# Patient Record
Sex: Female | Born: 1945 | Race: White | Hispanic: No | Marital: Married | Smoking: Never smoker
Health system: Southern US, Community
[De-identification: ages and names within clinical notes are randomized; demographics above are authoritative.]

## PROBLEM LIST (undated history)

## (undated) DIAGNOSIS — I2510 Atherosclerotic heart disease of native coronary artery without angina pectoris: Secondary | ICD-10-CM

## (undated) DIAGNOSIS — I82409 Acute embolism and thrombosis of unspecified deep veins of unspecified lower extremity: Secondary | ICD-10-CM

## (undated) DIAGNOSIS — E785 Hyperlipidemia, unspecified: Secondary | ICD-10-CM

## (undated) DIAGNOSIS — I10 Essential (primary) hypertension: Secondary | ICD-10-CM

## (undated) HISTORY — PX: CHOLECYSTECTOMY: SHX55

---

## 1994-04-25 HISTORY — PX: BREAST BIOPSY: SHX20

## 2005-01-09 ENCOUNTER — Other Ambulatory Visit: Payer: Self-pay

## 2005-01-09 ENCOUNTER — Inpatient Hospital Stay: Payer: Self-pay | Admitting: Internal Medicine

## 2005-01-10 ENCOUNTER — Other Ambulatory Visit: Payer: Self-pay

## 2005-01-26 ENCOUNTER — Ambulatory Visit: Payer: Self-pay | Admitting: Cardiovascular Disease

## 2005-06-14 ENCOUNTER — Ambulatory Visit: Payer: Self-pay | Admitting: Internal Medicine

## 2006-03-24 ENCOUNTER — Other Ambulatory Visit: Payer: Self-pay

## 2006-03-24 ENCOUNTER — Inpatient Hospital Stay: Payer: Self-pay | Admitting: Surgery

## 2006-06-22 ENCOUNTER — Ambulatory Visit: Payer: Self-pay | Admitting: Internal Medicine

## 2006-12-13 ENCOUNTER — Ambulatory Visit: Payer: Self-pay | Admitting: Gastroenterology

## 2007-06-26 ENCOUNTER — Ambulatory Visit: Payer: Self-pay | Admitting: Internal Medicine

## 2008-06-26 ENCOUNTER — Ambulatory Visit: Payer: Self-pay | Admitting: Internal Medicine

## 2009-08-25 ENCOUNTER — Ambulatory Visit: Payer: Self-pay | Admitting: Internal Medicine

## 2009-10-23 ENCOUNTER — Ambulatory Visit: Payer: Self-pay | Admitting: Internal Medicine

## 2009-10-24 ENCOUNTER — Inpatient Hospital Stay: Payer: Self-pay | Admitting: Internal Medicine

## 2010-04-21 ENCOUNTER — Inpatient Hospital Stay: Payer: Self-pay | Admitting: Internal Medicine

## 2010-04-25 HISTORY — PX: COLON SURGERY: SHX602

## 2010-06-09 ENCOUNTER — Ambulatory Visit: Payer: Self-pay | Admitting: Gastroenterology

## 2010-06-22 ENCOUNTER — Ambulatory Visit: Payer: Self-pay | Admitting: Emergency Medicine

## 2010-06-29 ENCOUNTER — Inpatient Hospital Stay: Payer: Self-pay | Admitting: Emergency Medicine

## 2010-06-30 LAB — PATHOLOGY REPORT

## 2010-09-09 ENCOUNTER — Ambulatory Visit: Payer: Self-pay | Admitting: Internal Medicine

## 2010-09-17 ENCOUNTER — Inpatient Hospital Stay: Payer: Self-pay | Admitting: Internal Medicine

## 2010-10-25 ENCOUNTER — Ambulatory Visit: Payer: Self-pay | Admitting: Internal Medicine

## 2010-10-27 ENCOUNTER — Emergency Department: Payer: Self-pay | Admitting: Emergency Medicine

## 2011-02-17 ENCOUNTER — Ambulatory Visit: Payer: Self-pay | Admitting: Internal Medicine

## 2011-04-18 ENCOUNTER — Ambulatory Visit: Payer: Self-pay | Admitting: Internal Medicine

## 2011-09-16 ENCOUNTER — Ambulatory Visit: Payer: Self-pay | Admitting: Internal Medicine

## 2011-11-16 ENCOUNTER — Ambulatory Visit: Payer: Self-pay | Admitting: Urology

## 2011-11-18 ENCOUNTER — Ambulatory Visit: Payer: Self-pay | Admitting: Internal Medicine

## 2011-11-18 LAB — RETICULOCYTES
Absolute Retic Count: 0.221 10*6/uL — ABNORMAL HIGH (ref 0.023–0.096)
Reticulocyte: 0.5 % (ref 0.5–2.2)

## 2011-11-18 LAB — CBC CANCER CENTER
Comment - H1-Com1: NORMAL
Eosinophil: 2 %
HCT: 38.5 % (ref 35.0–47.0)
Lymphocytes: 19 %
MCH: 29.5 pg (ref 26.0–34.0)
MCV: 84 fL (ref 80–100)
Monocytes: 6 %
Platelet: 144 x10 3/mm — ABNORMAL LOW (ref 150–440)
RBC: 4.56 10*6/uL (ref 3.80–5.20)
RDW: 12.8 % (ref 11.5–14.5)

## 2011-11-18 LAB — IRON AND TIBC
Iron Saturation: 27 %
Iron: 103 ug/dL (ref 50–170)
Unbound Iron-Bind.Cap.: 274 ug/dL

## 2011-11-18 LAB — FIBRINOGEN: Fibrinogen: 338 mg/dL (ref 210–470)

## 2011-11-18 LAB — APTT: Activated PTT: 44.9 secs — ABNORMAL HIGH (ref 23.6–35.9)

## 2011-11-18 LAB — PROTIME-INR: Prothrombin Time: 24.7 secs — ABNORMAL HIGH (ref 11.5–14.7)

## 2011-11-24 ENCOUNTER — Ambulatory Visit: Payer: Self-pay | Admitting: Internal Medicine

## 2011-12-30 ENCOUNTER — Ambulatory Visit: Payer: Self-pay | Admitting: Internal Medicine

## 2011-12-30 LAB — RETICULOCYTES
Absolute Retic Count: 0.038 10*6/uL (ref 0.023–0.096)
Reticulocyte: 0.9 % (ref 0.5–2.2)

## 2011-12-30 LAB — CBC CANCER CENTER
Basophil #: 0.1 x10 3/mm (ref 0.0–0.1)
Basophil %: 2.6 %
Eosinophil #: 0.1 x10 3/mm (ref 0.0–0.7)
HCT: 37.5 % (ref 35.0–47.0)
HGB: 12.3 g/dL (ref 12.0–16.0)
Lymphocyte %: 24.5 %
Monocyte %: 7.9 %
Neutrophil #: 2.9 x10 3/mm (ref 1.4–6.5)
Platelet: 168 x10 3/mm (ref 150–440)
RDW: 13.4 % (ref 11.5–14.5)
WBC: 4.7 x10 3/mm (ref 3.6–11.0)

## 2012-01-24 ENCOUNTER — Ambulatory Visit: Payer: Self-pay | Admitting: Internal Medicine

## 2012-09-18 ENCOUNTER — Ambulatory Visit: Payer: Self-pay | Admitting: Internal Medicine

## 2012-09-19 ENCOUNTER — Ambulatory Visit: Payer: Self-pay | Admitting: Internal Medicine

## 2012-11-23 ENCOUNTER — Emergency Department: Payer: Self-pay | Admitting: Internal Medicine

## 2012-11-23 LAB — URINALYSIS, COMPLETE
Glucose,UR: NEGATIVE mg/dL (ref 0–75)
Ketone: NEGATIVE
Ph: 6 (ref 4.5–8.0)
Protein: 100
Specific Gravity: 1.008 (ref 1.003–1.030)
Squamous Epithelial: 2
WBC UR: 487 /HPF (ref 0–5)

## 2013-03-26 ENCOUNTER — Ambulatory Visit: Payer: Self-pay | Admitting: Internal Medicine

## 2014-03-28 ENCOUNTER — Ambulatory Visit: Payer: Self-pay | Admitting: Internal Medicine

## 2015-03-23 ENCOUNTER — Other Ambulatory Visit: Payer: Self-pay | Admitting: Internal Medicine

## 2015-03-23 DIAGNOSIS — Z1231 Encounter for screening mammogram for malignant neoplasm of breast: Secondary | ICD-10-CM

## 2015-03-31 ENCOUNTER — Ambulatory Visit
Admission: RE | Admit: 2015-03-31 | Discharge: 2015-03-31 | Disposition: A | Discharge status: Home / Self Care | Payer: Medicare Other | Source: Ambulatory Visit | Attending: Internal Medicine | Admitting: Internal Medicine

## 2015-03-31 ENCOUNTER — Other Ambulatory Visit: Payer: Self-pay | Admitting: Internal Medicine

## 2015-03-31 DIAGNOSIS — Z1231 Encounter for screening mammogram for malignant neoplasm of breast: Secondary | ICD-10-CM

## 2016-02-18 ENCOUNTER — Other Ambulatory Visit: Payer: Self-pay | Admitting: Internal Medicine

## 2016-02-18 DIAGNOSIS — Z1231 Encounter for screening mammogram for malignant neoplasm of breast: Secondary | ICD-10-CM

## 2016-04-01 ENCOUNTER — Ambulatory Visit
Admission: RE | Admit: 2016-04-01 | Discharge: 2016-04-01 | Disposition: A | Discharge status: Home / Self Care | Payer: Medicare Other | Source: Ambulatory Visit | Attending: Internal Medicine | Admitting: Internal Medicine

## 2016-04-01 DIAGNOSIS — Z1231 Encounter for screening mammogram for malignant neoplasm of breast: Secondary | ICD-10-CM | POA: Diagnosis present

## 2016-09-30 ENCOUNTER — Emergency Department: Payer: Medicare Other

## 2016-09-30 ENCOUNTER — Encounter: Payer: Self-pay | Admitting: Emergency Medicine

## 2016-09-30 ENCOUNTER — Emergency Department
Admission: EM | Admit: 2016-09-30 | Discharge: 2016-09-30 | Disposition: A | Discharge status: Home / Self Care | Payer: Medicare Other | Attending: Emergency Medicine | Admitting: Emergency Medicine

## 2016-09-30 DIAGNOSIS — S6991XA Unspecified injury of right wrist, hand and finger(s), initial encounter: Secondary | ICD-10-CM | POA: Diagnosis present

## 2016-09-30 DIAGNOSIS — Z23 Encounter for immunization: Secondary | ICD-10-CM | POA: Insufficient documentation

## 2016-09-30 DIAGNOSIS — W19XXXA Unspecified fall, initial encounter: Secondary | ICD-10-CM

## 2016-09-30 DIAGNOSIS — Y929 Unspecified place or not applicable: Secondary | ICD-10-CM | POA: Insufficient documentation

## 2016-09-30 DIAGNOSIS — W010XXA Fall on same level from slipping, tripping and stumbling without subsequent striking against object, initial encounter: Secondary | ICD-10-CM | POA: Insufficient documentation

## 2016-09-30 DIAGNOSIS — Y998 Other external cause status: Secondary | ICD-10-CM | POA: Insufficient documentation

## 2016-09-30 DIAGNOSIS — Y9389 Activity, other specified: Secondary | ICD-10-CM | POA: Diagnosis not present

## 2016-09-30 DIAGNOSIS — S60511A Abrasion of right hand, initial encounter: Secondary | ICD-10-CM | POA: Diagnosis not present

## 2016-09-30 DIAGNOSIS — I10 Essential (primary) hypertension: Secondary | ICD-10-CM | POA: Diagnosis not present

## 2016-09-30 DIAGNOSIS — I2510 Atherosclerotic heart disease of native coronary artery without angina pectoris: Secondary | ICD-10-CM | POA: Insufficient documentation

## 2016-09-30 DIAGNOSIS — M7918 Myalgia, other site: Secondary | ICD-10-CM

## 2016-09-30 HISTORY — DX: Atherosclerotic heart disease of native coronary artery without angina pectoris: I25.10

## 2016-09-30 HISTORY — DX: Essential (primary) hypertension: I10

## 2016-09-30 HISTORY — DX: Acute embolism and thrombosis of unspecified deep veins of unspecified lower extremity: I82.409

## 2016-09-30 MED ORDER — TETANUS-DIPHTH-ACELL PERTUSSIS 5-2.5-18.5 LF-MCG/0.5 IM SUSP
0.5000 mL | Freq: Once | INTRAMUSCULAR | Status: AC
  Administered 2016-09-30: 0.5 mL via INTRAMUSCULAR
  Filled 2016-09-30: qty 0.5

## 2016-09-30 NOTE — ED Triage Notes (Signed)
Fell from outside porch after tripping over the cat.  C/O right leg pain and right hand laceration.  Bleeding controlled.  Patient fell onto concrete patio.  Ambulatory.  Gait steady. Posture upright and relaxed.

## 2016-09-30 NOTE — ED Provider Notes (Signed)
[facility] Emergency Department Provider Note  ____________________________________________  Time seen: Approximately 5:21 PM  I have reviewed the triage vital signs and the nursing notes.   HISTORY  Chief Complaint Fall    HPI Sherry Olson is a 71 y.o. female that presents to the emergency department with right hip pain, right shoulder pain, and right wrist pain after tripping on her cat and falling.She has been walking normally since fall. She has an abrasion to her right hand. She did not hit her head or lose consciousness. She does take a blood thinner for a previous DVT in her left leg. She denies headache, shortness of breath, chest pain, nausea, vomiting, abdominal pain, back pain.   Past Medical History:  Diagnosis Date  . Coronary artery disease   . DVT (deep venous thrombosis) (HCC)   . Hypertension     There are no active problems to display for this patient.   Past Surgical History:  Procedure Laterality Date  . BREAST BIOPSY Right 1996   negative    Prior to Admission medications   Not on File    Allergies Amiodarone  No family history on file.  Social History Social History  Substance Use Topics  . Smoking status: Never Smoker  . Smokeless tobacco: Never Used  . Alcohol use Not on file     Review of Systems  Constitutional: No fever/chills Cardiovascular: No chest pain. Respiratory: No SOB. Gastrointestinal: No abdominal pain.  No nausea, no vomiting.  Skin: Negative for rash, lacerations, ecchymosis. Positive for abrasion. Neurological: Negative for headaches, numbness or tingling   ____________________________________________   PHYSICAL EXAM:  VITAL SIGNS: ED Triage Vitals [09/30/16 1555]  Enc Vitals Group     BP      Pulse      Resp      Temp      Temp src      SpO2      Weight 134 lb (60.8 kg)     Height 5\' 3"  (1.6 m)     Head Circumference      Peak Flow      Pain Score 4     Pain Loc       Pain Edu?      Excl. in GC?      Constitutional: Alert and oriented. Well appearing and in no acute distress. Eyes: Conjunctivae are normal. PERRL. EOMI. Head: Atraumatic. ENT:      Ears:      Nose: No congestion/rhinnorhea.      Mouth/Throat: Mucous membranes are moist.  Neck: No stridor.  Cardiovascular: Normal rate, regular rhythm.  Good peripheral circulation. Respiratory: Normal respiratory effort without tachypnea or retractions. Lungs CTAB. Good air entry to the bases with no decreased or absent breath sounds. Gastrointestinal: Bowel sounds 4 quadrants. Soft and nontender to palpation. No guarding or rigidity. No palpable masses. No distention.  Musculoskeletal: Full range of motion to all extremities. No gross deformities appreciated. Neurologic:  Normal speech and language. No gross focal neurologic deficits are appreciated.  Skin:  Skin is warm, dry. 1/2 and 1/4 cm abrasions to dorsal side of right hand.   ____________________________________________   LABS (all labs ordered are listed, but only abnormal results are displayed)  Labs Reviewed - No data to display ____________________________________________  EKG   ____________________________________________  RADIOLOGY Lexine BatonI, Loralye Loberg, personally viewed and evaluated these images (plain radiographs) as part of my medical decision making, as well as reviewing the written report by the  radiologist.  Dg Shoulder Right  Result Date: 09/30/2016 CLINICAL DATA:  Larey Seat on front porch today onto RIGHT side injuring RIGHT shoulder, RIGHT wrist and RIGHT hip, diffuse pain at each of those sites, initial encounter EXAM: RIGHT SHOULDER - 2+ VIEW COMPARISON:  None FINDINGS: Osseous demineralization. AC joint alignment normal. No acute fracture, dislocation, or bone destruction. Visualized RIGHT ribs intact. IMPRESSION: No acute osseous abnormalities. Electronically Signed   By: Ulyses Southward M.D.   On: 09/30/2016 17:19   Dg Wrist  Complete Right  Result Date: 09/30/2016 CLINICAL DATA:  Larey Seat on front porch today onto RIGHT side injuring RIGHT shoulder, RIGHT wrist and RIGHT hip, diffuse pain at each of those sites, initial encounter EXAM: RIGHT WRIST - COMPLETE 3+ VIEW COMPARISON:  None FINDINGS: Osseous demineralization. Joint space narrowing at STT joint. Minimal degenerative changes first CMC joint as well. No acute fracture, dislocation, or bone destruction. IMPRESSION: Osseous demineralization with mild degenerative changes at the radial border of the carpus. No acute abnormalities. Electronically Signed   By: Ulyses Southward M.D.   On: 09/30/2016 17:19   Dg Hip Unilat W Or Wo Pelvis 2-3 Views Right  Result Date: 09/30/2016 CLINICAL DATA:  Larey Seat on front porch today onto RIGHT side injuring RIGHT shoulder, RIGHT wrist and RIGHT hip, diffuse pain at each of those sites, initial encounter EXAM: DG HIP (WITH OR WITHOUT PELVIS) 2-3V RIGHT COMPARISON:  None FINDINGS: Hip and SI joint spaces symmetric. Bones appear demineralized. No acute fracture, dislocation, or bone destruction. Disc space narrowing L4-L5. Anastomotic staple lines in the pelvis. IMPRESSION: No acute RIGHT hip abnormalities. Electronically Signed   By: Ulyses Southward M.D.   On: 09/30/2016 17:20    ____________________________________________    PROCEDURES  Procedure(s) performed:    Procedures  Abrasions were cleaned with normal saline and iodine. Dermabond was applied. Hand was wrapped.  Medications  Tdap (BOOSTRIX) injection 0.5 mL (0.5 mLs Intramuscular Given 09/30/16 1742)     ____________________________________________   INITIAL IMPRESSION / ASSESSMENT AND PLAN / ED COURSE  Pertinent labs & imaging results that were available during my care of the patient were reviewed by me and considered in my medical decision making (see chart for details).  Review of the [place] CSRS was performed in accordance of the [facility] prior to dispensing any controlled  drugs.   Patient's diagnosis is consistent with musculoskeletal pain and abrasion after fall. Vital signs and exam are reassuring. Right shoulder, right wrist, right hip x-rays negative for acute bony abnormalities. Patient did not hit head or lose consciousness. Abrasion was repaired with dermabond. Tetanus shot was updated. Patient is to follow up with PCP as directed. Patient is given ED precautions to return to the ED for any worsening or new symptoms.     ____________________________________________  FINAL CLINICAL IMPRESSION(S) / ED DIAGNOSES  Final diagnoses:  Fall, initial encounter  Abrasion of right hand, initial encounter  Musculoskeletal pain      NEW MEDICATIONS STARTED DURING THIS VISIT:  There are no discharge medications for this patient.       This chart was dictated using voice recognition software/Dragon. Despite best efforts to proofread, errors can occur which can change the meaning. Any change was purely unintentional.    Enid Derry, PA-C 09/30/16 1841    Jene Every, MD 10/04/16 [phone]

## 2016-09-30 NOTE — ED Notes (Signed)
Pt reports that she fell today when she tripped over the cat and fell on the porch - pt hit cement - she injured right hand/elbow/leg - pt is able to bear weight

## 2017-02-20 ENCOUNTER — Other Ambulatory Visit: Payer: Self-pay | Admitting: Internal Medicine

## 2017-02-20 DIAGNOSIS — Z1231 Encounter for screening mammogram for malignant neoplasm of breast: Secondary | ICD-10-CM

## 2017-04-27 ENCOUNTER — Ambulatory Visit
Admission: RE | Admit: 2017-04-27 | Discharge: 2017-04-27 | Disposition: A | Discharge status: Home / Self Care | Payer: Medicare Other | Source: Ambulatory Visit | Attending: Internal Medicine | Admitting: Internal Medicine

## 2017-04-27 DIAGNOSIS — Z1231 Encounter for screening mammogram for malignant neoplasm of breast: Secondary | ICD-10-CM | POA: Insufficient documentation

## 2017-07-06 ENCOUNTER — Other Ambulatory Visit: Payer: Self-pay | Admitting: Internal Medicine

## 2017-07-06 DIAGNOSIS — O926 Galactorrhea: Secondary | ICD-10-CM

## 2017-07-10 ENCOUNTER — Other Ambulatory Visit: Payer: Medicare Other

## 2017-07-11 ENCOUNTER — Ambulatory Visit
Admission: RE | Admit: 2017-07-11 | Discharge: 2017-07-11 | Disposition: A | Discharge status: Home / Self Care | Payer: Medicare Other | Source: Ambulatory Visit | Attending: Internal Medicine | Admitting: Internal Medicine

## 2017-07-11 DIAGNOSIS — O926 Galactorrhea: Secondary | ICD-10-CM

## 2017-07-11 DIAGNOSIS — N643 Galactorrhea not associated with childbirth: Secondary | ICD-10-CM | POA: Insufficient documentation

## 2017-10-20 ENCOUNTER — Other Ambulatory Visit: Payer: Self-pay | Admitting: Internal Medicine

## 2017-10-20 DIAGNOSIS — I82402 Acute embolism and thrombosis of unspecified deep veins of left lower extremity: Secondary | ICD-10-CM

## 2017-10-25 ENCOUNTER — Ambulatory Visit
Admission: RE | Admit: 2017-10-25 | Discharge: 2017-10-25 | Disposition: A | Discharge status: Home / Self Care | Payer: Medicare Other | Source: Ambulatory Visit | Attending: Internal Medicine | Admitting: Internal Medicine

## 2017-10-25 DIAGNOSIS — I82402 Acute embolism and thrombosis of unspecified deep veins of left lower extremity: Secondary | ICD-10-CM | POA: Insufficient documentation

## 2017-11-10 ENCOUNTER — Ambulatory Visit (INDEPENDENT_AMBULATORY_CARE_PROVIDER_SITE_OTHER): Payer: Medicare Other | Admitting: Vascular Surgery

## 2017-11-10 ENCOUNTER — Encounter (INDEPENDENT_AMBULATORY_CARE_PROVIDER_SITE_OTHER): Payer: Self-pay | Admitting: Vascular Surgery

## 2017-11-10 VITALS — BP 112/64 | HR 63 | Resp 16 | Ht 63.0 in | Wt 132.0 lb

## 2017-11-10 DIAGNOSIS — I82409 Acute embolism and thrombosis of unspecified deep veins of unspecified lower extremity: Secondary | ICD-10-CM | POA: Insufficient documentation

## 2017-11-10 DIAGNOSIS — I82512 Chronic embolism and thrombosis of left femoral vein: Secondary | ICD-10-CM

## 2017-11-10 DIAGNOSIS — I10 Essential (primary) hypertension: Secondary | ICD-10-CM

## 2017-11-10 NOTE — Progress Notes (Signed)
Patient ID: Sherry Olson, female   DOB: 05/31/1945, 72 y.o.   MRN: [id number]  Chief Complaint  Patient presents with  . New Patient (Initial Visit)    history of DVT    HPI Sherry Olson is a 72 y.o. female.  Patient present for evaluation of DVT.  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  She has been on Coumadin since that time.  She also had a provoked DVT about 45 years ago with pregnancy.  She does have a family history of DVT with a father and an aunt who had a DVT many years ago.  She denies any current leg symptoms.  She is on a diuretic but has no leg swelling or pain.  She wears her stockings daily.  No chest pain or shortness of breath.  She has had some bleeding difficulties from her breast and had to have a mammogram to evaluate this earlier this year.  A recent venous duplex revealed no evidence of acute DVT with only chronic scarring in the left common femoral vein as sequela from her previous blood clot.  Current Outpatient Medications  Medication Sig Dispense Refill  . amLODipine (NORVASC) 5 MG tablet Take 5 mg by mouth daily.    Marland Kitchen. atorvastatin (LIPITOR) 10 MG tablet Take 10 mg by mouth daily.    . benazepril (LOTENSIN) 20 MG tablet Take 20 mg by mouth daily.    . Calcium Carbonate (CALCIUM 600 PO) Take by mouth daily.    . cholecalciferol (VITAMIN D) 1000 units tablet Take 1,000 Units by mouth daily.    Marland Kitchen. CITALOPRAM HYDROBROMIDE PO Take by mouth.    . dicyclomine (BENTYL) 10 MG capsule Take 10 mg by mouth 4 (four) times daily -  before meals and at bedtime.    Marland Kitchen. FLUTICASONE PROPIONATE EX Apply topically.    . furosemide (LASIX) 20 MG tablet Take 20 mg by mouth daily.    Marland Kitchen. LORazepam (ATIVAN) 0.5 MG tablet Take 0.5 mg by mouth as needed for anxiety.    . methylPREDNISolone (MEDROL DOSEPAK) 4 MG TBPK tablet Take 4 mg by mouth daily.    Marland Kitchen. METOPROLOL SUCCINATE PO Take by mouth.    Marland Kitchen. omeprazole (PRILOSEC) 40 MG capsule Take 40 mg by mouth  daily.    Marland Kitchen. warfarin (COUMADIN) 1 MG tablet Take 1 mg by mouth daily.    Marland Kitchen. warfarin (COUMADIN) 4 MG tablet Take 4 mg by mouth daily.     No current facility-administered medications for this visit.      Past Medical History:  Diagnosis Date  . Coronary artery disease   . DVT (deep venous thrombosis) (HCC)   . Hypertension     Past Surgical History:  Procedure Laterality Date  . BREAST BIOPSY Right 1996   negative    Family History  Problem Relation Age of Onset  . Hypertension Father   . Deep vein thrombosis Father   . Heart attack Father   no bleeding disorder No aneurysms  Social History Social History   Tobacco Use  . Smoking status: Never Smoker  . Smokeless tobacco: Never Used  Substance Use Topics  . Alcohol use: Never    Frequency: Never  . Drug use: Never     Allergies  Allergen Reactions  . Amiodarone   . Welchol [Colesevelam Hcl] Diarrhea and Nausea And Vomiting        REVIEW OF SYSTEMS (Negative unless checked)  Constitutional: [] Weight loss  [] Fever  [] Chills Cardiac: [] Chest pain   [] Chest pressure   [] Palpitations   [] Shortness of breath when laying flat   [] Shortness of breath at rest   [] Shortness of breath with exertion. Vascular:  [] Pain in legs with walking   [] Pain in legs at rest   [] Pain in legs when laying flat   [] Claudication   [] Pain in feet when walking  [] Pain in feet at rest  [] Pain in feet when laying flat   [x] History of DVT   [] Phlebitis   [x] Swelling in legs   [] Varicose veins   [] Non-healing ulcers Pulmonary:   [] Uses home oxygen   [] Productive cough   [] Hemoptysis   [] Wheeze  [] COPD   [] Asthma Neurologic:  [] Dizziness  [] Blackouts   [] Seizures   [] History of stroke   [] History of TIA  [] Aphasia   [] Temporary blindness   [] Dysphagia   [] Weakness or numbness in arms   [] Weakness or numbness in legs Musculoskeletal:  [] Arthritis   [] Joint swelling   [] Joint pain   [] Low back pain Hematologic:  [] Easy bruising  [] Easy bleeding    [] Hypercoagulable state   [] Anemic  [] Hepatitis  Gastrointestinal:  [] Blood in stool   [] Vomiting blood  [] Gastroesophageal reflux/heartburn   [] Difficulty swallowing   Genitourinary:  [] Chronic kidney disease   [] Difficult urination  [] Frequent urination  [] Burning with urination   [] Blood in urine Skin:  [] Rashes   [] Ulcers   [] Wounds Psychological:  [] History of anxiety   []  History of major depression.  Physical Exam BP 112/64 (BP Location: Right Arm)   Pulse 63   Resp 16   Ht 5\' 3"  (1.6 m)   Wt 132 lb (59.9 kg)   BMI 23.38 kg/m   Gen:  WD/WN, NAD.  Appears younger than stated age Head: [place]/AT, No temporalis wasting.  Ear/Nose/Throat: Hearing grossly intact, nares w/o erythema or drainage, oropharynx w/o Erythema/Exudate Eyes: Sclera non-icteric, conjunctiva clear Neck: Trachea midline.  No JVD.  Pulmonary:  Good air movement, no use of accessory muscles.  Cardiac: RRR, normal S1, S2. Vascular:  Vessel Right Left  Radial Palpable Palpable                          PT Palpable Palpable  DP Palpable Palpable   Gastrointestinal: soft, non-tender/non-distended. No guarding/reflex.  Musculoskeletal: M/S 5/5 throughout.  Extremities without ischemic changes.  No deformity or atrophy.  No edema.  Neurologic:  Sensation grossly intact in extremities.  Symmetrical.  Speech is fluent. Motor exam as listed above. Psychiatric: Judgment intact, Mood & affect appropriate for pt's clinical situation. Dermatologic: No rashes or ulcers noted.  No cellulitis or open wounds.    Radiology US Venous Img Lower Unilateral Left  Result Date: 10/25/2017 CLINICAL DATA:  72 year old female with a history of DVT. EXAM: LEFT LOWER EXTREMITY VENOUS DOPPLER ULTRASOUND TECHNIQUE: Gray-scale sonography with graded compression, as well as color Doppler and duplex ultrasound were performed to evaluate the lower extremity deep venous systems from the level of the common femoral vein and including the  common femoral, femoral, profunda femoral, popliteal and calf veins including the posterior tibial, peroneal and gastrocnemius veins when visible. The superficial great saphenous vein was also interrogated. Spectral Doppler was utilized to evaluate flow at rest and with distal augmentation maneuvers in the common femoral, femoral and popliteal veins. COMPARISON:  09/17/2010 FINDINGS: Contralateral Common Femoral Vein: Respiratory phasicity is normal and symmetric with the symptomatic side. No evidence of  thrombus. Normal compressibility. Common Femoral Vein: No evidence of thrombus. Incomplete compressibility., flow and respiratory phasicity maintained. Wall thickening likely represents chronic thrombus/scar. Saphenofemoral Junction: No thrombus identified. Incomplete compressibility. Flow and respiratory phasicity maintained. Profunda Femoral Vein: No evidence of thrombus. Normal compressibility and flow on color Doppler imaging. Femoral Vein: No evidence of thrombus. Normal compressibility, respiratory phasicity and response to augmentation. Popliteal Vein: No evidence of thrombus. Normal compressibility, respiratory phasicity and response to augmentation. Calf Veins: No evidence of thrombus. Normal compressibility and flow on color Doppler imaging. Superficial Great Saphenous Vein: No evidence of thrombus. Normal compressibility and flow on color Doppler imaging. Other Findings:  None. IMPRESSION: Sonographic survey of the left lower extremity negative for acute DVT. Thickening of the left common femoral vein wall with incomplete compressibility likely reflects chronic changes of prior DVT. Electronically Signed   By: Gilmer Mor D.O.   On: 10/25/2017 15:46    Labs No results found for this or any previous visit (from the past 2160 hour(s)).  Assessment/Plan:  Hypertension blood pressure control important in reducing the progression of atherosclerotic disease. On appropriate oral medications.   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  She is received adequate therapy with full anticoagulation, and at this point it would certainly be reasonable to switch to an aspirin daily.  She should continue wear compression stockings and elevate her legs.  Given her bleeding issues from her breast, I think stopping anticoagulation at this time would be in her best interest.  She has a small but real risk of recurrent DVT and she needs to be cognizant and aware if she has any leg swelling to consider a venous duplex.  I will see her back as needed.     Festus Barren 11/10/2017, 12:19 PM   This note was created with [facility] medical dictation system.  Any errors from dictation are unintentional.

## 2017-11-10 NOTE — Assessment & Plan Note (Signed)
blood pressure control important in reducing the progression of atherosclerotic disease. On appropriate oral medications.  

## 2017-11-10 NOTE — Assessment & Plan Note (Signed)
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  She is received adequate therapy with full anticoagulation, and at this point it would certainly be reasonable to switch to an aspirin daily.  She should continue wear compression stockings and elevate her legs.  Given her bleeding issues from her breast, I think stopping anticoagulation at this time would be in her best interest.  She has a small but real risk of recurrent DVT and she needs to be cognizant and aware if she has any leg swelling to consider a venous duplex.  I will see her back as needed.

## 2018-03-27 ENCOUNTER — Other Ambulatory Visit: Payer: Self-pay | Admitting: Internal Medicine

## 2018-03-27 DIAGNOSIS — Z1231 Encounter for screening mammogram for malignant neoplasm of breast: Secondary | ICD-10-CM

## 2018-04-30 ENCOUNTER — Ambulatory Visit
Admission: RE | Admit: 2018-04-30 | Discharge: 2018-04-30 | Disposition: A | Discharge status: Home / Self Care | Payer: Medicare Other | Source: Ambulatory Visit | Attending: Internal Medicine | Admitting: Internal Medicine

## 2018-04-30 DIAGNOSIS — Z1231 Encounter for screening mammogram for malignant neoplasm of breast: Secondary | ICD-10-CM | POA: Diagnosis present

## 2018-11-23 ENCOUNTER — Other Ambulatory Visit: Payer: Medicare Other | Attending: Internal Medicine

## 2018-12-13 ENCOUNTER — Emergency Department: Payer: Medicare Other

## 2018-12-13 ENCOUNTER — Emergency Department
Admission: EM | Admit: 2018-12-13 | Discharge: 2018-12-13 | Disposition: A | Discharge status: Home / Self Care | Payer: Medicare Other | Attending: Emergency Medicine | Admitting: Emergency Medicine

## 2018-12-13 ENCOUNTER — Other Ambulatory Visit: Payer: Self-pay

## 2018-12-13 DIAGNOSIS — Y92812 Truck as the place of occurrence of the external cause: Secondary | ICD-10-CM | POA: Diagnosis not present

## 2018-12-13 DIAGNOSIS — I259 Chronic ischemic heart disease, unspecified: Secondary | ICD-10-CM | POA: Diagnosis not present

## 2018-12-13 DIAGNOSIS — I10 Essential (primary) hypertension: Secondary | ICD-10-CM | POA: Insufficient documentation

## 2018-12-13 DIAGNOSIS — Z79899 Other long term (current) drug therapy: Secondary | ICD-10-CM | POA: Insufficient documentation

## 2018-12-13 DIAGNOSIS — I82409 Acute embolism and thrombosis of unspecified deep veins of unspecified lower extremity: Secondary | ICD-10-CM | POA: Insufficient documentation

## 2018-12-13 DIAGNOSIS — Y9389 Activity, other specified: Secondary | ICD-10-CM | POA: Diagnosis not present

## 2018-12-13 DIAGNOSIS — Z7901 Long term (current) use of anticoagulants: Secondary | ICD-10-CM | POA: Diagnosis not present

## 2018-12-13 DIAGNOSIS — S50312A Abrasion of left elbow, initial encounter: Secondary | ICD-10-CM | POA: Insufficient documentation

## 2018-12-13 DIAGNOSIS — S0990XA Unspecified injury of head, initial encounter: Secondary | ICD-10-CM | POA: Diagnosis present

## 2018-12-13 DIAGNOSIS — W1789XA Other fall from one level to another, initial encounter: Secondary | ICD-10-CM | POA: Insufficient documentation

## 2018-12-13 DIAGNOSIS — W19XXXA Unspecified fall, initial encounter: Secondary | ICD-10-CM

## 2018-12-13 DIAGNOSIS — Y999 Unspecified external cause status: Secondary | ICD-10-CM | POA: Diagnosis not present

## 2018-12-13 MED ORDER — TRAMADOL HCL 50 MG PO TABS: 50.0000 mg | ORAL_TABLET | Freq: Four times a day (QID) | ORAL | 0 refills | Status: AC | PRN

## 2018-12-13 NOTE — ED Provider Notes (Signed)
[facility] Emergency Department Provider Note  ____________________________________________  Time seen: Approximately 10:02 PM  I have reviewed the triage vital signs and the nursing notes.   HISTORY  Chief Complaint Arm Pain    HPI Sherry Olson is a 73 y.o. female with a history of coronary artery disease and hypertension, presents to the emergency department after a fall.  Patient reports that she fell off the back of a truck from a sitting position.  She fell as she was trying to evade dogs that were running up to her.  She reports left elbow pain and overlying abrasion of left elbow.  She denies hitting her head or neck during fall.  She denies vertigo, nausea or current headache.  Patient does state that she has some left lateral thigh pain but has had no difficulty walking.  No numbness or tingling in the upper or lower extremities or subjective weakness.  Her tetanus status is up-to-date.  No other alleviating measures have been attempted.        Past Medical History:  Diagnosis Date  . Coronary artery disease   . DVT (deep venous thrombosis) ([place])   . Hypertension     Patient Active Problem List   Diagnosis Date Noted  . Hypertension 11/10/2017  . DVT (deep venous thrombosis) ([place]) 11/10/2017    Past Surgical History:  Procedure Laterality Date  . BREAST BIOPSY Right 1996   negative    Prior to Admission medications   Medication Sig Start Date End Date Taking? Authorizing Provider  amLODipine (NORVASC) 5 MG tablet Take 5 mg by mouth daily.    [provider]  atorvastatin (LIPITOR) 10 MG tablet Take 10 mg by mouth daily.    [provider]  benazepril (LOTENSIN) 20 MG tablet Take 20 mg by mouth daily.    [provider]  Calcium Carbonate (CALCIUM 600 PO) Take by mouth daily.    [provider]  cholecalciferol (VITAMIN D) 1000 units tablet Take 1,000 Units by mouth daily.    [provider]   CITALOPRAM HYDROBROMIDE PO Take by mouth.    [provider]  dicyclomine (BENTYL) 10 MG capsule Take 10 mg by mouth 4 (four) times daily -  before meals and at bedtime.    [provider]  FLUTICASONE PROPIONATE EX Apply topically.    [provider]  furosemide (LASIX) 20 MG tablet Take 20 mg by mouth daily.    [provider]  LORazepam (ATIVAN) 0.5 MG tablet Take 0.5 mg by mouth as needed for anxiety.    [provider]  methylPREDNISolone (MEDROL DOSEPAK) 4 MG TBPK tablet Take 4 mg by mouth daily.    [provider]  METOPROLOL SUCCINATE PO Take by mouth.    [provider]  omeprazole (PRILOSEC) 40 MG capsule Take 40 mg by mouth daily.    [provider]  traMADol (ULTRAM) 50 MG tablet Take 1 tablet (50 mg total) by mouth every 6 (six) hours as needed for up to 3 days. 12/13/18 12/16/18  Lannie Fields, PA-C  warfarin (COUMADIN) 1 MG tablet Take 1 mg by mouth daily.    [provider]  warfarin (COUMADIN) 4 MG tablet Take 4 mg by mouth daily.    [provider]    Allergies Amiodarone and Welchol [colesevelam hcl]  Family History  Problem Relation Age of Onset  . Hypertension Father   . Deep vein thrombosis Father   . Heart attack  Father     Social History Social History   Tobacco Use  . Smoking status: Never Smoker  . Smokeless tobacco: Never Used  Substance Use Topics  . Alcohol use: Never    Frequency: Never  . Drug use: Never     Review of Systems  Constitutional: No fever/chills Eyes: No visual changes. No discharge ENT: No upper respiratory complaints. Cardiovascular: no chest pain. Respiratory: no cough. No SOB. Gastrointestinal: No abdominal pain.  No nausea, no vomiting.  No diarrhea.  No constipation. Musculoskeletal: Patient has left elbow pain. Skin: Patient has abrasion of left elbow. Neurological: Negative for headaches, focal weakness or  numbness.  ____________________________________________   PHYSICAL EXAM:  VITAL SIGNS: ED Triage Vitals  Enc Vitals Group     BP 12/13/18 1951 (!) 150/50     Pulse Rate 12/13/18 1951 65     Resp 12/13/18 1951 18     Temp 12/13/18 1951 98.9 F (37.2 C)     Temp Source 12/13/18 1951 Oral     SpO2 12/13/18 1951 96 %     Weight 12/13/18 1956 132 lb (59.9 kg)     Height 12/13/18 1956 5\' 3"  (1.6 m)     Head Circumference --      Peak Flow --      Pain Score 12/13/18 1956 7     Pain Loc --      Pain Edu? --      Excl. in GC? --      Constitutional: Alert and oriented. Well appearing and in no acute distress. Eyes: Conjunctivae are normal. PERRL. EOMI. Head: Atraumatic. ENT:      Nose: No congestion/rhinnorhea.      Mouth/Throat: Mucous membranes are moist.  Neck: No stridor.  No cervical spine tenderness to palpation.  Cardiovascular: Normal rate, regular rhythm. Normal S1 and S2.  Good peripheral circulation. Respiratory: Normal respiratory effort without tachypnea or retractions. Lungs CTAB. Good air entry to the bases with no decreased or absent breath sounds. Gastrointestinal: Bowel sounds 4 quadrants. Soft and nontender to palpation. No guarding or rigidity. No palpable masses. No distention. No CVA tenderness. Musculoskeletal: Full range of motion to all extremities. No gross deformities appreciated. Neurologic:  Normal speech and language. No gross focal neurologic deficits are appreciated.  Skin: Patient has small abrasion overlying left elbow. Psychiatric: Mood and affect are normal. Speech and behavior are normal. Patient exhibits appropriate insight and judgement.   ____________________________________________   LABS (all labs ordered are listed, but only abnormal results are displayed)  Labs Reviewed - No data to display ____________________________________________  EKG   ____________________________________________  RADIOLOGY I personally viewed and  evaluated these images as part of my medical decision making, as well as reviewing the written report by the radiologist.  Dg Elbow Complete Left  Result Date: 12/13/2018 CLINICAL DATA:  Fall with elbow pain EXAM: LEFT ELBOW - COMPLETE 3+ VIEW COMPARISON:  None. FINDINGS: No fracture or malalignment. No significant fat pad distention to suggest elbow effusion. IMPRESSION: No acute osseous abnormality Electronically Signed   By: Jasmine PangKim  Fujinaga M.D.   On: 12/13/2018 20:24   Ct Head Wo Contrast  Result Date: 12/13/2018 CLINICAL DATA:  Head trauma. EXAM: CT HEAD WITHOUT CONTRAST TECHNIQUE: Contiguous axial images were obtained from the base of the skull through the vertex without intravenous contrast. COMPARISON:  None. FINDINGS: Brain: No evidence of acute infarction, hemorrhage, hydrocephalus, extra-axial collection or mass lesion/mass effect. Vascular: No hyperdense vessel or unexpected calcification. Skull: Normal.  Negative for fracture or focal lesion. Sinuses/Orbits: No acute finding. Other: None. IMPRESSION: No acute intracranial abnormality Electronically Signed   By: Katherine Mantlehristopher  Green M.D.   On: 12/13/2018 22:25    ____________________________________________    PROCEDURES  Procedure(s) performed:    Procedures    Medications - No data to display   ____________________________________________   INITIAL IMPRESSION / ASSESSMENT AND PLAN / ED COURSE  Pertinent labs & imaging results that were available during my care of the patient were reviewed by me and considered in my medical decision making (see chart for details).  Review of the [place] CSRS was performed in accordance of the [facility] prior to dispensing any controlled drugs.         Assessment and plan Fall Left elbow pain 73 year old female presents to the emergency department with left elbow pain after a fall.  Patient was hypertensive at triage but vital signs were otherwise reassuring.  Neuro exam was within the  parameters are normal and appropriate for age.  Differential diagnosis includes subdural hematoma, subarachnoid hemorrhage, left elbow fracture and abrasion.  CT head revealed no evidence of subdural hematoma or skull fracture.  X-ray examination of the left elbow revealed no acute bony abnormality.  Basic wound care was provided for abrasion overlying left elbow.  Patient was discharged with a short course of tramadol for pain.  She reports that her tetanus status is up-to-date.  All patient questions were answered.   ____________________________________________  FINAL CLINICAL IMPRESSION(S) / ED DIAGNOSES  Final diagnoses:  Fall, initial encounter      NEW MEDICATIONS STARTED DURING THIS VISIT:  ED Discharge Orders         Ordered    traMADol (ULTRAM) 50 MG tablet  Every 6 hours PRN     12/13/18 2240              This chart was dictated using voice recognition software/Dragon. Despite best efforts to proofread, errors can occur which can change the meaning. Any change was purely unintentional.    Orvil FeilWoods, Kloie Whiting M, PA-C 12/13/18 2246    Concha SeFunke, Mary E, MD 12/14/18 [phone]

## 2018-12-13 NOTE — ED Triage Notes (Signed)
Patient left elbow pain, swelling, and abrasion after falling out of back of non-moving truck.

## 2019-03-25 ENCOUNTER — Other Ambulatory Visit: Payer: Self-pay | Admitting: Internal Medicine

## 2019-03-25 DIAGNOSIS — Z1231 Encounter for screening mammogram for malignant neoplasm of breast: Secondary | ICD-10-CM

## 2019-05-02 ENCOUNTER — Ambulatory Visit
Admission: RE | Admit: 2019-05-02 | Discharge: 2019-05-02 | Disposition: A | Discharge status: Home / Self Care | Payer: Medicare Other | Source: Ambulatory Visit | Attending: Internal Medicine | Admitting: Internal Medicine

## 2019-05-02 DIAGNOSIS — Z1231 Encounter for screening mammogram for malignant neoplasm of breast: Secondary | ICD-10-CM | POA: Insufficient documentation

## 2020-02-21 ENCOUNTER — Other Ambulatory Visit: Payer: Self-pay | Admitting: Internal Medicine

## 2020-02-21 DIAGNOSIS — Z1231 Encounter for screening mammogram for malignant neoplasm of breast: Secondary | ICD-10-CM

## 2020-05-04 ENCOUNTER — Other Ambulatory Visit: Payer: Self-pay

## 2020-05-04 ENCOUNTER — Ambulatory Visit
Admission: RE | Admit: 2020-05-04 | Discharge: 2020-05-04 | Disposition: A | Discharge status: Home / Self Care | Payer: Medicare Other | Source: Ambulatory Visit | Attending: Internal Medicine | Admitting: Internal Medicine

## 2020-05-04 DIAGNOSIS — Z1231 Encounter for screening mammogram for malignant neoplasm of breast: Secondary | ICD-10-CM | POA: Diagnosis present

## 2020-10-20 DIAGNOSIS — E782 Mixed hyperlipidemia: Secondary | ICD-10-CM | POA: Diagnosis not present

## 2020-10-20 DIAGNOSIS — I10 Essential (primary) hypertension: Secondary | ICD-10-CM | POA: Diagnosis not present

## 2020-10-30 DIAGNOSIS — E782 Mixed hyperlipidemia: Secondary | ICD-10-CM | POA: Diagnosis not present

## 2020-10-30 DIAGNOSIS — I10 Essential (primary) hypertension: Secondary | ICD-10-CM | POA: Diagnosis not present

## 2020-10-30 DIAGNOSIS — K219 Gastro-esophageal reflux disease without esophagitis: Secondary | ICD-10-CM | POA: Diagnosis not present

## 2020-11-03 DIAGNOSIS — H35372 Puckering of macula, left eye: Secondary | ICD-10-CM | POA: Diagnosis not present

## 2020-11-03 DIAGNOSIS — H2513 Age-related nuclear cataract, bilateral: Secondary | ICD-10-CM | POA: Diagnosis not present

## 2020-12-10 DIAGNOSIS — K219 Gastro-esophageal reflux disease without esophagitis: Secondary | ICD-10-CM | POA: Diagnosis not present

## 2020-12-10 DIAGNOSIS — E782 Mixed hyperlipidemia: Secondary | ICD-10-CM | POA: Diagnosis not present

## 2020-12-10 DIAGNOSIS — I10 Essential (primary) hypertension: Secondary | ICD-10-CM | POA: Diagnosis not present

## 2021-01-05 DIAGNOSIS — Z1211 Encounter for screening for malignant neoplasm of colon: Secondary | ICD-10-CM | POA: Diagnosis not present

## 2021-01-05 DIAGNOSIS — K219 Gastro-esophageal reflux disease without esophagitis: Secondary | ICD-10-CM | POA: Diagnosis not present

## 2021-02-09 DIAGNOSIS — E782 Mixed hyperlipidemia: Secondary | ICD-10-CM | POA: Diagnosis not present

## 2021-02-09 DIAGNOSIS — Z23 Encounter for immunization: Secondary | ICD-10-CM | POA: Diagnosis not present

## 2021-02-09 DIAGNOSIS — I10 Essential (primary) hypertension: Secondary | ICD-10-CM | POA: Diagnosis not present

## 2021-02-09 DIAGNOSIS — K219 Gastro-esophageal reflux disease without esophagitis: Secondary | ICD-10-CM | POA: Diagnosis not present

## 2021-02-25 ENCOUNTER — Encounter: Payer: Self-pay | Admitting: Gastroenterology

## 2021-02-26 ENCOUNTER — Encounter: Payer: Self-pay | Admitting: Gastroenterology

## 2021-02-26 ENCOUNTER — Ambulatory Visit
Admission: RE | Admit: 2021-02-26 | Discharge: 2021-02-26 | Disposition: A | Discharge status: Home / Self Care | Payer: Medicare Other | Attending: Gastroenterology | Admitting: Gastroenterology

## 2021-02-26 ENCOUNTER — Encounter
Admission: RE | Disposition: A | Discharge status: Home / Self Care | Payer: Self-pay | Source: Home / Self Care | Attending: Gastroenterology

## 2021-02-26 ENCOUNTER — Ambulatory Visit: Payer: Medicare Other | Admitting: Certified Registered Nurse Anesthetist

## 2021-02-26 ENCOUNTER — Other Ambulatory Visit: Payer: Self-pay

## 2021-02-26 DIAGNOSIS — K5730 Diverticulosis of large intestine without perforation or abscess without bleeding: Secondary | ICD-10-CM | POA: Insufficient documentation

## 2021-02-26 DIAGNOSIS — K56699 Other intestinal obstruction unspecified as to partial versus complete obstruction: Secondary | ICD-10-CM | POA: Insufficient documentation

## 2021-02-26 DIAGNOSIS — Z79899 Other long term (current) drug therapy: Secondary | ICD-10-CM | POA: Insufficient documentation

## 2021-02-26 DIAGNOSIS — Z9049 Acquired absence of other specified parts of digestive tract: Secondary | ICD-10-CM | POA: Insufficient documentation

## 2021-02-26 DIAGNOSIS — Z7982 Long term (current) use of aspirin: Secondary | ICD-10-CM | POA: Insufficient documentation

## 2021-02-26 DIAGNOSIS — Z1211 Encounter for screening for malignant neoplasm of colon: Secondary | ICD-10-CM | POA: Diagnosis not present

## 2021-02-26 DIAGNOSIS — K56609 Unspecified intestinal obstruction, unspecified as to partial versus complete obstruction: Secondary | ICD-10-CM | POA: Diagnosis not present

## 2021-02-26 DIAGNOSIS — K621 Rectal polyp: Secondary | ICD-10-CM | POA: Insufficient documentation

## 2021-02-26 DIAGNOSIS — Z86718 Personal history of other venous thrombosis and embolism: Secondary | ICD-10-CM | POA: Diagnosis not present

## 2021-02-26 DIAGNOSIS — K640 First degree hemorrhoids: Secondary | ICD-10-CM | POA: Insufficient documentation

## 2021-02-26 DIAGNOSIS — K635 Polyp of colon: Secondary | ICD-10-CM | POA: Diagnosis not present

## 2021-02-26 DIAGNOSIS — K649 Unspecified hemorrhoids: Secondary | ICD-10-CM | POA: Diagnosis not present

## 2021-02-26 HISTORY — DX: Hyperlipidemia, unspecified: E78.5

## 2021-02-26 HISTORY — PX: COLONOSCOPY: SHX5424

## 2021-02-26 SURGERY — COLONOSCOPY
Anesthesia: General

## 2021-02-26 MED ORDER — LIDOCAINE HCL (CARDIAC) PF 100 MG/5ML IV SOSY
PREFILLED_SYRINGE | INTRAVENOUS | Status: DC | PRN
  Administered 2021-02-26: 50 mg via INTRAVENOUS

## 2021-02-26 MED ORDER — EPHEDRINE SULFATE 50 MG/ML IJ SOLN
INTRAMUSCULAR | Status: DC | PRN
  Administered 2021-02-26: 5 mg via INTRAVENOUS

## 2021-02-26 MED ORDER — PROPOFOL 500 MG/50ML IV EMUL
INTRAVENOUS | Status: DC | PRN
  Administered 2021-02-26: 140 ug/kg/min via INTRAVENOUS

## 2021-02-26 MED ORDER — GLYCOPYRROLATE 0.2 MG/ML IJ SOLN
INTRAMUSCULAR | Status: DC | PRN
  Administered 2021-02-26: .2 mg via INTRAVENOUS

## 2021-02-26 MED ORDER — SODIUM CHLORIDE 0.9 % IV SOLN: INTRAVENOUS | Status: DC

## 2021-02-26 MED ORDER — PROPOFOL 10 MG/ML IV BOLUS
INTRAVENOUS | Status: DC | PRN
  Administered 2021-02-26: 50 mg via INTRAVENOUS

## 2021-02-26 NOTE — Op Note (Signed)
[facility] Gastroenterology Patient Name: Sherry Olson Procedure Date: 02/26/2021 10:15 AM MRN: [id number] Account #: [id number] Date of Birth: [birth year] Admit Type: Outpatient Age: 75 Room: [facility] ENDO ROOM 1 Gender: Female Note Status: Finalized Instrument Name: Colonscope [id number] Procedure:             Colonoscopy Indications:           Screening for colorectal malignant neoplasm Providers:             Annamaria Helling DO, DO Referring MD:          Perrin Maltese, MD (Referring MD) Medicines:             Monitored Anesthesia Care Complications:         No immediate complications. Estimated blood loss:                         Minimal. Procedure:             Pre-Anesthesia Assessment:                        - Prior to the procedure, a History and Physical was                         performed, and patient medications and allergies were                         reviewed. The patient is competent. The risks and                         benefits of the procedure and the sedation options and                         risks were discussed with the patient. All questions                         were answered and informed consent was obtained.                         Patient identification and proposed procedure were                         verified by the physician, the nurse, the anesthetist                         and the technician in the endoscopy suite. Mental                         Status Examination: alert and oriented. Airway                         Examination: normal oropharyngeal airway and neck                         mobility. Respiratory Examination: clear to                         auscultation. CV Examination: RRR, no murmurs, no S3  or S4. Prophylactic Antibiotics: The patient does not                         require prophylactic antibiotics. Prior                         Anticoagulants: The patient has taken no previous                          anticoagulant or antiplatelet agents. ASA Grade                         Assessment: II - A patient with mild systemic disease.                         After reviewing the risks and benefits, the patient                         was deemed in satisfactory condition to undergo the                         procedure. The anesthesia plan was to use monitored                         anesthesia care (MAC). Immediately prior to                         administration of medications, the patient was                         re-assessed for adequacy to receive sedatives. The                         heart rate, respiratory rate, oxygen saturations,                         blood pressure, adequacy of pulmonary ventilation, and                         response to care were monitored throughout the                         procedure. The physical status of the patient was                         re-assessed after the procedure.                        After obtaining informed consent, the colonoscope was                         passed under direct vision. A benign stricture was                         appreciated at 15cm. The adult colonoscope scope could                         not traverse so this was exchanged for a pediatric  colonsocope. Throughout the procedure, the patient's                         blood pressure, pulse, and oxygen saturations were                         monitored continuously. The Colonoscope was introduced                         through the anus and advanced to the the cecum,                         identified by appendiceal orifice and ileocecal valve.                         The colonoscopy was performed without difficulty. The                         patient tolerated the procedure well. The quality of                         the bowel preparation was evaluated using the BBPS                         [facility] Bowel Preparation Scale) with  scores of: Right                         Colon = 3, Transverse Colon = 3 and Left Colon = 3                         (entire mucosa seen well with no residual staining,                         small fragments of stool or opaque liquid). The total                         BBPS score equals 9. The ileocecal valve, appendiceal                         orifice, and rectum were photographed. Findings:      The perianal and digital rectal examinations were normal. Pertinent       negatives include normal sphincter tone.      Non-bleeding internal hemorrhoids were found during retroflexion. The       hemorrhoids were Grade I (internal hemorrhoids that do not prolapse).      Multiple small-mouthed diverticula were found in the left colon.       Estimated blood loss: none.      Two sessile polyps were found in the rectum. The polyps were 1 to 2 mm       in size. These polyps were removed with a cold biopsy forceps. Resection       and retrieval were complete. Estimated blood loss was minimal.      A benign-appearing, intrinsic mild stenosis measuring less than one cm       (in length) x 1 cm (inner diameter) was found in the recto-sigmoid colon       (15 cm from the anus) and the  area was traversed after exchange from       adult to pediatric colonoscope with ease. Estimated blood loss: none. Impression:            - Non-bleeding internal hemorrhoids.                        - Diverticulosis in the left colon.                        - Two 1 to 2 mm polyps in the rectum, removed with a                         cold biopsy forceps. Resected and retrieved.                        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 Recommendation:        - Discharge patient to home.                        - Resume previous diet.                        - Continue present medications.                        - Await pathology results.                        - Repeat colonoscopy for surveillance based on                          pathology results.                        - Return to referring physician as previously                         scheduled. Procedure Code(s):     --- Professional ---                        [phone], Colonoscopy, flexible; with biopsy, single or                         multiple Diagnosis Code(s):     --- Professional ---                        Z12.11, Encounter for screening for malignant neoplasm                         of colon                        K64.0, First degree hemorrhoids                        K62.1, Rectal polyp                        K56.699, Other intestinal obstruction unspecified as  to partial versus complete obstruction                        K57.30, Diverticulosis of large intestine without                         perforation or abscess without bleeding CPT copyright 2019 American Medical Association. All rights reserved. The codes documented in this report are preliminary and upon coder review may  be revised to meet current compliance requirements. Attending Participation:      I personally performed the entire procedure. Volney American, DO Annamaria Helling DO, DO 02/26/2021 11:02:54 AM This report has been signed electronically. Number of Addenda: 0 Note Initiated On: 02/26/2021 10:15 AM Scope Withdrawal Time: 0 hours 9 minutes 20 seconds  Total Procedure Duration: 0 hours 19 minutes 8 seconds  Estimated Blood Loss:  Estimated blood loss was minimal.      [facility]

## 2021-02-26 NOTE — H&P (Signed)
[facility] Gastroenterology Pre-Procedure H&P   Patient ID: Sherry Olson is a 75 y.o. female.  Gastroenterology Provider: [name], DO  Referring Provider: Tawni Pummel, PA PCP: Margaretann Loveless, MD  Date: 02/26/2021  HPI [name] is a 75 y.o. female who presents today for Colonoscopy for initial screening colonoscopy.  2012 colonoscopy normal  H/o colon resection in 2012 for diverticulitis  H/o alternating diarrhea, constipation- trialing fiber. Not using bentyl.  S/p cholecystectomy.  No other acute gi complaints.   Past Medical History:  Diagnosis Date   Coronary artery disease    DVT (deep venous thrombosis) ([facility])    HLD (hyperlipidemia)    Hypertension     Past Surgical History:  Procedure Laterality Date   BREAST BIOPSY Right 1996   negative   CHOLECYSTECTOMY     COLON SURGERY  2012   colon resection    Family History No h/o GI disease or malignancy  Review of Systems  Constitutional:  Negative for activity change, appetite change, fatigue, fever and unexpected weight change.  HENT:  Negative for trouble swallowing and voice change.   Respiratory:  Negative for shortness of breath and wheezing.   Cardiovascular:  Negative for chest pain and palpitations.  Gastrointestinal:  Positive for constipation and diarrhea. Negative for abdominal distention, abdominal pain, anal bleeding, blood in stool, nausea, rectal pain and vomiting.  Musculoskeletal:  Negative for arthralgias and myalgias.  Skin:  Negative for color change and pallor.  Neurological:  Negative for dizziness, syncope and weakness.  Psychiatric/Behavioral:  Negative for confusion.   All other systems reviewed and are negative.   Medications No current facility-administered medications on file prior to encounter.   Current Outpatient Medications on File Prior to Encounter  Medication Sig Dispense Refill   aspirin 81 MG chewable tablet Chew by mouth daily.      benazepril (LOTENSIN) 20 MG tablet Take 20 mg by mouth daily.     CITALOPRAM HYDROBROMIDE PO Take by mouth.     LORazepam (ATIVAN) 0.5 MG tablet Take 0.5 mg by mouth as needed for anxiety.     METOPROLOL SUCCINATE PO Take by mouth.     amLODipine (NORVASC) 5 MG tablet Take 5 mg by mouth daily. (Patient not taking: No sig reported)     atorvastatin (LIPITOR) 10 MG tablet Take 10 mg by mouth daily.     Calcium Carbonate (CALCIUM 600 PO) Take by mouth daily.     cholecalciferol (VITAMIN D) 1000 units tablet Take 1,000 Units by mouth daily.     dicyclomine (BENTYL) 10 MG capsule Take 10 mg by mouth 4 (four) times daily -  before meals and at bedtime. (Patient not taking: Reported on 02/26/2021)     FLUTICASONE PROPIONATE EX Apply topically. (Patient not taking: Reported on 02/26/2021)     furosemide (LASIX) 20 MG tablet Take 20 mg by mouth daily. (Patient not taking: Reported on 02/26/2021)     methylPREDNISolone (MEDROL DOSEPAK) 4 MG TBPK tablet Take 4 mg by mouth daily. (Patient not taking: Reported on 02/26/2021)     omeprazole (PRILOSEC) 40 MG capsule Take 40 mg by mouth daily.     warfarin (COUMADIN) 1 MG tablet Take 1 mg by mouth daily. (Patient not taking: Reported on 02/26/2021)     warfarin (COUMADIN) 4 MG tablet Take 4 mg by mouth daily. (Patient not taking: Reported on 02/26/2021)      Pertinent medications related to GI and procedure were reviewed by me with  the patient prior to the procedure   Current Facility-Administered Medications:    0.9 %  sodium chloride infusion, , Intravenous, Continuous, [name], DO, Last Rate: 20 mL/hr at 02/26/21 1025, Continued from Pre-op at 02/26/21 8527  sodium chloride 20 mL/hr at 02/26/21 7824       Allergies  Allergen Reactions   Amiodarone    Welchol [Colesevelam Hcl] Diarrhea and Nausea And Vomiting   Allergies were reviewed by me prior to the procedure  Objective    Vitals:   02/26/21 0909  BP: (!) 124/54  Pulse: 69   Resp: 14  Temp: (!) 97.3 F (36.3 C)  TempSrc: Temporal  SpO2: 100%  Weight: 55.8 kg  Height: 5\' 3"  (1.6 m)     Physical Exam Vitals and nursing note reviewed.  Constitutional:      General: She is not in acute distress.    Appearance: Normal appearance. She is normal weight. She is not ill-appearing, toxic-appearing or diaphoretic.  HENT:     Head: Normocephalic and atraumatic.     Nose: Nose normal.     Mouth/Throat:     Mouth: Mucous membranes are moist.     Pharynx: Oropharynx is clear.  Eyes:     General: No scleral icterus.    Extraocular Movements: Extraocular movements intact.  Cardiovascular:     Rate and Rhythm: Normal rate and regular rhythm.     Heart sounds: Normal heart sounds. No murmur heard.   No friction rub. No gallop.  Pulmonary:     Effort: Pulmonary effort is normal. No respiratory distress.     Breath sounds: Normal breath sounds. No wheezing, rhonchi or rales.  Abdominal:     General: Abdomen is flat. Bowel sounds are normal. There is no distension.     Palpations: Abdomen is soft.     Tenderness: There is no abdominal tenderness. There is no guarding or rebound.  Musculoskeletal:     Cervical back: Neck supple.     Right lower leg: No edema.     Left lower leg: No edema.  Skin:    General: Skin is warm and dry.     Coloration: Skin is not jaundiced or pale.  Neurological:     General: No focal deficit present.     Mental Status: She is alert and oriented to person, place, and time. Mental status is at baseline.  Psychiatric:        Mood and Affect: Mood normal.        Behavior: Behavior normal.        Thought Content: Thought content normal.        Judgment: Judgment normal.     Assessment:  [name] is a 75 y.o. female  who presents today for Colonoscopy for 10 year screening.  Plan:  Colonoscopy with possible intervention today  Colonoscopy with possible biopsy, control of bleeding, polypectomy, and interventions as  necessary has been discussed with the patient/patient representative. Informed consent was obtained from the patient/patient representative after explaining the indication, nature, and risks of the procedure including but not limited to death, bleeding, perforation, missed neoplasm/lesions, cardiorespiratory compromise, and reaction to medications. Opportunity for questions was given and appropriate answers were provided. Patient/patient representative has verbalized understanding is amenable to undergoing the procedure.   72, DO  [facility] Gastroenterology  Portions of the record may have been created with voice recognition software. Occasional wrong-word or 'sound-a-like' substitutions may have occurred due to the inherent  limitations of voice recognition software.  Read the chart carefully and recognize, using context, where substitutions may have occurred.

## 2021-02-26 NOTE — Anesthesia Preprocedure Evaluation (Signed)
Anesthesia Evaluation  Patient identified by MRN, date of birth, ID band Patient awake    Reviewed: Allergy & Precautions, H&P , NPO status , Patient's Chart, lab work & pertinent test results  History of Anesthesia Complications Negative for: history of anesthetic complications  Airway Mallampati: II  TM Distance: >3 FB Neck ROM: Full    Dental  (+)    Pulmonary neg pulmonary ROS, neg sleep apnea, neg COPD,    Pulmonary exam normal        Cardiovascular hypertension, (-) angina+ CAD  (-) Past MI and (-) Cardiac Stents Normal cardiovascular exam(-) dysrhythmias      Neuro/Psych negative neurological ROS  negative psych ROS   GI/Hepatic negative GI ROS, Neg liver ROS,   Endo/Other  negative endocrine ROS  Renal/GU negative Renal ROS  negative genitourinary   Musculoskeletal   Abdominal   Peds  Hematology negative hematology ROS (+)   Anesthesia Other Findings Past Medical History: No date: Coronary artery disease No date: DVT (deep venous thrombosis) (HCC) No date: HLD (hyperlipidemia) No date: Hypertension  Past Surgical History: 1996: BREAST BIOPSY; Right     Comment:  negative No date: CHOLECYSTECTOMY 2012: COLON SURGERY     Comment:  colon resection  BMI    Body Mass Index: 21.79 kg/m      Reproductive/Obstetrics negative OB ROS                             Anesthesia Physical Anesthesia Plan  ASA: 2  Anesthesia Plan: General   Post-op Pain Management:    Induction:   PONV Risk Score and Plan: Propofol infusion and TIVA  Airway Management Planned: Nasal Cannula  Additional Equipment:   Intra-op Plan:   Post-operative Plan:   Informed Consent: I have reviewed the patients History and Physical, chart, labs and discussed the procedure including the risks, benefits and alternatives for the proposed anesthesia with the patient or authorized representative who has  indicated his/her understanding and acceptance.     Dental Advisory Given  Plan Discussed with: Anesthesiologist, CRNA and Surgeon  Anesthesia Plan Comments:         Anesthesia Quick Evaluation

## 2021-02-26 NOTE — Interval H&P Note (Signed)
History and Physical Interval Note: Preprocedure H&P from 02/26/21  was reviewed and there was no interval change after seeing and examining the patient.  Written consent was obtained from the patient after discussion of risks, benefits, and alternatives. Patient has consented to proceed with Colonoscopy with possible intervention   02/26/2021 10:28 AM  Sherry Olson  has presented today for surgery, with the diagnosis of (Z12.11) COLON CANCER SCREENING.  The various methods of treatment have been discussed with the patient and family. After consideration of risks, benefits and other options for treatment, the patient has consented to  Procedure(s): COLONOSCOPY (N/A) as a surgical intervention.  The patient's history has been reviewed, patient examined, no change in status, stable for surgery.  I have reviewed the patient's chart and labs.  Questions were answered to the patient's satisfaction.     [name]

## 2021-02-26 NOTE — Anesthesia Postprocedure Evaluation (Signed)
Anesthesia Post Note  Patient: Sherry Olson  Procedure(s) Performed: COLONOSCOPY  Patient location during evaluation: PACU Anesthesia Type: General Level of consciousness: awake and alert Pain management: pain level controlled Vital Signs Assessment: post-procedure vital signs reviewed and stable Respiratory status: spontaneous breathing, nonlabored ventilation, respiratory function stable and patient connected to nasal cannula oxygen Cardiovascular status: blood pressure returned to baseline and stable Postop Assessment: no apparent nausea or vomiting Anesthetic complications: no   No notable events documented.   Last Vitals:  Vitals:   02/26/21 1107 02/26/21 1117  BP: 113/63 (!) 114/56  Pulse: 77 73  Resp:    Temp:    SpO2: 100% 100%    Last Pain:  Vitals:   02/26/21 1117  TempSrc:   PainSc: 0-No pain                 Aurelio Brash Mckynzi Cammon

## 2021-02-26 NOTE — Transfer of Care (Signed)
Immediate Anesthesia Transfer of Care Note  Patient: Sherry Olson  Procedure(s) Performed: COLONOSCOPY  Patient Location: Endoscopy Unit  Anesthesia Type:General  Level of Consciousness: awake, alert  and oriented  Airway & Oxygen Therapy: Patient Spontanous Breathing  Post-op Assessment: Report given to RN and Post -op Vital signs reviewed and stable  Post vital signs: Reviewed and stable  Last Vitals:  Vitals Value Taken Time  BP 98   Temp    Pulse 76   Resp 15   SpO2 100     Last Pain:  Vitals:   02/26/21 0909  TempSrc: Temporal  PainSc: 0-No pain         Complications: No notable events documented.

## 2021-02-27 NOTE — Progress Notes (Signed)
Non-identifying Voicemail.  No Message Left. 

## 2021-03-01 ENCOUNTER — Encounter: Payer: Self-pay | Admitting: Gastroenterology

## 2021-03-01 LAB — SURGICAL PATHOLOGY

## 2021-03-26 ENCOUNTER — Other Ambulatory Visit: Payer: Self-pay | Admitting: Internal Medicine

## 2021-03-26 DIAGNOSIS — Z1231 Encounter for screening mammogram for malignant neoplasm of breast: Secondary | ICD-10-CM

## 2021-04-27 DIAGNOSIS — R197 Diarrhea, unspecified: Secondary | ICD-10-CM | POA: Diagnosis not present

## 2021-05-05 ENCOUNTER — Ambulatory Visit
Admission: RE | Admit: 2021-05-05 | Discharge: 2021-05-05 | Disposition: A | Discharge status: Home / Self Care | Payer: Medicare Other | Source: Ambulatory Visit | Attending: Internal Medicine | Admitting: Internal Medicine

## 2021-05-05 ENCOUNTER — Other Ambulatory Visit: Payer: Self-pay

## 2021-05-05 DIAGNOSIS — Z1231 Encounter for screening mammogram for malignant neoplasm of breast: Secondary | ICD-10-CM | POA: Insufficient documentation

## 2021-05-13 DIAGNOSIS — I10 Essential (primary) hypertension: Secondary | ICD-10-CM | POA: Diagnosis not present

## 2021-05-13 DIAGNOSIS — E782 Mixed hyperlipidemia: Secondary | ICD-10-CM | POA: Diagnosis not present

## 2021-05-13 DIAGNOSIS — K219 Gastro-esophageal reflux disease without esophagitis: Secondary | ICD-10-CM | POA: Diagnosis not present

## 2021-05-24 DIAGNOSIS — H40013 Open angle with borderline findings, low risk, bilateral: Secondary | ICD-10-CM | POA: Diagnosis not present

## 2021-05-24 DIAGNOSIS — H35372 Puckering of macula, left eye: Secondary | ICD-10-CM | POA: Diagnosis not present

## 2021-05-24 DIAGNOSIS — H2513 Age-related nuclear cataract, bilateral: Secondary | ICD-10-CM | POA: Diagnosis not present

## 2021-07-22 DIAGNOSIS — E782 Mixed hyperlipidemia: Secondary | ICD-10-CM | POA: Diagnosis not present

## 2021-07-22 DIAGNOSIS — M50321 Other cervical disc degeneration at C4-C5 level: Secondary | ICD-10-CM | POA: Diagnosis not present

## 2021-07-22 DIAGNOSIS — I10 Essential (primary) hypertension: Secondary | ICD-10-CM | POA: Diagnosis not present

## 2021-07-22 DIAGNOSIS — J3089 Other allergic rhinitis: Secondary | ICD-10-CM | POA: Diagnosis not present

## 2021-07-22 DIAGNOSIS — M542 Cervicalgia: Secondary | ICD-10-CM | POA: Diagnosis not present

## 2021-07-22 DIAGNOSIS — M50323 Other cervical disc degeneration at C6-C7 level: Secondary | ICD-10-CM | POA: Diagnosis not present

## 2021-07-29 DIAGNOSIS — M542 Cervicalgia: Secondary | ICD-10-CM | POA: Diagnosis not present

## 2021-07-29 DIAGNOSIS — E782 Mixed hyperlipidemia: Secondary | ICD-10-CM | POA: Diagnosis not present

## 2021-07-29 DIAGNOSIS — M50321 Other cervical disc degeneration at C4-C5 level: Secondary | ICD-10-CM | POA: Diagnosis not present

## 2021-07-29 DIAGNOSIS — I10 Essential (primary) hypertension: Secondary | ICD-10-CM | POA: Diagnosis not present

## 2021-08-12 DIAGNOSIS — K219 Gastro-esophageal reflux disease without esophagitis: Secondary | ICD-10-CM | POA: Diagnosis not present

## 2021-08-12 DIAGNOSIS — E782 Mixed hyperlipidemia: Secondary | ICD-10-CM | POA: Diagnosis not present

## 2021-08-12 DIAGNOSIS — I10 Essential (primary) hypertension: Secondary | ICD-10-CM | POA: Diagnosis not present

## 2021-09-02 ENCOUNTER — Other Ambulatory Visit: Payer: Self-pay | Admitting: Internal Medicine

## 2021-09-02 DIAGNOSIS — M542 Cervicalgia: Secondary | ICD-10-CM

## 2021-09-09 DIAGNOSIS — M542 Cervicalgia: Secondary | ICD-10-CM | POA: Diagnosis not present

## 2021-09-09 DIAGNOSIS — I10 Essential (primary) hypertension: Secondary | ICD-10-CM | POA: Diagnosis not present

## 2021-09-09 DIAGNOSIS — E782 Mixed hyperlipidemia: Secondary | ICD-10-CM | POA: Diagnosis not present

## 2021-09-09 DIAGNOSIS — S20461A Insect bite (nonvenomous) of right back wall of thorax, initial encounter: Secondary | ICD-10-CM | POA: Diagnosis not present

## 2021-09-15 ENCOUNTER — Ambulatory Visit: Payer: Medicare Other

## 2021-09-27 DIAGNOSIS — I340 Nonrheumatic mitral (valve) insufficiency: Secondary | ICD-10-CM | POA: Diagnosis not present

## 2021-09-27 DIAGNOSIS — R5383 Other fatigue: Secondary | ICD-10-CM | POA: Diagnosis not present

## 2021-09-27 DIAGNOSIS — E782 Mixed hyperlipidemia: Secondary | ICD-10-CM | POA: Diagnosis not present

## 2021-09-27 DIAGNOSIS — I10 Essential (primary) hypertension: Secondary | ICD-10-CM | POA: Diagnosis not present

## 2021-09-27 DIAGNOSIS — Z112 Encounter for screening for other bacterial diseases: Secondary | ICD-10-CM | POA: Diagnosis not present

## 2021-10-12 DIAGNOSIS — E782 Mixed hyperlipidemia: Secondary | ICD-10-CM | POA: Diagnosis not present

## 2021-10-14 DIAGNOSIS — I340 Nonrheumatic mitral (valve) insufficiency: Secondary | ICD-10-CM | POA: Diagnosis not present

## 2021-10-14 DIAGNOSIS — I5030 Unspecified diastolic (congestive) heart failure: Secondary | ICD-10-CM | POA: Diagnosis not present

## 2021-10-14 DIAGNOSIS — I351 Nonrheumatic aortic (valve) insufficiency: Secondary | ICD-10-CM | POA: Diagnosis not present

## 2021-10-14 DIAGNOSIS — E782 Mixed hyperlipidemia: Secondary | ICD-10-CM | POA: Diagnosis not present

## 2021-10-14 DIAGNOSIS — I10 Essential (primary) hypertension: Secondary | ICD-10-CM | POA: Diagnosis not present

## 2021-10-15 DIAGNOSIS — I351 Nonrheumatic aortic (valve) insufficiency: Secondary | ICD-10-CM | POA: Diagnosis not present

## 2021-10-15 DIAGNOSIS — I5030 Unspecified diastolic (congestive) heart failure: Secondary | ICD-10-CM | POA: Diagnosis not present

## 2021-10-15 DIAGNOSIS — I340 Nonrheumatic mitral (valve) insufficiency: Secondary | ICD-10-CM | POA: Diagnosis not present

## 2021-10-15 DIAGNOSIS — E782 Mixed hyperlipidemia: Secondary | ICD-10-CM | POA: Diagnosis not present

## 2021-10-15 DIAGNOSIS — I10 Essential (primary) hypertension: Secondary | ICD-10-CM | POA: Diagnosis not present

## 2021-11-09 DIAGNOSIS — R29890 Loss of height: Secondary | ICD-10-CM | POA: Diagnosis not present

## 2021-11-09 DIAGNOSIS — J3089 Other allergic rhinitis: Secondary | ICD-10-CM | POA: Diagnosis not present

## 2021-11-09 DIAGNOSIS — E782 Mixed hyperlipidemia: Secondary | ICD-10-CM | POA: Diagnosis not present

## 2021-11-09 DIAGNOSIS — M8589 Other specified disorders of bone density and structure, multiple sites: Secondary | ICD-10-CM | POA: Diagnosis not present

## 2021-11-09 DIAGNOSIS — K219 Gastro-esophageal reflux disease without esophagitis: Secondary | ICD-10-CM | POA: Diagnosis not present

## 2021-11-09 DIAGNOSIS — I10 Essential (primary) hypertension: Secondary | ICD-10-CM | POA: Diagnosis not present

## 2021-11-10 DIAGNOSIS — H2513 Age-related nuclear cataract, bilateral: Secondary | ICD-10-CM | POA: Diagnosis not present

## 2021-11-10 DIAGNOSIS — H35372 Puckering of macula, left eye: Secondary | ICD-10-CM | POA: Diagnosis not present

## 2021-11-10 DIAGNOSIS — H40013 Open angle with borderline findings, low risk, bilateral: Secondary | ICD-10-CM | POA: Diagnosis not present

## 2021-12-01 DIAGNOSIS — R9389 Abnormal findings on diagnostic imaging of other specified body structures: Secondary | ICD-10-CM | POA: Diagnosis not present

## 2021-12-20 ENCOUNTER — Encounter: Payer: Self-pay | Admitting: Obstetrics & Gynecology

## 2021-12-20 ENCOUNTER — Ambulatory Visit: Payer: Medicare Other | Admitting: Obstetrics & Gynecology

## 2021-12-20 ENCOUNTER — Other Ambulatory Visit (HOSPITAL_COMMUNITY)
Admission: RE | Admit: 2021-12-20 | Discharge: 2021-12-20 | Disposition: A | Discharge status: Home / Self Care | Payer: Medicare Other | Source: Ambulatory Visit | Attending: Obstetrics & Gynecology | Admitting: Obstetrics & Gynecology

## 2021-12-20 VITALS — BP 140/80 | Ht 63.0 in | Wt 121.8 lb

## 2021-12-20 DIAGNOSIS — N950 Postmenopausal bleeding: Secondary | ICD-10-CM | POA: Insufficient documentation

## 2021-12-20 DIAGNOSIS — R9389 Abnormal findings on diagnostic imaging of other specified body structures: Secondary | ICD-10-CM

## 2021-12-20 NOTE — Progress Notes (Signed)
   Subjective:    Patient ID: Sherry Olson, female    DOB: [birth year], 76 y.o.   MRN: [id number]  HPI  76 yo married P1  here with 2 occasions of PMB in August. She had a [facility] ultrasound that showed the following: IMPRESSION:   Heterogeneous cystic and mildly thickened endometrium indeterminate but may be seen the setting of endometrial malignancy. Given history of postmenopausal bleeding, recommend tissue sampling. Exam End: 12/01/21 13:24     Review of Systems She has been married for 54 years. She is abstinent.     Objective:   Physical Exam Well nourished, well hydrated White female, no apparent distress She is ambulating and conversing normally.   UPT negative, consent signed, time out done Cervix prepped with betadine and sprayed with Hurricaine spray. I then grasped with a single tooth tenaculum. Uterus sounded to 8 cm Pipelle used for 2 passes with a small amount of tissue obtained. She tolerated the procedure well.      Assessment & Plan:   PMB with 5.1 mm endometrium- await pathology report. She told me that I can leave a message on her answering machine if she doesn't answer.

## 2021-12-22 LAB — SURGICAL PATHOLOGY

## 2022-01-07 DIAGNOSIS — I10 Essential (primary) hypertension: Secondary | ICD-10-CM | POA: Diagnosis not present

## 2022-01-07 DIAGNOSIS — M50321 Other cervical disc degeneration at C4-C5 level: Secondary | ICD-10-CM | POA: Diagnosis not present

## 2022-01-07 DIAGNOSIS — M542 Cervicalgia: Secondary | ICD-10-CM | POA: Diagnosis not present

## 2022-01-07 DIAGNOSIS — E782 Mixed hyperlipidemia: Secondary | ICD-10-CM | POA: Diagnosis not present

## 2022-01-19 ENCOUNTER — Ambulatory Visit
Admission: RE | Admit: 2022-01-19 | Discharge: 2022-01-19 | Disposition: A | Discharge status: Home / Self Care | Payer: Medicare Other | Source: Ambulatory Visit | Attending: Internal Medicine | Admitting: Internal Medicine

## 2022-01-19 DIAGNOSIS — M542 Cervicalgia: Secondary | ICD-10-CM | POA: Insufficient documentation

## 2022-01-21 DIAGNOSIS — M4800 Spinal stenosis, site unspecified: Secondary | ICD-10-CM | POA: Diagnosis not present

## 2022-01-21 DIAGNOSIS — R052 Subacute cough: Secondary | ICD-10-CM | POA: Diagnosis not present

## 2022-01-21 DIAGNOSIS — I10 Essential (primary) hypertension: Secondary | ICD-10-CM | POA: Diagnosis not present

## 2022-01-21 DIAGNOSIS — M5021 Other cervical disc displacement,  high cervical region: Secondary | ICD-10-CM | POA: Diagnosis not present

## 2022-01-21 DIAGNOSIS — E782 Mixed hyperlipidemia: Secondary | ICD-10-CM | POA: Diagnosis not present

## 2022-01-21 DIAGNOSIS — J3089 Other allergic rhinitis: Secondary | ICD-10-CM | POA: Diagnosis not present

## 2022-01-24 DIAGNOSIS — R052 Subacute cough: Secondary | ICD-10-CM | POA: Diagnosis not present

## 2022-01-24 DIAGNOSIS — M5021 Other cervical disc displacement,  high cervical region: Secondary | ICD-10-CM | POA: Diagnosis not present

## 2022-01-28 DIAGNOSIS — M5412 Radiculopathy, cervical region: Secondary | ICD-10-CM | POA: Diagnosis not present

## 2022-02-03 ENCOUNTER — Other Ambulatory Visit: Payer: Medicare Other

## 2022-02-04 DIAGNOSIS — E782 Mixed hyperlipidemia: Secondary | ICD-10-CM | POA: Diagnosis not present

## 2022-02-04 DIAGNOSIS — I351 Nonrheumatic aortic (valve) insufficiency: Secondary | ICD-10-CM | POA: Diagnosis not present

## 2022-02-04 DIAGNOSIS — I340 Nonrheumatic mitral (valve) insufficiency: Secondary | ICD-10-CM | POA: Diagnosis not present

## 2022-02-04 DIAGNOSIS — I10 Essential (primary) hypertension: Secondary | ICD-10-CM | POA: Diagnosis not present

## 2022-02-04 DIAGNOSIS — I4891 Unspecified atrial fibrillation: Secondary | ICD-10-CM | POA: Diagnosis not present

## 2022-02-06 ENCOUNTER — Other Ambulatory Visit: Payer: Self-pay

## 2022-02-10 DIAGNOSIS — E782 Mixed hyperlipidemia: Secondary | ICD-10-CM | POA: Diagnosis not present

## 2022-02-10 DIAGNOSIS — I340 Nonrheumatic mitral (valve) insufficiency: Secondary | ICD-10-CM | POA: Diagnosis not present

## 2022-02-10 DIAGNOSIS — I351 Nonrheumatic aortic (valve) insufficiency: Secondary | ICD-10-CM | POA: Diagnosis not present

## 2022-02-10 DIAGNOSIS — I10 Essential (primary) hypertension: Secondary | ICD-10-CM | POA: Diagnosis not present

## 2022-02-10 DIAGNOSIS — I4891 Unspecified atrial fibrillation: Secondary | ICD-10-CM | POA: Diagnosis not present

## 2022-02-14 DIAGNOSIS — I4891 Unspecified atrial fibrillation: Secondary | ICD-10-CM | POA: Diagnosis not present

## 2022-02-17 DIAGNOSIS — I10 Essential (primary) hypertension: Secondary | ICD-10-CM | POA: Diagnosis not present

## 2022-02-17 DIAGNOSIS — E782 Mixed hyperlipidemia: Secondary | ICD-10-CM | POA: Diagnosis not present

## 2022-02-17 DIAGNOSIS — I351 Nonrheumatic aortic (valve) insufficiency: Secondary | ICD-10-CM | POA: Diagnosis not present

## 2022-02-17 DIAGNOSIS — I4891 Unspecified atrial fibrillation: Secondary | ICD-10-CM | POA: Diagnosis not present

## 2022-02-17 DIAGNOSIS — I340 Nonrheumatic mitral (valve) insufficiency: Secondary | ICD-10-CM | POA: Diagnosis not present

## 2022-02-21 DIAGNOSIS — I10 Essential (primary) hypertension: Secondary | ICD-10-CM | POA: Diagnosis not present

## 2022-02-21 DIAGNOSIS — E782 Mixed hyperlipidemia: Secondary | ICD-10-CM | POA: Diagnosis not present

## 2022-02-21 DIAGNOSIS — I351 Nonrheumatic aortic (valve) insufficiency: Secondary | ICD-10-CM | POA: Diagnosis not present

## 2022-02-21 DIAGNOSIS — I340 Nonrheumatic mitral (valve) insufficiency: Secondary | ICD-10-CM | POA: Diagnosis not present

## 2022-02-21 DIAGNOSIS — I4891 Unspecified atrial fibrillation: Secondary | ICD-10-CM | POA: Diagnosis not present

## 2022-02-22 ENCOUNTER — Other Ambulatory Visit: Payer: Medicare Other

## 2022-02-28 ENCOUNTER — Other Ambulatory Visit: Payer: Self-pay

## 2022-02-28 ENCOUNTER — Emergency Department
Admission: EM | Admit: 2022-02-28 | Discharge: 2022-02-28 | Disposition: A | Discharge status: Home / Self Care | Payer: Medicare Other | Attending: Emergency Medicine | Admitting: Emergency Medicine

## 2022-02-28 ENCOUNTER — Emergency Department: Payer: Medicare Other

## 2022-02-28 DIAGNOSIS — J0100 Acute maxillary sinusitis, unspecified: Secondary | ICD-10-CM | POA: Diagnosis not present

## 2022-02-28 DIAGNOSIS — J189 Pneumonia, unspecified organism: Secondary | ICD-10-CM | POA: Diagnosis not present

## 2022-02-28 DIAGNOSIS — Z20822 Contact with and (suspected) exposure to covid-19: Secondary | ICD-10-CM | POA: Insufficient documentation

## 2022-02-28 DIAGNOSIS — J069 Acute upper respiratory infection, unspecified: Secondary | ICD-10-CM | POA: Insufficient documentation

## 2022-02-28 DIAGNOSIS — R911 Solitary pulmonary nodule: Secondary | ICD-10-CM | POA: Diagnosis not present

## 2022-02-28 DIAGNOSIS — R059 Cough, unspecified: Secondary | ICD-10-CM | POA: Diagnosis not present

## 2022-02-28 DIAGNOSIS — R051 Acute cough: Secondary | ICD-10-CM

## 2022-02-28 DIAGNOSIS — R519 Headache, unspecified: Secondary | ICD-10-CM | POA: Diagnosis not present

## 2022-02-28 DIAGNOSIS — R1111 Vomiting without nausea: Secondary | ICD-10-CM

## 2022-02-28 DIAGNOSIS — R112 Nausea with vomiting, unspecified: Secondary | ICD-10-CM | POA: Diagnosis not present

## 2022-02-28 LAB — URINALYSIS, ROUTINE W REFLEX MICROSCOPIC
Bilirubin Urine: NEGATIVE
Glucose, UA: NEGATIVE mg/dL
Hgb urine dipstick: NEGATIVE
Ketones, ur: NEGATIVE mg/dL
Leukocytes,Ua: NEGATIVE
Nitrite: NEGATIVE
Protein, ur: NEGATIVE mg/dL
Specific Gravity, Urine: 1.01 (ref 1.005–1.030)
pH: 7 (ref 5.0–8.0)

## 2022-02-28 LAB — CBC
HCT: 40.9 % (ref 36.0–46.0)
Hemoglobin: 13.8 g/dL (ref 12.0–15.0)
MCH: 28.3 pg (ref 26.0–34.0)
MCHC: 33.7 g/dL (ref 30.0–36.0)
MCV: 83.8 fL (ref 80.0–100.0)
Platelets: 194 10*3/uL (ref 150–400)
RBC: 4.88 MIL/uL (ref 3.87–5.11)
RDW: 12.8 % (ref 11.5–15.5)
WBC: 5.7 10*3/uL (ref 4.0–10.5)
nRBC: 0 % (ref 0.0–0.2)

## 2022-02-28 LAB — COMPREHENSIVE METABOLIC PANEL
ALT: 17 U/L (ref 0–44)
AST: 27 U/L (ref 15–41)
Albumin: 4.1 g/dL (ref 3.5–5.0)
Alkaline Phosphatase: 68 U/L (ref 38–126)
Anion gap: 8 (ref 5–15)
BUN: 11 mg/dL (ref 8–23)
CO2: 30 mmol/L (ref 22–32)
Calcium: 9.8 mg/dL (ref 8.9–10.3)
Chloride: 102 mmol/L (ref 98–111)
Creatinine, Ser: 0.75 mg/dL (ref 0.44–1.00)
GFR, Estimated: >60 mL/min (ref >60)
Glucose, Bld: 101 mg/dL — ABNORMAL HIGH (ref 70–99)
Potassium: 4.2 mmol/L (ref 3.5–5.1)
Sodium: 140 mmol/L (ref 135–145)
Total Bilirubin: 1 mg/dL (ref 0.3–1.2)
Total Protein: 7.1 g/dL (ref 6.5–8.1)

## 2022-02-28 LAB — RESP PANEL BY RT-PCR (FLU A&B, COVID) ARPGX2
Influenza A by PCR: NEGATIVE
Influenza B by PCR: NEGATIVE
SARS Coronavirus 2 by RT PCR: NEGATIVE

## 2022-02-28 LAB — LIPASE, BLOOD: Lipase: 37 U/L (ref 11–51)

## 2022-02-28 MED ORDER — AMOXICILLIN-POT CLAVULANATE 875-125 MG PO TABS: 1.0000 | ORAL_TABLET | Freq: Two times a day (BID) | ORAL | 0 refills | Status: AC

## 2022-02-28 MED ORDER — ONDANSETRON 4 MG PO TBDP: 4.0000 mg | ORAL_TABLET | Freq: Three times a day (TID) | ORAL | 0 refills | Status: AC | PRN

## 2022-02-28 MED ORDER — ONDANSETRON 4 MG PO TBDP
4.0000 mg | ORAL_TABLET | Freq: Once | ORAL | Status: AC
  Administered 2022-02-28: 4 mg via ORAL
  Filled 2022-02-28: qty 1

## 2022-02-28 MED ORDER — ACETAMINOPHEN 500 MG PO TABS: 1000.0000 mg | ORAL_TABLET | Freq: Once | ORAL | Status: DC

## 2022-02-28 MED ORDER — DOXYCYCLINE MONOHYDRATE 100 MG PO TABS: 100.0000 mg | ORAL_TABLET | Freq: Two times a day (BID) | ORAL | 0 refills | Status: AC

## 2022-02-28 NOTE — ED Provider Notes (Signed)
[facility] Provider Note    Event Date/Time   First MD Initiated Contact with Patient 02/28/22 1121     (approximate)   History   Nausea, Cough, and Blurred Vision   HPI  Sherry Olson is a 76 y.o. female presents to the emergency department for not feeling well.  Patient states that she has been having a cough and congestion and has been coughing constantly.  Complaining of headache and sinus pressure.  Also complaining minimal systemic and that she has been vomiting with eating.  Was recently started on multiple NSAIDs.  Also states that she has been on antibiotic recently for her cough.  Denies any chest pain or fever.  Denies any shortness of breath at this time.  No falls or trauma.  Denies any change in vision, felt like her eyes were blurry from being watery.  Denies any extremity numbness or weakness.  No dizziness.     Physical Exam   Triage Vital Signs: ED Triage Vitals  Enc Vitals Group     BP 02/28/22 0959 (!) 136/90     Pulse Rate 02/28/22 0959 85     Resp 02/28/22 0959 18     Temp 02/28/22 0959 98 F (36.7 C)     Temp Source 02/28/22 0959 Oral     SpO2 02/28/22 0959 95 %     Weight --      Height --      Head Circumference --      Peak Flow --      Pain Score 02/28/22 1001 1     Pain Loc --      Pain Edu? --      Excl. in [place]? --     Most recent vital signs: Vitals:   02/28/22 1430 02/28/22 1527  BP: 129/74   Pulse:    Resp:    Temp:  98.4 F (36.9 C)  SpO2:      Physical Exam Constitutional:      Appearance: She is well-developed.  HENT:     Head: Atraumatic.  Eyes:     Conjunctiva/sclera: Conjunctivae normal.  Cardiovascular:     Rate and Rhythm: Regular rhythm.  Pulmonary:     Effort: No respiratory distress.  Abdominal:     General: There is no distension.     Tenderness: There is no abdominal tenderness.     Comments: Negative Murphy sign.  No abdominal tenderness to palpation   Musculoskeletal:         General: Normal range of motion.     Cervical back: Normal range of motion.  Skin:    General: Skin is warm.  Neurological:     Mental Status: She is alert. Mental status is at baseline.     Comments: Cranial nerves intact.  5/5 strength to bilateral upper and lower extremities.  Normal finger-to-nose.  Psychiatric:        Mood and Affect: Mood normal.          IMPRESSION / MDM / [place] / ED COURSE  I reviewed the triage vital signs and the nursing notes.  Differential diagnosis including sinusitis, CVA, viral illness including COVID/influenza, gastritis/PUD, atypical ACS, malignancy     RADIOLOGY I independently reviewed imaging, my interpretation of imaging: CT scan showed no signs of intracranial infarction.  Chest x-ray with left-sided concern for nodule.  Appears atypical for a left-sided pneumonia.  CT was read as acute maxillary sinusitis.  Chest x-ray was  read as acute pneumonia and recommended outpatient follow-up in the pulmonary nodule.      ED Results / Procedures / Treatments   Labs (all labs ordered are listed, but only abnormal results are displayed) Labs interpreted as -   COVID and influenza testing are negative.  UA without signs of urinary tract infection.  No significant electrolyte abnormalities.  Labs Reviewed  COMPREHENSIVE METABOLIC PANEL - Abnormal; Notable for the following components:      Result Value   Glucose, Bld 101 (*)    All other components within normal limits  URINALYSIS, ROUTINE W REFLEX MICROSCOPIC - Abnormal; Notable for the following components:   Color, Urine YELLOW (*)    APPearance CLEAR (*)    All other components within normal limits  RESP PANEL BY RT-PCR (FLU A&B, COVID) ARPGX2  LIPASE, BLOOD  CBC   Reevaluation patient continues to have a benign abdominal exam.  Patient is started on colchicine and indomethacin on a daily basis and this is likely Dr. Welton Flakes the episode.  Patient tolerating p.o.  History  the patient antibiotics to cover for acute sinusitis and community-acquired pneumonia.  The findings and the need for outpatient primary care physician for CT imaging and further work-up for possible underlying malignancy.    PROCEDURES:  Critical Care performed: No  Procedures  Patient's presentation is most consistent with acute presentation with potential threat to life or bodily function.   MEDICATIONS ORDERED IN ED: Medications  acetaminophen (TYLENOL) tablet 1,000 mg (0 mg Oral Hold 02/28/22 1433)  ondansetron (ZOFRAN-ODT) disintegrating tablet 4 mg (4 mg Oral Given 02/28/22 1245)    FINAL CLINICAL IMPRESSION(S) / ED DIAGNOSES   Final diagnoses:  Acute cough  Upper respiratory tract infection, unspecified type  Vomiting without nausea, unspecified vomiting type  Acute non-recurrent maxillary sinusitis  Pulmonary nodule  Community acquired pneumonia of left lung, unspecified part of lung     Rx / DC Orders   ED Discharge Orders          Ordered    amoxicillin-clavulanate (AUGMENTIN) 875-125 MG tablet  2 times daily        02/28/22 1423    ondansetron (ZOFRAN-ODT) 4 MG disintegrating tablet  Every 8 hours PRN        02/28/22 1423    doxycycline (ADOXA) 100 MG tablet  2 times daily        02/28/22 1510             Note:  This document was prepared using Dragon voice recognition software and may include unintentional dictation errors.   Corena Herter, MD 02/28/22 [phone]

## 2022-02-28 NOTE — Discharge Instructions (Addendum)
You were seen in the emergency department for cough, headache and not feeling well with an upset stomach.  You had a chest x-ray done like you had a pneumonia on the left side of your lungs.  You were started on 2 different antibiotics Augmentin, doxycycline.  Is importantly call primary care physician so you can have a follow-up visit next week.  You will need a repeat chest x-ray imaging after he completed your antibiotics to see that your pneumonia has improved and that there is no underlying cancer.  There was a pulmonary nodule noted on your chest x-ray needs to be CT scans by your primary care provider.  You had a CT scan of your head that showed sinus of a sinus infection.  You were started on antibiotic, take as prescribed.  For your upset stomach concerned that this is from all of the NSAIDs that you are taking at home with colchicine and indomethacin. Continue taking metoprolol as prescribed Stop taking colchicine -call your primary care physician and let them know that you believe this medicine was causing you to have an upset stomach Indomethacin -take once in the morning with breakfast.  Let your primary care physician know that we have changed the dose of this medication.  Zofran -you can 1 tablet every 8 hours as needed for nausea and vomiting.  Doxycycline - This medication can cause acid reflux.  It is important that you take it with food and drink plenty of water.  Do not lie down for 1 hour after taking this medication.  It also causes sun sensitivity so stay out of the sun or wear SPF while on this medication.  Return to the emergency department if you have any worsening symptoms.  Stay hydrated and drink plenty of fluids.

## 2022-02-28 NOTE — ED Triage Notes (Signed)
Pt to ED via POV from home. Pt reports HA, blurry vision, N/D, and dry cough x3 days. Pt reports seen PCP and prescribed medicine but doesn't seem to be working.

## 2022-03-06 ENCOUNTER — Emergency Department (HOSPITAL_COMMUNITY): Payer: Medicare Other | Admitting: Registered Nurse

## 2022-03-06 ENCOUNTER — Encounter (HOSPITAL_COMMUNITY): Admission: EM | Disposition: E | Payer: Self-pay | Source: Home / Self Care | Attending: Neurology

## 2022-03-06 ENCOUNTER — Emergency Department (HOSPITAL_COMMUNITY): Payer: Medicare Other

## 2022-03-06 ENCOUNTER — Inpatient Hospital Stay (HOSPITAL_COMMUNITY)
Admission: EM | Admit: 2022-03-06 | Discharge: 2022-03-25 | DRG: 003 | Disposition: E | Payer: Medicare Other | Attending: Internal Medicine | Admitting: Internal Medicine

## 2022-03-06 ENCOUNTER — Inpatient Hospital Stay (HOSPITAL_COMMUNITY): Payer: Medicare Other

## 2022-03-06 ENCOUNTER — Encounter (HOSPITAL_COMMUNITY): Payer: Self-pay

## 2022-03-06 DIAGNOSIS — R1310 Dysphagia, unspecified: Secondary | ICD-10-CM | POA: Diagnosis not present

## 2022-03-06 DIAGNOSIS — I080 Rheumatic disorders of both mitral and aortic valves: Secondary | ICD-10-CM | POA: Diagnosis present

## 2022-03-06 DIAGNOSIS — R29818 Other symptoms and signs involving the nervous system: Secondary | ICD-10-CM | POA: Diagnosis not present

## 2022-03-06 DIAGNOSIS — I63311 Cerebral infarction due to thrombosis of right middle cerebral artery: Secondary | ICD-10-CM

## 2022-03-06 DIAGNOSIS — I6601 Occlusion and stenosis of right middle cerebral artery: Secondary | ICD-10-CM | POA: Diagnosis not present

## 2022-03-06 DIAGNOSIS — I351 Nonrheumatic aortic (valve) insufficiency: Secondary | ICD-10-CM | POA: Diagnosis not present

## 2022-03-06 DIAGNOSIS — R918 Other nonspecific abnormal finding of lung field: Secondary | ICD-10-CM | POA: Diagnosis not present

## 2022-03-06 DIAGNOSIS — R34 Anuria and oliguria: Secondary | ICD-10-CM | POA: Diagnosis not present

## 2022-03-06 DIAGNOSIS — D696 Thrombocytopenia, unspecified: Secondary | ICD-10-CM | POA: Diagnosis not present

## 2022-03-06 DIAGNOSIS — I3139 Other pericardial effusion (noninflammatory): Secondary | ICD-10-CM | POA: Diagnosis present

## 2022-03-06 DIAGNOSIS — I700 Atherosclerosis of aorta: Secondary | ICD-10-CM | POA: Diagnosis not present

## 2022-03-06 DIAGNOSIS — J9621 Acute and chronic respiratory failure with hypoxia: Secondary | ICD-10-CM | POA: Diagnosis present

## 2022-03-06 DIAGNOSIS — Z9889 Other specified postprocedural states: Secondary | ICD-10-CM | POA: Diagnosis not present

## 2022-03-06 DIAGNOSIS — D638 Anemia in other chronic diseases classified elsewhere: Secondary | ICD-10-CM | POA: Diagnosis present

## 2022-03-06 DIAGNOSIS — E8770 Fluid overload, unspecified: Secondary | ICD-10-CM | POA: Diagnosis not present

## 2022-03-06 DIAGNOSIS — I63411 Cerebral infarction due to embolism of right middle cerebral artery: Principal | ICD-10-CM | POA: Diagnosis present

## 2022-03-06 DIAGNOSIS — I6389 Other cerebral infarction: Secondary | ICD-10-CM | POA: Diagnosis not present

## 2022-03-06 DIAGNOSIS — Z4659 Encounter for fitting and adjustment of other gastrointestinal appliance and device: Secondary | ICD-10-CM | POA: Diagnosis not present

## 2022-03-06 DIAGNOSIS — J690 Pneumonitis due to inhalation of food and vomit: Secondary | ICD-10-CM | POA: Diagnosis not present

## 2022-03-06 DIAGNOSIS — R29810 Facial weakness: Secondary | ICD-10-CM | POA: Diagnosis not present

## 2022-03-06 DIAGNOSIS — I10 Essential (primary) hypertension: Secondary | ICD-10-CM

## 2022-03-06 DIAGNOSIS — J9602 Acute respiratory failure with hypercapnia: Secondary | ICD-10-CM | POA: Diagnosis not present

## 2022-03-06 DIAGNOSIS — R6889 Other general symptoms and signs: Secondary | ICD-10-CM | POA: Diagnosis not present

## 2022-03-06 DIAGNOSIS — Z9049 Acquired absence of other specified parts of digestive tract: Secondary | ICD-10-CM

## 2022-03-06 DIAGNOSIS — I2510 Atherosclerotic heart disease of native coronary artery without angina pectoris: Secondary | ICD-10-CM | POA: Diagnosis not present

## 2022-03-06 DIAGNOSIS — R54 Age-related physical debility: Secondary | ICD-10-CM | POA: Diagnosis not present

## 2022-03-06 DIAGNOSIS — D72829 Elevated white blood cell count, unspecified: Secondary | ICD-10-CM | POA: Diagnosis not present

## 2022-03-06 DIAGNOSIS — R339 Retention of urine, unspecified: Secondary | ICD-10-CM | POA: Diagnosis not present

## 2022-03-06 DIAGNOSIS — I959 Hypotension, unspecified: Secondary | ICD-10-CM | POA: Diagnosis present

## 2022-03-06 DIAGNOSIS — Z515 Encounter for palliative care: Secondary | ICD-10-CM | POA: Diagnosis not present

## 2022-03-06 DIAGNOSIS — I4510 Unspecified right bundle-branch block: Secondary | ICD-10-CM | POA: Diagnosis present

## 2022-03-06 DIAGNOSIS — J189 Pneumonia, unspecified organism: Secondary | ICD-10-CM

## 2022-03-06 DIAGNOSIS — Z7901 Long term (current) use of anticoagulants: Secondary | ICD-10-CM

## 2022-03-06 DIAGNOSIS — I480 Paroxysmal atrial fibrillation: Secondary | ICD-10-CM | POA: Diagnosis not present

## 2022-03-06 DIAGNOSIS — J9601 Acute respiratory failure with hypoxia: Secondary | ICD-10-CM | POA: Diagnosis present

## 2022-03-06 DIAGNOSIS — Z743 Need for continuous supervision: Secondary | ICD-10-CM | POA: Diagnosis not present

## 2022-03-06 DIAGNOSIS — I4819 Other persistent atrial fibrillation: Secondary | ICD-10-CM | POA: Diagnosis not present

## 2022-03-06 DIAGNOSIS — Z7189 Other specified counseling: Secondary | ICD-10-CM | POA: Diagnosis not present

## 2022-03-06 DIAGNOSIS — Z66 Do not resuscitate: Secondary | ICD-10-CM | POA: Diagnosis not present

## 2022-03-06 DIAGNOSIS — J90 Pleural effusion, not elsewhere classified: Secondary | ICD-10-CM | POA: Diagnosis not present

## 2022-03-06 DIAGNOSIS — R414 Neurologic neglect syndrome: Secondary | ICD-10-CM | POA: Diagnosis not present

## 2022-03-06 DIAGNOSIS — R443 Hallucinations, unspecified: Secondary | ICD-10-CM | POA: Diagnosis present

## 2022-03-06 DIAGNOSIS — E440 Moderate protein-calorie malnutrition: Secondary | ICD-10-CM | POA: Diagnosis not present

## 2022-03-06 DIAGNOSIS — G9389 Other specified disorders of brain: Secondary | ICD-10-CM | POA: Diagnosis not present

## 2022-03-06 DIAGNOSIS — K219 Gastro-esophageal reflux disease without esophagitis: Secondary | ICD-10-CM | POA: Diagnosis present

## 2022-03-06 DIAGNOSIS — I517 Cardiomegaly: Secondary | ICD-10-CM | POA: Diagnosis not present

## 2022-03-06 DIAGNOSIS — J810 Acute pulmonary edema: Secondary | ICD-10-CM | POA: Diagnosis not present

## 2022-03-06 DIAGNOSIS — Z4682 Encounter for fitting and adjustment of non-vascular catheter: Secondary | ICD-10-CM | POA: Diagnosis not present

## 2022-03-06 DIAGNOSIS — Z888 Allergy status to other drugs, medicaments and biological substances status: Secondary | ICD-10-CM

## 2022-03-06 DIAGNOSIS — Z79891 Long term (current) use of opiate analgesic: Secondary | ICD-10-CM

## 2022-03-06 DIAGNOSIS — I609 Nontraumatic subarachnoid hemorrhage, unspecified: Secondary | ICD-10-CM | POA: Diagnosis present

## 2022-03-06 DIAGNOSIS — I63511 Cerebral infarction due to unspecified occlusion or stenosis of right middle cerebral artery: Secondary | ICD-10-CM | POA: Diagnosis not present

## 2022-03-06 DIAGNOSIS — G8194 Hemiplegia, unspecified affecting left nondominant side: Secondary | ICD-10-CM | POA: Diagnosis present

## 2022-03-06 DIAGNOSIS — I499 Cardiac arrhythmia, unspecified: Secondary | ICD-10-CM | POA: Diagnosis not present

## 2022-03-06 DIAGNOSIS — Z86718 Personal history of other venous thrombosis and embolism: Secondary | ICD-10-CM

## 2022-03-06 DIAGNOSIS — I471 Supraventricular tachycardia, unspecified: Secondary | ICD-10-CM | POA: Diagnosis not present

## 2022-03-06 DIAGNOSIS — I4891 Unspecified atrial fibrillation: Secondary | ICD-10-CM | POA: Diagnosis not present

## 2022-03-06 DIAGNOSIS — R29715 NIHSS score 15: Secondary | ICD-10-CM | POA: Diagnosis present

## 2022-03-06 DIAGNOSIS — I639 Cerebral infarction, unspecified: Secondary | ICD-10-CM | POA: Diagnosis not present

## 2022-03-06 DIAGNOSIS — E785 Hyperlipidemia, unspecified: Secondary | ICD-10-CM | POA: Diagnosis present

## 2022-03-06 DIAGNOSIS — J9622 Acute and chronic respiratory failure with hypercapnia: Secondary | ICD-10-CM | POA: Diagnosis not present

## 2022-03-06 DIAGNOSIS — Z6820 Body mass index (BMI) 20.0-20.9, adult: Secondary | ICD-10-CM

## 2022-03-06 DIAGNOSIS — R601 Generalized edema: Secondary | ICD-10-CM | POA: Diagnosis not present

## 2022-03-06 DIAGNOSIS — E876 Hypokalemia: Secondary | ICD-10-CM | POA: Diagnosis present

## 2022-03-06 DIAGNOSIS — Z79899 Other long term (current) drug therapy: Secondary | ICD-10-CM

## 2022-03-06 DIAGNOSIS — Z8249 Family history of ischemic heart disease and other diseases of the circulatory system: Secondary | ICD-10-CM

## 2022-03-06 DIAGNOSIS — R6339 Other feeding difficulties: Secondary | ICD-10-CM | POA: Diagnosis not present

## 2022-03-06 DIAGNOSIS — I340 Nonrheumatic mitral (valve) insufficiency: Secondary | ICD-10-CM | POA: Diagnosis not present

## 2022-03-06 DIAGNOSIS — Z91148 Patient's other noncompliance with medication regimen for other reason: Secondary | ICD-10-CM

## 2022-03-06 DIAGNOSIS — Z7982 Long term (current) use of aspirin: Secondary | ICD-10-CM

## 2022-03-06 HISTORY — PX: IR PERCUTANEOUS ART THROMBECTOMY/INFUSION INTRACRANIAL INC DIAG ANGIO: IMG6087

## 2022-03-06 HISTORY — PX: IR CT HEAD LTD: IMG2386

## 2022-03-06 HISTORY — PX: RADIOLOGY WITH ANESTHESIA: SHX6223

## 2022-03-06 HISTORY — PX: IR ANGIO VERTEBRAL SEL VERTEBRAL UNI R MOD SED: IMG5368

## 2022-03-06 LAB — CBG MONITORING, ED: Glucose-Capillary: 105 mg/dL — ABNORMAL HIGH (ref 70–99)

## 2022-03-06 LAB — DIFFERENTIAL
Abs Immature Granulocytes: 0.04 10*3/uL (ref 0.00–0.07)
Basophils Absolute: 0.1 10*3/uL (ref 0.0–0.1)
Basophils Relative: 1 %
Eosinophils Absolute: 0.1 10*3/uL (ref 0.0–0.5)
Eosinophils Relative: 1 %
Immature Granulocytes: 0 %
Lymphocytes Relative: 16 %
Lymphs Abs: 1.7 10*3/uL (ref 0.7–4.0)
Monocytes Absolute: 1 10*3/uL (ref 0.1–1.0)
Monocytes Relative: 9 %
Neutro Abs: 8.1 10*3/uL — ABNORMAL HIGH (ref 1.7–7.7)
Neutrophils Relative %: 73 %

## 2022-03-06 LAB — COMPREHENSIVE METABOLIC PANEL
ALT: 23 U/L (ref 0–44)
AST: 60 U/L — ABNORMAL HIGH (ref 15–41)
Albumin: 3.5 g/dL (ref 3.5–5.0)
Alkaline Phosphatase: 61 U/L (ref 38–126)
Anion gap: 14 (ref 5–15)
BUN: 18 mg/dL (ref 8–23)
CO2: 25 mmol/L (ref 22–32)
Calcium: 9.4 mg/dL (ref 8.9–10.3)
Chloride: 101 mmol/L (ref 98–111)
Creatinine, Ser: 0.87 mg/dL (ref 0.44–1.00)
GFR, Estimated: >60 mL/min (ref >60)
Glucose, Bld: 108 mg/dL — ABNORMAL HIGH (ref 70–99)
Potassium: 4 mmol/L (ref 3.5–5.1)
Sodium: 140 mmol/L (ref 135–145)
Total Bilirubin: 1.1 mg/dL (ref 0.3–1.2)
Total Protein: 6.4 g/dL — ABNORMAL LOW (ref 6.5–8.1)

## 2022-03-06 LAB — POCT I-STAT 7, (LYTES, BLD GAS, ICA,H+H)
Acid-base deficit: 2 mmol/L (ref 0.0–2.0)
Bicarbonate: 22 mmol/L (ref 20.0–28.0)
Calcium, Ion: 1.17 mmol/L (ref 1.15–1.40)
HCT: 33 % — ABNORMAL LOW (ref 36.0–46.0)
Hemoglobin: 11.2 g/dL — ABNORMAL LOW (ref 12.0–15.0)
O2 Saturation: 100 %
Patient temperature: 98.4
Potassium: 3.4 mmol/L — ABNORMAL LOW (ref 3.5–5.1)
Sodium: 137 mmol/L (ref 135–145)
TCO2: 23 mmol/L (ref 22–32)
pCO2 arterial: 33.9 mmHg (ref 32–48)
pH, Arterial: 7.419 (ref 7.35–7.45)
pO2, Arterial: 273 mmHg — ABNORMAL HIGH (ref 83–108)

## 2022-03-06 LAB — CBC
HCT: 42.5 % (ref 36.0–46.0)
Hemoglobin: 14.2 g/dL (ref 12.0–15.0)
MCH: 28.5 pg (ref 26.0–34.0)
MCHC: 33.4 g/dL (ref 30.0–36.0)
MCV: 85.3 fL (ref 80.0–100.0)
Platelets: 104 10*3/uL — ABNORMAL LOW (ref 150–400)
RBC: 4.98 MIL/uL (ref 3.87–5.11)
RDW: 13.3 % (ref 11.5–15.5)
WBC: 11.1 10*3/uL — ABNORMAL HIGH (ref 4.0–10.5)
nRBC: 0 % (ref 0.0–0.2)

## 2022-03-06 LAB — I-STAT CHEM 8, ED
BUN: 19 mg/dL (ref 8–23)
Calcium, Ion: 1.06 mmol/L — ABNORMAL LOW (ref 1.15–1.40)
Chloride: 102 mmol/L (ref 98–111)
Creatinine, Ser: 0.8 mg/dL (ref 0.44–1.00)
Glucose, Bld: 104 mg/dL — ABNORMAL HIGH (ref 70–99)
HCT: 43 % (ref 36.0–46.0)
Hemoglobin: 14.6 g/dL (ref 12.0–15.0)
Potassium: 4 mmol/L (ref 3.5–5.1)
Sodium: 140 mmol/L (ref 135–145)
TCO2: 24 mmol/L (ref 22–32)

## 2022-03-06 LAB — MRSA NEXT GEN BY PCR, NASAL: MRSA by PCR Next Gen: NOT DETECTED

## 2022-03-06 LAB — PROTIME-INR
INR: 1.8 — ABNORMAL HIGH (ref 0.8–1.2)
Prothrombin Time: 20.6 seconds — ABNORMAL HIGH (ref 11.4–15.2)

## 2022-03-06 LAB — ETHANOL: Alcohol, Ethyl (B): <10 mg/dL (ref <10)

## 2022-03-06 LAB — APTT: aPTT: 29 seconds (ref 24–36)

## 2022-03-06 SURGERY — IR WITH ANESTHESIA
Anesthesia: General

## 2022-03-06 MED ORDER — FENTANYL BOLUS VIA INFUSION
25.0000 ug | INTRAVENOUS | Status: DC | PRN
  Administered 2022-03-07: 25 ug via INTRAVENOUS

## 2022-03-06 MED ORDER — CLEVIDIPINE BUTYRATE 0.5 MG/ML IV EMUL
INTRAVENOUS | Status: AC
  Filled 2022-03-06: qty 50

## 2022-03-06 MED ORDER — PHENYLEPHRINE 80 MCG/ML (10ML) SYRINGE FOR IV PUSH (FOR BLOOD PRESSURE SUPPORT)
PREFILLED_SYRINGE | INTRAVENOUS | Status: DC | PRN
  Administered 2022-03-06 (×4): 80 ug via INTRAVENOUS
  Administered 2022-03-06: 160 ug via INTRAVENOUS
  Administered 2022-03-06: 80 ug via INTRAVENOUS

## 2022-03-06 MED ORDER — POLYETHYLENE GLYCOL 3350 17 G PO PACK
17.0000 g | PACK | Freq: Every day | ORAL | Status: DC
  Administered 2022-03-07 – 2022-03-09 (×3): 17 g
  Filled 2022-03-06 (×3): qty 1

## 2022-03-06 MED ORDER — SODIUM CHLORIDE 0.9 % IV SOLN: INTRAVENOUS | Status: DC | PRN

## 2022-03-06 MED ORDER — DILTIAZEM LOAD VIA INFUSION: 10.0000 mg | Freq: Once | INTRAVENOUS | Status: DC

## 2022-03-06 MED ORDER — PROPOFOL 10 MG/ML IV BOLUS
INTRAVENOUS | Status: DC | PRN
  Administered 2022-03-06: 70 mg via INTRAVENOUS
  Administered 2022-03-06: 30 mg via INTRAVENOUS

## 2022-03-06 MED ORDER — SODIUM CHLORIDE 0.9 % IV SOLN
250.0000 mL | INTRAVENOUS | Status: DC
  Administered 2022-03-08: 250 mL via INTRAVENOUS
  Administered 2022-03-10: 500 mL via INTRAVENOUS
  Administered 2022-03-11: 250 mL via INTRAVENOUS

## 2022-03-06 MED ORDER — CLEVIDIPINE BUTYRATE 0.5 MG/ML IV EMUL
0.0000 mg/h | INTRAVENOUS | Status: DC
  Administered 2022-03-06: 2 mg/h via INTRAVENOUS

## 2022-03-06 MED ORDER — SUCCINYLCHOLINE CHLORIDE 200 MG/10ML IV SOSY
PREFILLED_SYRINGE | INTRAVENOUS | Status: DC | PRN
  Administered 2022-03-06: 100 mg via INTRAVENOUS

## 2022-03-06 MED ORDER — IOHEXOL 350 MG/ML SOLN
75.0000 mL | Freq: Once | INTRAVENOUS | Status: AC | PRN
  Administered 2022-03-06: 75 mL via INTRAVENOUS

## 2022-03-06 MED ORDER — LACTATED RINGERS IV SOLN: INTRAVENOUS | Status: DC | PRN

## 2022-03-06 MED ORDER — METOPROLOL TARTRATE 5 MG/5ML IV SOLN
INTRAVENOUS | Status: AC
  Administered 2022-03-06: 5 mg
  Filled 2022-03-06: qty 5

## 2022-03-06 MED ORDER — PANTOPRAZOLE SODIUM 40 MG IV SOLR
40.0000 mg | Freq: Every day | INTRAVENOUS | Status: DC
  Administered 2022-03-06 – 2022-03-14 (×9): 40 mg via INTRAVENOUS
  Filled 2022-03-06 (×9): qty 10

## 2022-03-06 MED ORDER — PHENYLEPHRINE HCL-NACL 20-0.9 MG/250ML-% IV SOLN
25.0000 ug/min | INTRAVENOUS | Status: DC
  Administered 2022-03-06: 25 ug/min via INTRAVENOUS
  Administered 2022-03-07: 160 ug/min via INTRAVENOUS
  Administered 2022-03-07: 140 ug/min via INTRAVENOUS
  Administered 2022-03-07: 160 ug/min via INTRAVENOUS
  Administered 2022-03-07: 130 ug/min via INTRAVENOUS
  Administered 2022-03-07: 100 ug/min via INTRAVENOUS
  Administered 2022-03-07: 150 ug/min via INTRAVENOUS
  Administered 2022-03-07: 120 ug/min via INTRAVENOUS
  Administered 2022-03-07: 140 ug/min via INTRAVENOUS
  Administered 2022-03-08 (×2): 150 ug/min via INTRAVENOUS
  Administered 2022-03-08: 25 ug/min via INTRAVENOUS
  Administered 2022-03-08: 150 ug/min via INTRAVENOUS
  Administered 2022-03-08: 20 ug/min via INTRAVENOUS
  Administered 2022-03-08: 150 ug/min via INTRAVENOUS
  Administered 2022-03-09: 25 ug/min via INTRAVENOUS
  Administered 2022-03-10: 30 ug/min via INTRAVENOUS
  Administered 2022-03-11: 10 ug/min via INTRAVENOUS
  Administered 2022-03-12: 30 ug/min via INTRAVENOUS
  Administered 2022-03-12: 20 ug/min via INTRAVENOUS
  Administered 2022-03-13: 24 ug/min via INTRAVENOUS
  Administered 2022-03-14: 20 ug/min via INTRAVENOUS
  Administered 2022-03-15: 14 ug/min via INTRAVENOUS
  Filled 2022-03-06 (×12): qty 250
  Filled 2022-03-06: qty 500
  Filled 2022-03-06 (×2): qty 250
  Filled 2022-03-06 (×2): qty 500
  Filled 2022-03-06 (×3): qty 250

## 2022-03-06 MED ORDER — SODIUM CHLORIDE 0.9% FLUSH: 3.0000 mL | Freq: Once | INTRAVENOUS | Status: DC

## 2022-03-06 MED ORDER — FENTANYL CITRATE PF 50 MCG/ML IJ SOSY
25.0000 ug | PREFILLED_SYRINGE | Freq: Once | INTRAMUSCULAR | Status: AC
  Administered 2022-03-06: 25 ug via INTRAVENOUS
  Filled 2022-03-06: qty 1

## 2022-03-06 MED ORDER — ESMOLOL HCL 100 MG/10ML IV SOLN
INTRAVENOUS | Status: DC | PRN
  Administered 2022-03-06 (×2): 30 mg via INTRAVENOUS
  Administered 2022-03-06: 10 mg via INTRAVENOUS
  Administered 2022-03-06: 30 mg via INTRAVENOUS

## 2022-03-06 MED ORDER — FENTANYL CITRATE PF 50 MCG/ML IJ SOSY: 25.0000 ug | PREFILLED_SYRINGE | INTRAMUSCULAR | Status: DC | PRN

## 2022-03-06 MED ORDER — DOCUSATE SODIUM 50 MG/5ML PO LIQD
100.0000 mg | Freq: Two times a day (BID) | ORAL | Status: DC
  Administered 2022-03-06: 100 mg
  Filled 2022-03-06: qty 10

## 2022-03-06 MED ORDER — ORAL CARE MOUTH RINSE: 15.0000 mL | OROMUCOSAL | Status: DC | PRN

## 2022-03-06 MED ORDER — IOHEXOL 300 MG/ML  SOLN
150.0000 mL | Freq: Once | INTRAMUSCULAR | Status: AC | PRN
  Administered 2022-03-06: 100 mL via INTRA_ARTERIAL

## 2022-03-06 MED ORDER — CEFAZOLIN SODIUM-DEXTROSE 2-3 GM-%(50ML) IV SOLR
INTRAVENOUS | Status: DC | PRN
  Administered 2022-03-06: 2 g via INTRAVENOUS

## 2022-03-06 MED ORDER — ACETAMINOPHEN 160 MG/5ML PO SOLN: 650.0000 mg | ORAL | Status: DC | PRN

## 2022-03-06 MED ORDER — SODIUM CHLORIDE 0.9 % IV SOLN: INTRAVENOUS | Status: DC

## 2022-03-06 MED ORDER — LIDOCAINE 2% (20 MG/ML) 5 ML SYRINGE
INTRAMUSCULAR | Status: DC | PRN
  Administered 2022-03-06: 100 mg via INTRAVENOUS

## 2022-03-06 MED ORDER — FENTANYL 2500MCG IN NS 250ML (10MCG/ML) PREMIX INFUSION
25.0000 ug/h | INTRAVENOUS | Status: DC
  Administered 2022-03-06: 25 ug/h via INTRAVENOUS
  Filled 2022-03-06: qty 250

## 2022-03-06 MED ORDER — METOPROLOL TARTRATE 5 MG/5ML IV SOLN
INTRAVENOUS | Status: AC
  Filled 2022-03-06: qty 5

## 2022-03-06 MED ORDER — ORAL CARE MOUTH RINSE
15.0000 mL | OROMUCOSAL | Status: DC
  Administered 2022-03-06 – 2022-03-16 (×109): 15 mL via OROMUCOSAL

## 2022-03-06 MED ORDER — LACTATED RINGERS IV BOLUS
500.0000 mL | Freq: Once | INTRAVENOUS | Status: AC
  Administered 2022-03-06: 500 mL via INTRAVENOUS

## 2022-03-06 MED ORDER — ATORVASTATIN CALCIUM 10 MG PO TABS
10.0000 mg | ORAL_TABLET | Freq: Every day | ORAL | Status: DC
  Administered 2022-03-07 – 2022-03-15 (×9): 10 mg
  Filled 2022-03-06 (×9): qty 1

## 2022-03-06 MED ORDER — FENTANYL CITRATE PF 50 MCG/ML IJ SOSY
25.0000 ug | PREFILLED_SYRINGE | INTRAMUSCULAR | Status: DC | PRN
  Administered 2022-03-06: 25 ug via INTRAVENOUS
  Filled 2022-03-06 (×2): qty 1

## 2022-03-06 MED ORDER — DOCUSATE SODIUM 50 MG/5ML PO LIQD
100.0000 mg | Freq: Two times a day (BID) | ORAL | Status: DC
  Administered 2022-03-07 – 2022-03-09 (×6): 100 mg
  Filled 2022-03-06 (×6): qty 10

## 2022-03-06 MED ORDER — PHENYLEPHRINE HCL-NACL 20-0.9 MG/250ML-% IV SOLN
INTRAVENOUS | Status: AC
  Filled 2022-03-06: qty 250

## 2022-03-06 MED ORDER — PROPOFOL 1000 MG/100ML IV EMUL
0.0000 ug/kg/min | INTRAVENOUS | Status: DC
  Administered 2022-03-06: 40 ug/kg/min via INTRAVENOUS
  Administered 2022-03-07: 30 ug/kg/min via INTRAVENOUS
  Administered 2022-03-08: 10 ug/kg/min via INTRAVENOUS
  Filled 2022-03-06 (×4): qty 100

## 2022-03-06 MED ORDER — DILTIAZEM HCL-DEXTROSE 125-5 MG/125ML-% IV SOLN (PREMIX)
5.0000 mg/h | INTRAVENOUS | Status: DC
  Filled 2022-03-06: qty 125

## 2022-03-06 MED ORDER — ACETAMINOPHEN 650 MG RE SUPP: 650.0000 mg | RECTAL | Status: DC | PRN

## 2022-03-06 MED ORDER — STROKE: EARLY STAGES OF RECOVERY BOOK
Freq: Once | Status: AC
  Filled 2022-03-06: qty 1

## 2022-03-06 MED ORDER — DILTIAZEM HCL-DEXTROSE 125-5 MG/125ML-% IV SOLN (PREMIX)
5.0000 mg/h | INTRAVENOUS | Status: DC
  Administered 2022-03-06: 5 mg/h via INTRAVENOUS
  Filled 2022-03-06 (×2): qty 125

## 2022-03-06 MED ORDER — ACETAMINOPHEN 325 MG PO TABS: 650.0000 mg | ORAL_TABLET | ORAL | Status: DC | PRN

## 2022-03-06 MED ORDER — SENNOSIDES-DOCUSATE SODIUM 8.6-50 MG PO TABS
1.0000 | ORAL_TABLET | Freq: Every evening | ORAL | Status: DC | PRN
  Filled 2022-03-06: qty 1

## 2022-03-06 MED ORDER — NITROGLYCERIN 1 MG/10 ML FOR IR/CATH LAB
INTRA_ARTERIAL | Status: AC
  Administered 2022-03-06 (×4): 25 ug via INTRA_ARTERIAL
  Filled 2022-03-06: qty 10

## 2022-03-06 MED ORDER — AMIODARONE HCL IN DEXTROSE 360-4.14 MG/200ML-% IV SOLN
INTRAVENOUS | Status: AC
  Filled 2022-03-06: qty 200

## 2022-03-06 MED ORDER — CEFAZOLIN SODIUM-DEXTROSE 2-4 GM/100ML-% IV SOLN
INTRAVENOUS | Status: AC
  Filled 2022-03-06: qty 100

## 2022-03-06 MED ORDER — CHLORHEXIDINE GLUCONATE CLOTH 2 % EX PADS
6.0000 | MEDICATED_PAD | Freq: Every day | CUTANEOUS | Status: DC
  Administered 2022-03-06 – 2022-03-15 (×10): 6 via TOPICAL

## 2022-03-06 MED ORDER — POLYETHYLENE GLYCOL 3350 17 G PO PACK
17.0000 g | PACK | Freq: Every day | ORAL | Status: DC
  Administered 2022-03-06: 17 g
  Filled 2022-03-06: qty 1

## 2022-03-06 MED ORDER — PROPOFOL 500 MG/50ML IV EMUL
INTRAVENOUS | Status: DC | PRN
  Administered 2022-03-06: 40 ug/kg/min via INTRAVENOUS
  Administered 2022-03-06: 50 ug/kg/min via INTRAVENOUS

## 2022-03-06 MED ORDER — METOPROLOL TARTRATE 5 MG/5ML IV SOLN
INTRAVENOUS | Status: DC | PRN
  Administered 2022-03-06 (×2): 2 mg via INTRAVENOUS
  Administered 2022-03-06: 1 mg via INTRAVENOUS

## 2022-03-06 MED ORDER — ENOXAPARIN SODIUM 40 MG/0.4ML IJ SOSY: 40.0000 mg | PREFILLED_SYRINGE | INTRAMUSCULAR | Status: DC

## 2022-03-06 MED ORDER — ROCURONIUM BROMIDE 10 MG/ML (PF) SYRINGE
PREFILLED_SYRINGE | INTRAVENOUS | Status: DC | PRN
  Administered 2022-03-06: 40 mg via INTRAVENOUS
  Administered 2022-03-06: 60 mg via INTRAVENOUS

## 2022-03-06 MED ORDER — CLEVIDIPINE BUTYRATE 0.5 MG/ML IV EMUL: 0.0000 mg/h | INTRAVENOUS | Status: DC

## 2022-03-06 MED ORDER — LORAZEPAM 0.5 MG PO TABS
0.5000 mg | ORAL_TABLET | ORAL | Status: DC | PRN
  Administered 2022-03-09 – 2022-03-12 (×3): 0.5 mg
  Filled 2022-03-06 (×3): qty 1

## 2022-03-06 MED ORDER — ACETAMINOPHEN 160 MG/5ML PO SOLN
650.0000 mg | ORAL | Status: DC | PRN
  Administered 2022-03-09 – 2022-03-15 (×4): 650 mg
  Filled 2022-03-06 (×4): qty 20.3

## 2022-03-06 MED ORDER — PHENYLEPHRINE HCL-NACL 20-0.9 MG/250ML-% IV SOLN
INTRAVENOUS | Status: DC | PRN
  Administered 2022-03-06: 50 ug/min via INTRAVENOUS

## 2022-03-06 NOTE — Progress Notes (Signed)
Per RN, spoke to Dr Sal and not to do scan because no new neuro changes. MRI at midnight. On hold until further notice.

## 2022-03-06 NOTE — Transfer of Care (Signed)
Immediate Anesthesia Transfer of Care Note  Patient: Sherry Olson  Procedure(s) Performed: IR WITH ANESTHESIA  Patient Location: PACU and ICU  Anesthesia Type:General  Level of Consciousness: sedated and Patient remains intubated per anesthesia plan  Airway & Oxygen Therapy: Patient remains intubated per anesthesia plan and Patient placed on Ventilator (see vital sign flow sheet for setting)  Post-op Assessment: Report given to RN and Post -op Vital signs reviewed and stable  Post vital signs: Reviewed and stable  Last Vitals:  Vitals Value Taken Time  BP    Temp    Pulse 88 03/07/2022 1740  Resp 17 03/18/2022 1743  SpO2 100 % 03/18/2022 1740  Vitals shown include unvalidated device data.  Last Pain:  Vitals:   02/27/2022 1519  PainSc: 0-No pain         Complications: No notable events documented.

## 2022-03-06 NOTE — ED Provider Notes (Signed)
[facility] Provider Note   CSN: [id number] Arrival date & time: 03/22/2022  1445  An emergency department physician performed an initial assessment on this suspected stroke patient at 1445.  History  Chief Complaint  Patient presents with   Code Stroke    Sherry Olson is a 76 y.o. female.  HPI   76 year old female presenting to the emergency department with concern for stroke.  The patient was last known normal around 1415 and was found to have left-sided weakness and facial droop with some repetitive speech.  She had been somewhat confused over the past couple of days but had sudden onset left-sided weakness and right gaze deviation.  She is currently on Eliquis.  Is unclear if she is currently on the medication.  She did arrive to the emergency department as a code stroke and was quickly  taken to the [facility] with neurology bedside for code stroke imaging.  Home Medications Prior to Admission medications   Medication Sig Start Date End Date Taking? Authorizing Provider  colchicine 0.6 MG tablet Take 0.6 mg by mouth daily. 02/21/22  Yes [provider]  doxycycline (VIBRA-TABS) 100 MG tablet Take 100 mg by mouth 2 (two) times daily. 02/28/22  Yes [provider]  ELIQUIS 5 MG TABS tablet Take 5 mg by mouth 2 (two) times daily. 02/04/22  Yes [provider]  indomethacin (INDOCIN) 25 MG capsule Take 25 mg by mouth 2 (two) times daily. 02/21/22  Yes [provider]  levofloxacin (LEVAQUIN) 500 MG tablet Take 500 mg by mouth daily. 01/24/22  Yes [provider]  metoprolol tartrate (LOPRESSOR) 100 MG tablet Take 100 mg by mouth 2 (two) times daily. 02/10/22  Yes [provider]  montelukast (SINGULAIR) 10 MG tablet Take 10 mg by mouth daily. 01/18/22  Yes [provider]  sotalol (BETAPACE) 80 MG tablet Take 80 mg by mouth 2 (two) times daily. 02/04/22  Yes [provider]   traMADol (ULTRAM) 50 MG tablet Take 50 mg by mouth 2 (two) times daily as needed. 01/21/22  Yes [provider]  amLODipine (NORVASC) 5 MG tablet Take 5 mg by mouth daily.    [provider]  amoxicillin-clavulanate (AUGMENTIN) 875-125 MG tablet Take 1 tablet by mouth 2 (two) times daily for 7 days. 02/28/22 03/07/22  Nathaniel Man, MD  aspirin 81 MG chewable tablet Chew by mouth daily.    [provider]  atorvastatin (LIPITOR) 10 MG tablet Take 10 mg by mouth daily.    [provider]  benazepril (LOTENSIN) 20 MG tablet Take 20 mg by mouth daily.    [provider]  Calcium Carbonate (CALCIUM 600 PO) Take by mouth daily.    [provider]  cholecalciferol (VITAMIN D) 1000 units tablet Take 1,000 Units by mouth daily.    [provider]  CITALOPRAM HYDROBROMIDE PO Take by mouth.    [provider]  dicyclomine (BENTYL) 10 MG capsule Take 10 mg by mouth 4 (four) times daily -  before meals and at bedtime. Patient not taking: Reported on 02/26/2021    [provider]  doxycycline (ADOXA) 100 MG tablet Take 1 tablet (100 mg total) by mouth 2 (two) times daily for 7 days. 02/28/22 03/07/22  Nathaniel Man, MD  FLUTICASONE PROPIONATE EX Apply topically. Patient not taking: Reported on 02/26/2021    [provider]  furosemide (LASIX) 20 MG tablet Take 20 mg by mouth daily. Patient not taking:  Reported on 02/26/2021    [provider]  LORazepam (ATIVAN) 0.5 MG tablet Take 0.5 mg by mouth as needed for anxiety.    [provider]  METOPROLOL SUCCINATE PO Take by mouth.    [provider]  omeprazole (PRILOSEC) 40 MG capsule Take 40 mg by mouth daily.    [provider]  ondansetron (ZOFRAN-ODT) 4 MG disintegrating tablet Take 1 tablet (4 mg total) by mouth every 8 (eight) hours as needed for nausea or vomiting. 02/28/22   Nathaniel Man, MD      Allergies    Amiodarone,  Colesevelam, and Welchol [colesevelam hcl]    Review of Systems   Review of Systems  Unable to perform ROS: Acuity of condition    Physical Exam Updated Vital Signs BP 125/85   Pulse (!) 104   Resp 16   Ht 5\' 3"  (1.6 m)   Wt 51.8 kg   SpO2 (!) 86%   BMI 20.23 kg/m  Physical Exam Vitals and nursing note reviewed.  Constitutional:      Comments: Protecting airway, ABC intact  HENT:     Head: Normocephalic and atraumatic.  Eyes:     General: No scleral icterus.    Conjunctiva/sclera: Conjunctivae normal.  Cardiovascular:     Rate and Rhythm: Regular rhythm. Tachycardia present.  Pulmonary:     Effort: Pulmonary effort is normal. No respiratory distress.  Abdominal:     General: There is no distension.     Tenderness: There is no guarding.  Musculoskeletal:        General: No deformity or signs of injury.     Cervical back: Neck supple.  Skin:    Findings: No lesion or rash.  Neurological:     Mental Status: She is alert.     Comments: Alert, full neuro exam deferred to neurology as they are currently in the CT scanner with neurology     ED Results / Procedures / Treatments   Labs (all labs ordered are listed, but only abnormal results are displayed) Labs Reviewed  PROTIME-INR - Abnormal; Notable for the following components:      Result Value   Prothrombin Time 20.6 (*)    INR 1.8 (*)    All other components within normal limits  CBC - Abnormal; Notable for the following components:   WBC 11.1 (*)    Platelets 104 (*)    All other components within normal limits  DIFFERENTIAL - Abnormal; Notable for the following components:   Neutro Abs 8.1 (*)    All other components within normal limits  COMPREHENSIVE METABOLIC PANEL - Abnormal; Notable for the following components:   Glucose, Bld 108 (*)    Total Protein 6.4 (*)    AST 60 (*)    All other components within normal limits  I-STAT CHEM 8, ED - Abnormal; Notable for the following components:   Glucose,  Bld 104 (*)    Calcium, Ion 1.06 (*)    All other components within normal limits  CBG MONITORING, ED - Abnormal; Notable for the following components:   Glucose-Capillary 105 (*)    All other components within normal limits  POCT I-STAT 7, (LYTES, BLD GAS, ICA,H+H) - Abnormal; Notable for the following components:   pO2, Arterial 273 (*)    Potassium 3.4 (*)    HCT 33.0 (*)    Hemoglobin 11.2 (*)    All other components within normal limits  MRSA NEXT GEN BY PCR, NASAL  APTT  ETHANOL  HEMOGLOBIN A1C  LIPID PANEL  CBC  COMPREHENSIVE METABOLIC PANEL  CBC WITH DIFFERENTIAL/PLATELET    EKG None  Radiology DG Abd 1 View  Result Date: 03/20/2022 CLINICAL DATA:  [id number] Encounter for orogastric (OG) tube placement [id number] EXAM: ABDOMEN - 1 VIEW; PORTABLE CHEST - 1 VIEW COMPARISON:  None Available. FINDINGS: Enteric tube courses below the hemidiaphragm with tip and side port overlying the expected region of the gastric lumen. Enlarged cardiac silhouette with some component likely due to AP portable technique. The heart and mediastinal contours are within normal limits. Aortic calcification. Consolidative airspace opacity of the left upper lobe. No pulmonary edema. No pleural effusion. No pneumothorax. The bowel gas pattern is normal. Right upper quadrant clips noted. Bowel anastomotic staple sutures noted overlying the pelvis. Excretion of intravenous contrast within bilateral renal pelvis sees and urinary bladder lumen. No radio-opaque calculi or other significant radiographic abnormality are seen. No acute osseous abnormality. IMPRESSION: 1. Consolidative airspace opacity of the left upper lobe. Followup PA and lateral chest X-ray is recommended in 3-4 weeks following therapy to ensure resolution and exclude underlying malignancy. 2. Nonobstructive bowel gas pattern. 3. Enteric tube in good position. 4.  Aortic Atherosclerosis (ICD10-I70.0). Electronically Signed   By: Iven Finn M.D.    On: 03/08/2022 18:18   DG CHEST PORT 1 VIEW  Result Date: 03/01/2022 CLINICAL DATA:  [id number] Encounter for orogastric (OG) tube placement [id number] EXAM: ABDOMEN - 1 VIEW; PORTABLE CHEST - 1 VIEW COMPARISON:  None Available. FINDINGS: Enteric tube courses below the hemidiaphragm with tip and side port overlying the expected region of the gastric lumen. Enlarged cardiac silhouette with some component likely due to AP portable technique. The heart and mediastinal contours are within normal limits. Aortic calcification. Consolidative airspace opacity of the left upper lobe. No pulmonary edema. No pleural effusion. No pneumothorax. The bowel gas pattern is normal. Right upper quadrant clips noted. Bowel anastomotic staple sutures noted overlying the pelvis. Excretion of intravenous contrast within bilateral renal pelvis sees and urinary bladder lumen. No radio-opaque calculi or other significant radiographic abnormality are seen. No acute osseous abnormality. IMPRESSION: 1. Consolidative airspace opacity of the left upper lobe. Followup PA and lateral chest X-ray is recommended in 3-4 weeks following therapy to ensure resolution and exclude underlying malignancy. 2. Nonobstructive bowel gas pattern. 3. Enteric tube in good position. 4.  Aortic Atherosclerosis (ICD10-I70.0). Electronically Signed   By: Iven Finn M.D.   On: 03/07/2022 18:18   CT HEAD CODE STROKE WO CONTRAST  Addendum Date: 02/24/2022   ADDENDUM REPORT: 02/24/2022 15:28 ADDENDUM: Findings discussed with Dr. Lorrin Goodell via telephone at 3:20 p.m. Electronically Signed   By: Margaretha Sheffield M.D.   On: 02/28/2022 15:28   Result Date: 03/07/2022 CLINICAL DATA:  Code stroke.  Neuro deficit, acute, stroke suspected EXAM: CT HEAD WITHOUT CONTRAST TECHNIQUE: Contiguous axial images were obtained from the base of the skull through the vertex without intravenous contrast. RADIATION DOSE REDUCTION: This exam was performed according to the departmental  dose-optimization program which includes automated exposure control, adjustment of the mA and/or kV according to patient size and/or use of iterative reconstruction technique. COMPARISON:  None Available. FINDINGS: Brain: Hypoattenuation loss of gray differentiation in bilateral frontal lobes and right parietal and occipital lobes, compatible with acute infarcts. Also, small areas of hypoattenuation in bilateral cerebellum, suggestive of acute infarcts. No evidence of acute hemorrhage, mass lesion, midline shift or hydrocephalus. Vascular: No hyperdense vessel identified. Skull: No acute fracture. Sinuses/Orbits: Partially  imaged right maxillary sinus mucosal thickening with air-fluid level. No acute orbital findings. Other: No mastoid effusions. IMPRESSION: 1. Findings concerning for acute infarcts in bilateral frontal lobes, right parietal and occipital lobes and bilateral cerebellum. Given involvement of multiple vascular territories, consider embolic etiology. Also, recommend MRI to further characterize. 2. No acute hemorrhage. Electronically Signed: By: Feliberto Harts M.D. On: 03/04/2022 15:11   CT ANGIO HEAD NECK W WO CM (CODE STROKE)  Result Date: 03/08/2022 CLINICAL DATA:  Neuro deficit, acute, stroke suspected EXAM: CT ANGIOGRAPHY HEAD AND NECK TECHNIQUE: Multidetector CT imaging of the head and neck was performed using the standard protocol during bolus administration of intravenous contrast. Multiplanar CT image reconstructions and MIPs were obtained to evaluate the vascular anatomy. Carotid stenosis measurements (when applicable) are obtained utilizing NASCET criteria, using the distal internal carotid diameter as the denominator. RADIATION DOSE REDUCTION: This exam was performed according to the departmental dose-optimization program which includes automated exposure control, adjustment of the mA and/or kV according to patient size and/or use of iterative reconstruction technique. COMPARISON:   None Available. FINDINGS: CTA NECK FINDINGS Aortic arch: Great vessel origins are patent. Right carotid system: No evidence of dissection, stenosis (50% or greater), or occlusion. Left carotid system: No evidence of dissection, stenosis (50% or greater), or occlusion. Vertebral arteries: Right dominant. No evidence of dissection, stenosis (50% or greater), or occlusion. Skeleton: Multilevel degenerative change.  No acute findings. Other neck: No acute findings. Upper chest: Extensive consolidation in the left upper lobe. Review of the MIP images confirms the above findings CTA HEAD FINDINGS Anterior circulation: Bilateral intracranial ICAs are patent with mild calcific atherosclerosis. Occlusion of the distal right M1 MCA with some opacification of more distal right MCA branches. Left MCA and bilateral ACAs are patent. Posterior circulation: Bilateral intradural vertebral arteries, basilar artery and bilateral posterior cerebral arteries are patent without proximal high-grade stenosis. Venous sinuses: As permitted by contrast timing, patent. Review of the MIP images confirms the above findings IMPRESSION: 1. Distal right M1 MCA occlusion. 2. Extensive consolidation in the left upper lobe, suspicious for pneumonia and/or aspiration. Findings discussed with Dr. Derry Lory via telephone at 3:20 p.m. Electronically Signed   By: Feliberto Harts M.D.   On: 02/27/2022 15:22    Procedures Procedures    Medications Ordered in ED Medications  sodium chloride flush (NS) 0.9 % injection 3 mL (has no administration in time range)   stroke: early stages of recovery book (has no administration in time range)  acetaminophen (TYLENOL) tablet 650 mg (has no administration in time range)    Or  acetaminophen (TYLENOL) 160 MG/5ML solution 650 mg (has no administration in time range)    Or  acetaminophen (TYLENOL) suppository 650 mg (has no administration in time range)  senna-docusate (Senokot-S) tablet 1 tablet (has  no administration in time range)  enoxaparin (LOVENOX) injection 40 mg (has no administration in time range)  ceFAZolin (ANCEF) 2-4 GM/100ML-% IVPB (has no administration in time range)  0.9 %  sodium chloride infusion ( Intravenous Infusion Verify 03/08/2022 1800)  Chlorhexidine Gluconate Cloth 2 % PADS 6 each (has no administration in time range)  Oral care mouth rinse (has no administration in time range)  Oral care mouth rinse (has no administration in time range)  atorvastatin (LIPITOR) tablet 10 mg (has no administration in time range)  LORazepam (ATIVAN) tablet 0.5 mg (has no administration in time range)  amiodarone (NEXTERONE PREMIX) 360-4.14 MG/200ML-% (1.8 mg/mL) IV infusion (has no administration in time  range)  metoprolol tartrate (LOPRESSOR) 5 MG/5ML injection (has no administration in time range)  diltiazem (CARDIZEM) 125 mg in dextrose 5% 125 mL (1 mg/mL) infusion (has no administration in time range)  diltiazem (CARDIZEM) 125 mg in dextrose 5% 125 mL (1 mg/mL) infusion (has no administration in time range)  iohexol (OMNIPAQUE) 350 MG/ML injection 75 mL (75 mLs Intravenous Contrast Given 03/20/2022 1513)  metoprolol tartrate (LOPRESSOR) 5 MG/5ML injection (  Override pull for Anesthesia 03/11/2022 1601)  nitroGLYCERIN 100 mcg/mL intra-arterial injection (25 mcg Intra-arterial Given by Other 03/15/2022 1637)  iohexol (OMNIPAQUE) 300 MG/ML solution 150 mL (100 mLs Intra-arterial Contrast Given 03/20/2022 1712)  clevidipine (CLEVIPREX) 0.5 MG/ML infusion (  Duplicate [id number])    ED Course/ Medical Decision Making/ A&P                           Medical Decision Making Amount and/or Complexity of Data Reviewed Labs: ordered. Radiology: ordered.  Risk Decision regarding hospitalization.     76 year old female presenting to the emergency department with concern for stroke.  The patient was last known normal around 1415 and was found to have left-sided weakness and facial droop  with some repetitive speech.  She had been somewhat confused over the past couple of days but had sudden onset left-sided weakness and right gaze deviation.  She is currently on Eliquis.  Is unclear if she is currently on the medication.  She did arrive to the emergency department as a code stroke and was quickly  taken to the [facility] with neurology bedside for code stroke imaging.  On arrival, the patient was vitally stable, tachycardic.  CBG on arrival 105.  Neuro exam concerning for MCA stroke.  The patient was found to have a right MCA stroke with right MCA distal M1 occlusion.   The patient was not a candidate for TNK as it was unclear when she last took her Eliquis.   CT head: IMPRESSION:  1. Findings concerning for acute infarcts in bilateral frontal  lobes, right parietal and occipital lobes and bilateral cerebellum.  Given involvement of multiple vascular territories, consider embolic  etiology. Also, recommend MRI to further characterize.  2. No acute hemorrhage.    CTA Code Stroke: IMPRESSION:  1. Distal right M1 MCA occlusion.  2. Extensive consolidation in the left upper lobe, suspicious for  pneumonia and/or aspiration.    Findings discussed with Dr. Lorrin Goodell via telephone at 3:20 p.m.  Per neurology, the patient will be a code IR will be going to the IR suite, plan for neurology admission immediately thereafter.   The patient was subsequently transferred to IR in critical condition.    Final Clinical Impression(s) / ED Diagnoses Final diagnoses:  Acute right MCA stroke [facility])    Rx / DC Orders ED Discharge Orders     None         Regan Lemming, MD 03/10/2022 1851

## 2022-03-06 NOTE — Anesthesia Procedure Notes (Addendum)
Arterial Line Insertion Start/End11/14/2023 3:42 PM, 02/23/2022 3:47 PM Performed by: [name], MD, anesthesiologist  Patient location: OOR procedure area. Preanesthetic checklist: patient identified, IV checked, site marked, risks and benefits discussed, surgical consent, monitors and equipment checked, pre-op evaluation, timeout performed and anesthesia consent Emergency situation Patient sedated Left, radial was placed Catheter size: 20 G Hand hygiene performed  and maximum sterile barriers used   Attempts: 1 Procedure performed using ultrasound guided technique. Ultrasound Notes:anatomy identified, needle tip was noted to be adjacent to the nerve/plexus identified, no ultrasound evidence of intravascular and/or intraneural injection and image(s) printed for medical record Following insertion, dressing applied and Biopatch. Post procedure assessment: normal  Post procedure complications: second provider assisted. Patient tolerated the procedure well with no immediate complications.

## 2022-03-06 NOTE — ED Triage Notes (Addendum)
Pt BIBA for concern of stroke. Pt has left sided weakness and facial droop. Repetitive speech. LKW 1415

## 2022-03-06 NOTE — Anesthesia Preprocedure Evaluation (Signed)
Anesthesia Evaluation  Patient identified by MRN, date of birth, ID band Patient confused    Reviewed: Allergy & Precautions, NPO status , Patient's Chart, lab work & pertinent test results, Unable to perform ROS - Chart review onlyPreop documentation limited or incomplete due to emergent nature of procedure.  Airway Mallampati: II  TM Distance: >3 FB Neck ROM: Full    Dental  (+) Dental Advisory Given, Edentulous Upper, Edentulous Lower   Pulmonary neg pulmonary ROS   Pulmonary exam normal breath sounds clear to auscultation       Cardiovascular hypertension, Pt. on home beta blockers and Pt. on medications + CAD and + DVT  + dysrhythmias Supra Ventricular Tachycardia  Rhythm:Regular Rate:Tachycardia  SVT in 160s   Neuro/Psych CVA, Residual Symptoms    GI/Hepatic negative GI ROS, Neg liver ROS,,,  Endo/Other  negative endocrine ROS    Renal/GU negative Renal ROS     Musculoskeletal negative musculoskeletal ROS (+)    Abdominal   Peds  Hematology  (+) Blood dyscrasia (Thrombocytopenia)   Anesthesia Other Findings Day of surgery medications reviewed with the patient.  Reproductive/Obstetrics                             Anesthesia Physical Anesthesia Plan  ASA: 3 and emergent  Anesthesia Plan: General   Post-op Pain Management: Minimal or no pain anticipated   Induction: Intravenous, Rapid sequence and Cricoid pressure planned  PONV Risk Score and Plan: 3  Airway Management Planned: Oral ETT  Additional Equipment: Arterial line  Intra-op Plan:   Post-operative Plan: Post-operative intubation/ventilation  Informed Consent:      Dental advisory given, Only emergency history available and History available from chart only  Plan Discussed with: CRNA  Anesthesia Plan Comments: (Pre-op evaluation completed after induction of anesthesia due to emergent nature of procedure.)        Anesthesia Quick Evaluation

## 2022-03-06 NOTE — Anesthesia Procedure Notes (Signed)
Procedure Name: Intubation Date/Time: 03/04/2022 3:37 PM  Performed by: Trinna Post., CRNAPre-anesthesia Checklist: Patient identified, Emergency Drugs available, Suction available, Patient being monitored and Timeout performed Patient Re-evaluated:Patient Re-evaluated prior to induction Oxygen Delivery Method: Circle system utilized Preoxygenation: Pre-oxygenation with 100% oxygen Induction Type: IV induction and Rapid sequence Laryngoscope Size: Mac and 4 Grade View: Grade I Tube type: Oral Tube size: 7.5 mm Number of attempts: 1 Airway Equipment and Method: Stylet Placement Confirmation: ETT inserted through vocal cords under direct vision, positive ETCO2 and breath sounds checked- equal and bilateral Secured at: 23 cm Tube secured with: Tape Dental Injury: Teeth and Oropharynx as per pre-operative assessment

## 2022-03-06 NOTE — Progress Notes (Addendum)
eLink Physician-Brief Progress Note Patient Name: Sherry Olson DOB: 1946/02/14 MRN: [id number]   Date of Service  03/24/2022  HPI/Events of Note  Patient remains intubated post procedure but needs orders for sedation. Has propofol infusing which RN has already titrated up due to discomfort. In addition, RN pointed out plan for Diltiazem since Cleviprex was causing tachycardia. Cleviprex is still on and RN requested the order be placed back just till the Diltiazem is delivered from pharmacy. EKG done showed ST with RBBB (documented earlier as well).  RN notes initial HR was in 180s and is now 130s. Patient also needs better sedation.   eICU Interventions  Propofol and PRN fentanyl order placed Cleviprex placed as it is still infusing and will be moved over to diltiazem soon  D/w RN     Intervention Category Major Interventions: Arrhythmia - evaluation and management  Oretha Milch 03/19/2022, 7:54 PM  Addendum at 10:45 pm RN notified me that patient never needed diltiazem to be started since HR had come down and was just sinus rhythm anyway. In addition, was started on Phenyephrine and current Sbp is in 130s, HR is 104. RN notes that propofol drops her BP and the fentanyl boluses also drop BP so asked if a low dose of fentanyl drip may be better. Agree, and order placed  Addendum at 11:50 pm  Notified that patient converted to A fib with RVR  Seen in CCM note that this happened earlier as well but had then flipped to sinus rhythm  RN had just started Cardizem at 5 mg/hour HR is 165. Comes down to 110 and then goes back to 160s SBP 118 on 50 mic of Phenylephrine via peripheral line LR bolus also ordered to help with BP  Check a BMP and mag as well  D/w RN   Addendum at 1:45 am Notified that patient had converted back to Hrs in 60s and RN had stopped the Diltiazem. SBP 129. Patient in CT at this time. BMP and mag are back. We will replace per protocol. Will use IV dilt only if A fib  rates rise over 110, per notes she has chronic AF on Eliquis so we will tolerate rates unless rapid.   Addendum at 3:55 am Oliguria with 50 cc urine since 8 pm Most recent creatinine was 0.7 Is on NS at 75 cc/hour Will give a 500 cc bolus over 1 hour

## 2022-03-06 NOTE — Consult Note (Signed)
[name], MRN:  [id number], DOB:  [birth year], LOS: 0 ADMISSION DATE:  03/02/2022, CONSULTATION DATE:  02/26/2022 REFERRING MD:  Dr Lorrin Goodell, CHIEF COMPLAINT:  CVA   History of Present Illness:  Patient admitted with CVA Right MCA M1 segment occlusion, s/p revascularization.  Achieved TICI 2C revascularization  Brought in by ambulance code stroke, left-sided weakness and facial droop Last known well 1345 today-03/01/2022 Noted some confusion over the last few days with some hallucinations  She has a history of hypertension, history of DVT, hyperlipidemia, atrial fibrillation, on Eliquis  Pertinent  Medical History   Past Medical History:  Diagnosis Date   Coronary artery disease    DVT (deep venous thrombosis) (HCC)    HLD (hyperlipidemia)    Hypertension    Significant Hospital Events: Including procedures, antibiotic start and stop dates in addition to other pertinent events   Code stroke CT-IMPRESSION: 1. Findings concerning for acute infarcts in bilateral frontal lobes, right parietal and occipital lobes and bilateral cerebellum. Given involvement of multiple vascular territories, consider embolic etiology. Also, recommend MRI to further characterize. 2. No acute hemorrhage.  Interim History / Subjective:  Post intervention On vent, sedated  Objective   Blood pressure 125/85, pulse (!) 104, resp. rate 16, height 5\' 3"  (1.6 m), weight 51.8 kg, SpO2 (!) 86 %.    Vent Mode: PRVC FiO2 (%):  [60 %-100 %] 60 % Set Rate:  [16 bmp-18 bmp] 16 bmp Vt Set:  [410 mL-500 mL] 410 mL PEEP:  [5 cmH20] 5 cmH20 Plateau Pressure:  [21 cmH20] 21 cmH20   Intake/Output Summary (Last 24 hours) at 03/09/2022 1827 Last data filed at 03/18/2022 1800 Gross per 24 hour  Intake 1026.84 ml  Output 170 ml  Net 856.84 ml   Filed Weights   03/05/2022 1400  Weight: 51.8 kg    Examination: General: Elderly, frail appearing  HENT: Moist oral mucosa Lungs: Clear breath  sounds Cardiovascular: S1-S2 appreciated Abdomen: Soft, bowel sounds appreciated Extremities: No clubbing, no edema Neuro: Sedated GU:   Resolved Hospital Problem list     Assessment & Plan:  Acute ischemic stroke with MCA occlusion on the right post revascularization -Has MRI scheduled for midnight  Atrial fibrillation with fast ventricular response in the 170s and 180s -This may be related to Cleviprex  Received 5 mg of Lopressor x1  History of atrial fibrillation on Eliquis  Will start on Cardizem and titrate to heart rate less than 110 Titrate for blood pressure 120-140  Acute hypoxemic respiratory failure -Maintain ventilator support -Target TVol 6-8cc/kgIBW -Target Plateau Pressure < 30cm H20 -Target driving pressure less than 15 cm of water -Ventilator associated pneumonia prevention protocol  Patient has noted allergy to amiodarone-  EKG reviewed with right bundle branch block Last EKG on record was 2012  Best Practice (right click and "Reselect all SmartList Selections" daily)   Diet/type: NPO DVT prophylaxis: DOAC GI prophylaxis: PPI Lines: N/A Foley:  N/A Code Status:  full code Last date of multidisciplinary goals of care discussion [discussed with spouse at bedside]  Labs   CBC: Recent Labs  Lab 02/28/22 1003 03/13/2022 1500 03/03/2022 1505 03/21/2022 1810  WBC 5.7 11.1*  --   --   NEUTROABS  --  8.1*  --   --   HGB 13.8 14.2 14.6 11.2*  HCT 40.9 42.5 43.0 33.0*  MCV 83.8 85.3  --   --   PLT 194 104*  --   --  Basic Metabolic Panel: Recent Labs  Lab 02/28/22 1003 03/24/2022 1500 02/23/2022 1505 03/03/2022 1810  NA 140 140 140 137  K 4.2 4.0 4.0 3.4*  CL 102 101 102  --   CO2 30 25  --   --   GLUCOSE 101* 108* 104*  --   BUN 11 18 19   --   CREATININE 0.75 0.87 0.80  --   CALCIUM 9.8 9.4  --   --    GFR: Estimated Creatinine Clearance: 48.2 mL/min (by C-G formula based on SCr of 0.8 mg/dL). Recent Labs  Lab 02/28/22 1003  03/13/2022 1500  WBC 5.7 11.1*    Liver Function Tests: Recent Labs  Lab 02/28/22 1003 03/01/2022 1500  AST 27 60*  ALT 17 23  ALKPHOS 68 61  BILITOT 1.0 1.1  PROT 7.1 6.4*  ALBUMIN 4.1 3.5   Recent Labs  Lab 02/28/22 1003  LIPASE 37   No results for input(s): "AMMONIA" in the last 168 hours.  ABG    Component Value Date/Time   PHART 7.419 03/10/2022 1810   PCO2ART 33.9 02/24/2022 1810   PO2ART 273 (H) 03/04/2022 1810   HCO3 22.0 02/28/2022 1810   TCO2 23 02/25/2022 1810   ACIDBASEDEF 2.0 03/15/2022 1810   O2SAT 100 03/23/2022 1810     Coagulation Profile: Recent Labs  Lab 03/24/2022 1500  INR 1.8*    Cardiac Enzymes: No results for input(s): "CKTOTAL", "CKMB", "CKMBINDEX", "TROPONINI" in the last 168 hours.  HbA1C: No results found for: "HGBA1C"  CBG: Recent Labs  Lab 03/12/2022 1501  GLUCAP 105*    Review of Systems:   Unresponsive  Past Medical History:  She,  has a past medical history of Coronary artery disease, DVT (deep venous thrombosis) ([place]), HLD (hyperlipidemia), and Hypertension.   Surgical History:   Past Surgical History:  Procedure Laterality Date   BREAST BIOPSY Right 1996   negative   CHOLECYSTECTOMY     COLON SURGERY  2012   colon resection   COLONOSCOPY N/A 02/26/2021   Procedure: COLONOSCOPY;  Surgeon: Annamaria Helling, DO;  Location: [facility] ENDOSCOPY;  Service: Gastroenterology;  Laterality: N/A;     Social History:   reports that she has never smoked. She has never used smokeless tobacco. She reports that she does not currently use alcohol. She reports that she does not use drugs.   Family History:  Her family history includes Deep vein thrombosis in her father; Heart attack in her father; Hypertension in her father. There is no history of Breast cancer.   Allergies Allergies  Allergen Reactions   Amiodarone    Welchol [Colesevelam Hcl] Diarrhea and Nausea And Vomiting     Home Medications  Prior to Admission  medications   Medication Sig Start Date End Date Taking? Authorizing Provider  amLODipine (NORVASC) 5 MG tablet Take 5 mg by mouth daily.    [provider]  amoxicillin-clavulanate (AUGMENTIN) 875-125 MG tablet Take 1 tablet by mouth 2 (two) times daily for 7 days. 02/28/22 03/07/22  Nathaniel Man, MD  aspirin 81 MG chewable tablet Chew by mouth daily.    [provider]  atorvastatin (LIPITOR) 10 MG tablet Take 10 mg by mouth daily.    [provider]  benazepril (LOTENSIN) 20 MG tablet Take 20 mg by mouth daily.    [provider]  Calcium Carbonate (CALCIUM 600 PO) Take by mouth daily.    [provider]  cholecalciferol (VITAMIN D) 1000 units tablet Take 1,000 Units  by mouth daily.    [provider]  CITALOPRAM HYDROBROMIDE PO Take by mouth.    [provider]  dicyclomine (BENTYL) 10 MG capsule Take 10 mg by mouth 4 (four) times daily -  before meals and at bedtime. Patient not taking: Reported on 02/26/2021    [provider]  doxycycline (ADOXA) 100 MG tablet Take 1 tablet (100 mg total) by mouth 2 (two) times daily for 7 days. 02/28/22 03/07/22  Corena Herter, MD  FLUTICASONE PROPIONATE EX Apply topically. Patient not taking: Reported on 02/26/2021    [provider]  furosemide (LASIX) 20 MG tablet Take 20 mg by mouth daily. Patient not taking: Reported on 02/26/2021    [provider]  LORazepam (ATIVAN) 0.5 MG tablet Take 0.5 mg by mouth as needed for anxiety.    [provider]  METOPROLOL SUCCINATE PO Take by mouth.    [provider]  omeprazole (PRILOSEC) 40 MG capsule Take 40 mg by mouth daily.    [provider]  ondansetron (ZOFRAN-ODT) 4 MG disintegrating tablet Take 1 tablet (4 mg total) by mouth every 8 (eight) hours as needed for nausea or vomiting. 02/28/22   Corena Herter, MD    The patient is critically ill with multiple organ systems failure and requires  high complexity decision making for assessment and support, frequent evaluation and titration of therapies, application of advanced monitoring technologies and extensive interpretation of multiple databases. Critical Care Time devoted to patient care services described in this note independent of APP/resident time (if applicable)  is 35 minutes.   Virl Diamond MD [facility] Personal pager: See Amion If unanswered, please page CCM On-call: #[phone]

## 2022-03-06 NOTE — H&P (Signed)
NEUROLOGY CONSULTATION NOTE   Date of service: March 06, 2022 Patient Name: Sherry Olson MRN:  [id number] DOB:  July 13, 1945  _ _ _   _ __   _ __ _ _  __ __   _ __   __ _  History of Present Illness  Sherry Olson is a 76 y.o. female with PMH significant for DVT, HTN, HLD, Afibb on Eliquis who presents with sudden onset left-sided weakness, right gaze deviation with a last known well of 1345 on 03/22/2022.  Patient's husband reports that she has been somewhat confused over the last couple days and having some hallucinations and seeing things and talking.  Today, she had sudden onset left sided weakness and R gaze deviation. Reconciliation of outside medications shows she has been on eliquis and had a month of eliquis dispensed, this however is not listed in epic chart.  I spoke with patient's husband and daughter and is unclear if she took any Eliquis in the last 48 hours.  LKW: 1345 on 02/24/2022. mRS: 0 tNKASE: Not offered as patient is on Eliquis per review of records and on reviewing dispense report, appears that she was dispensed 1 month of Eliquis on 02/04/2022.  Family is not sure if she took any Eliquis in the last 48 hours as patient administers her medications herself.  She was also noted to have acute appearing infarct in the left frontal and right PCA territory in addition to the noted right MCA distal M1 occlusion.  And given that the strokes could possibly have a subacute appearance, she was not offered TNKase. Thrombectomy: Her presentation was discussed with her husband and daughter including the noted strokes in the left frontal, right PCA and the noted right MCA distal M1 occlusion with no hypodensity noted in the right MCA distribution. Dr. Corliss Skains discussed with husband and daughter details of thrombectomy along with risks and benefits and alternatives.  The daughter and husband were in agreement and consented to thrombectomy.  They did not have any additional questions at  this time. NIHSS components Score: Comment  1a Level of Conscious 0[x]  1[]  2[]  3[]      1b LOC Questions 0[]  1[]  2[x]       1c LOC Commands 0[]  1[]  2[x]       2 Best Gaze 0[]  1[x]  2[]       3 Visual 0[]  1[]  2[x]  3[]      4 Facial Palsy 0[]  1[x]  2[]  3[]      5a Motor Arm - left 0[]  1[]  2[x]  3[]  4[]  UN[]    5b Motor Arm - Right 0[x]  1[]  2[]  3[]  4[]  UN[]    6a Motor Leg - Left 0[]  1[x]  2[]  3[]  4[]  UN[]    6b Motor Leg - Right 0[]  1[x]  2[]  3[]  4[]  UN[]    7 Limb Ataxia 0[x]  1[]  2[]  3[]  UN[]     8 Sensory 0[x]  1[]  2[]  UN[]      9 Best Language 0[]  1[x]  2[]  3[]      10 Dysarthria 0[x]  1[]  2[]  UN[]      11 Extinct. and Inattention 0[]  1[]  2[x]       TOTAL: 15       ROS   Unable to get review of system due to the acuity of the situation and the fact the patient was encephalopathic.  Past History   Past Medical History:  Diagnosis Date   Coronary artery disease    DVT (deep venous thrombosis) (HCC)    HLD (hyperlipidemia)    Hypertension    Past Surgical History:  Procedure Laterality Date   BREAST BIOPSY Right 1996   negative   CHOLECYSTECTOMY     COLON SURGERY  2012   colon resection   COLONOSCOPY N/A 02/26/2021   Procedure: COLONOSCOPY;  Surgeon: Annamaria Helling, DO;  Location: [facility] ENDOSCOPY;  Service: Gastroenterology;  Laterality: N/A;   Family History  Problem Relation Age of Onset   Hypertension Father    Deep vein thrombosis Father    Heart attack Father    Breast cancer Neg Hx    Social History   Socioeconomic History   Marital status: Married    Spouse name: Not on file   Number of children: Not on file   Years of education: Not on file   Highest education level: Not on file  Occupational History   Not on file  Tobacco Use   Smoking status: Never   Smokeless tobacco: Never  Vaping Use   Vaping Use: Never used  Substance and Sexual Activity   Alcohol use: Not Currently   Drug use: Never   Sexual activity: Not Currently    Birth control/protection:  Post-menopausal  Other Topics Concern   Not on file  Social History Narrative   Not on file   Social Determinants of Health   Financial Resource Strain: Not on file  Food Insecurity: Not on file  Transportation Needs: Not on file  Physical Activity: Not on file  Stress: Not on file  Social Connections: Not on file   Allergies  Allergen Reactions   Amiodarone    Welchol [Colesevelam Hcl] Diarrhea and Nausea And Vomiting    Medications  (Not in a hospital admission)    Vitals   Vitals:   02/26/2022 1400 02/23/2022 1519  BP:  (!) 171/101  Pulse:  (!) 155  Resp:  18  SpO2:  97%  Weight: 51.8 kg      Body mass index is 20.23 kg/m.  Physical Exam   General: Laying comfortably in bed; in no acute distress.  HENT: Normal oropharynx and mucosa. Normal external appearance of ears and nose.  Neck: Supple, no pain or tenderness  CV: No JVD. No peripheral edema.  Pulmonary: Symmetric Chest rise. Normal respiratory effort.  Abdomen: Soft to touch, non-tender.  Ext: No cyanosis, edema, or deformity  Skin: No rash. Normal palpation of skin.   Musculoskeletal: Normal digits and nails by inspection. No clubbing.   Neurologic Examination  Mental status/Cognition: Alert, makes eye contact on the right.  Does not follow commands or answer any questions.   Speech/language: Repeats number from 1-10 but does not follow any commands or answer any questions or name any objects. cranial nerves:   CN II Pupils equal and reactive to light, left hemianopsia.   CN III,IV,VI Right gaze deviation, does seem to cross midline.   CN V normal sensation in V1, V2, and V3 segments bilaterally   CN VII L facial droop   CN VIII normal hearing to speech   CN IX & X normal palatal elevation, no uvular deviation   CN XI Head turned to the right   CN XII midline tongue but does not protrude on command.   Motor:  Muscle bulk: poor, tone flaccid in LUE BL lower extremities falls to the bed within a  couple secs when held up LUE: flaccid paralysis with some but minimal movement RUE: hold off the bed   Sensation:  Light touch Withdraws to pinch in all extremities.   Pin prick    Temperature  Vibration   Proprioception    Coordination/Complex Motor:  - unable to assess. - Gait: deferred for patient safet.y  Labs   CBC:  Recent Labs  Lab 02/28/22 1003 03/02/2022 1500 03/13/2022 1505  WBC 5.7 11.1*  --   NEUTROABS  --  8.1*  --   HGB 13.8 14.2 14.6  HCT 40.9 42.5 43.0  MCV 83.8 85.3  --   PLT 194 104*  --     Basic Metabolic Panel:  Lab Results  Component Value Date   NA 140 02/24/2022   K 4.0 03/11/2022   CO2 30 02/28/2022   GLUCOSE 104 (H) 03/11/2022   BUN 19 03/17/2022   CREATININE 0.80 03/05/2022   CALCIUM 9.8 02/28/2022   GFRNONAA >60 02/28/2022   Lipid Panel: No results found for: "LDLCALC" HgbA1c: No results found for: "HGBA1C" Urine Drug Screen: No results found for: "LABOPIA", "COCAINSCRNUR", "LABBENZ", "AMPHETMU", "THCU", "LABBARB"  Alcohol Level No results found for: "ETH"  CT Head without contrast(Personally reviewed): 1. Findings concerning for acute infarcts in bilateral frontal lobes, right parietal and occipital lobes and bilateral cerebellum. Given involvement of multiple vascular territories, consider embolic etiology. Also, recommend MRI to further characterize.  CT angio Head and Neck with contrast(Personally reviewed): R MCA distal M1 occlusion  MRI Brain(Personally reviewed): Pending  Impression   Sherry Olson is a 76 y.o. female with PMH significant for DVT, HTN, HLD, Afibb on Eliquis who presents with sudden onset left-sided weakness, right gaze deviation with a last known well of 1345 on 02/24/2022. Found to have embolic appearing strokes with R MCA M1 distal occlusion. No other LVO. Territory in the R MCA appears to be preserved and she was thus taken for thrombectomy. Not a candidate for tnkase given eliquis and unclear if she  wa taking it and the noted stroke in L frontal and R PCA which do appear completed.  Recommendations  R MCA stroke with R MCA distal M1 occlusion: - Frequent Neuro checks per stroke unit protocol - Recommend brain imaging with MRI Brain without contrast - Recommend obtaining TTE - Recommend obtaining Lipid panel with LDL - Please start statin if LDL > 70 - Recommend HbA1c - Antithrombotic - per neuro IR for the first 24 hours after intervention - Recommend DVT ppx - SBP goal - per neuro IR for the first 24 hours after intervention. - Recommend Telemetry monitoring for arrythmia - Recommend bedside swallow screen prior to PO intake. - Stroke education booklet - Recommend PT/OT/SLP consult  Acute hypoxic resp failure secondary to above: - on Vent - PCCM team consulted for assistance with Vent management.   Hyperlipidemia: -Continue home atorvastatin daily.  GERD: - Continue home omeprazole.  Hypertension: Hold home medications for now.  Will use as needed hydralazine will cover proximal as needed for blood pressure control.  Goal SBP per neuro rads for the first 24 hours.  This patient is critically ill and at significant risk of neurological worsening, death and care requires constant monitoring of vital signs, hemodynamics,respiratory and cardiac monitoring, neurological assessment, discussion with family, other specialists and medical decision making of high complexity. I spent 80 minutes of neurocritical care time  in the care of  this patient. This was time spent independent of any time provided by nurse practitioner or PA.  Erick Blinks [facility] Pager Number [phone] 03/19/2022  6:29 PM   ______________________________________________________________________     Thank you for the opportunity to take part in the care of this patient. If  you have any further questions, please contact the neurology consultation attending.  Signed,  [place] Pager Number [phone] _ _ _   _ __   _ __ _ _  __ __   _ __   __ _

## 2022-03-06 NOTE — Progress Notes (Signed)
  Amiodarone Drug - Drug Interaction Consult Note  Recommendations: Monitor for s/sx myalgias on atorvastatin Monitor for s/sx bleeding if restarted on apixaban  Monitor for bradycardia with diltiazem and if restarted on sotalol, metoprolol and amlodipine Monitor QTC if restarted on citalopram and sotalol   Amiodarone is metabolized by the cytochrome P450 system and therefore has the potential to cause many drug interactions. Amiodarone has an average plasma half-life of 50 days (range 20 to 100 days).   There is potential for drug interactions to occur several weeks or months after stopping treatment and the onset of drug interactions may be slow after initiating amiodarone.   [x]  Statins: Increased risk of myopathy. Simvastatin- restrict dose to 20mg  daily. Other statins: counsel patients to report any muscle pain or weakness immediately.  [x]  Anticoagulants: Amiodarone can increase anticoagulant effect. Consider warfarin dose reduction. Patients should be monitored closely and the dose of anticoagulant altered accordingly, remembering that amiodarone levels take several weeks to stabilize.  []  Antiepileptics: Amiodarone can increase plasma concentration of phenytoin, the dose should be reduced. Note that small changes in phenytoin dose can result in large changes in levels. Monitor patient and counsel on signs of toxicity.  [x]  Beta blockers: increased risk of bradycardia, AV block and myocardial depression. Sotalol - avoid concomitant use.  [x]   Calcium channel blockers (diltiazem and verapamil): increased risk of bradycardia, AV block and myocardial depression.  []   Cyclosporine: Amiodarone increases levels of cyclosporine. Reduced dose of cyclosporine is recommended.  []  Digoxin dose should be halved when amiodarone is started.  []  Diuretics: increased risk of cardiotoxicity if hypokalemia occurs.  []  Oral hypoglycemic agents (glyburide, glipizide, glimepiride): increased risk of  hypoglycemia. Patient's glucose levels should be monitored closely when initiating amiodarone therapy.   [x]  Drugs that prolong the QT interval:  Torsades de pointes risk may be increased with concurrent use - avoid if possible.  Monitor QTc, also keep magnesium/potassium WNL if concurrent therapy can't be avoided.  Antibiotics: e.g. fluoroquinolones, erythromycin.  Antiarrhythmics: e.g. quinidine, procainamide, disopyramide, sotalol.  Antipsychotics: e.g. phenothiazines, haloperidol.   Lithium, tricyclic antidepressants, and methadone.  Thank You,   , PharmD, BCPS, [facility] Clinical Pharmacist  Please check AMION for all [facility] phone numbers After 10:00 PM, call Main Pharmacy [phone]

## 2022-03-06 NOTE — Progress Notes (Signed)
Orthopedic Tech Progress Note Patient Details:  Sherry Olson 12/21/1945 [id number]  Ortho Devices Type of Ortho Device: Knee Immobilizer Ortho Device/Splint Location: rle Ortho Device/Splint Interventions: Ordered, Application, Adjustment   Post Interventions Patient Tolerated: Well Instructions Provided: Care of device, Adjustment of device  Trinna Post 03/17/2022, 8:47 PM

## 2022-03-06 NOTE — Progress Notes (Addendum)
Patient transferred from IR to 4NICU approximately 1730.  Sedation pause for neuro exam.  Neuro MD notified.    Family at bedside.   1830:  Patient's hear rate elevated to 170s/180s.  CCM called to bedside. 12 lead EKG completed.   New orders received.

## 2022-03-06 NOTE — Anesthesia Postprocedure Evaluation (Signed)
Anesthesia Post Note  Patient: Sherry Olson  Procedure(s) Performed: IR WITH ANESTHESIA     Patient location during evaluation: SICU Anesthesia Type: General Level of consciousness: sedated Pain management: pain level controlled Vital Signs Assessment: post-procedure vital signs reviewed and stable Respiratory status: patient remains intubated per anesthesia plan Cardiovascular status: stable and tachycardic Postop Assessment: no apparent nausea or vomiting Anesthetic complications: no   No notable events documented.  Last Vitals:  Vitals:   04-01-22 1900 2022/04/01 2000  BP: (!) 130/106   Pulse: (!) 123   Resp: (!) 22   Temp:  (!) 36.2 C  SpO2: 100%     Last Pain:  Vitals:   Apr 01, 2022 2000  TempSrc: Axillary  PainSc:                  Sherry Olson

## 2022-03-06 NOTE — Procedures (Signed)
INR  Status post right common carotid arteriogram and bilateral vertebral artery angiograms.   Right CFA approach. Findings.   Occluded right  MCA M1 segment. Status post revascularization of occluded right middle cerebral artery distal M1 segment with 2 passes of contact aspiration, and 1 pass with a 3 mm x 20 mm solitaire X retrieval device and contact aspiration achieving aTICI 2C revascularization. Post CT of the brain demonstrates mild to moderate right perisylvian subarachnoid hyperattenuation, and a mild right temporal parietal hyperattenuation.  No mass effect noted. 8 French Angio-Seal closure device deployed for hemostasis at the right groin puncture site.  Distal pulses all intact and unchanged from prior to the procedure. Patient left intubated due to medical accommodation to prevent aspiration. Fatima Sanger MD.

## 2022-03-07 ENCOUNTER — Inpatient Hospital Stay (HOSPITAL_COMMUNITY): Payer: Medicare Other

## 2022-03-07 ENCOUNTER — Encounter (HOSPITAL_COMMUNITY): Payer: Self-pay | Admitting: Radiology

## 2022-03-07 ENCOUNTER — Other Ambulatory Visit (HOSPITAL_COMMUNITY): Payer: Medicare Other

## 2022-03-07 DIAGNOSIS — I63511 Cerebral infarction due to unspecified occlusion or stenosis of right middle cerebral artery: Secondary | ICD-10-CM | POA: Diagnosis not present

## 2022-03-07 DIAGNOSIS — J9622 Acute and chronic respiratory failure with hypercapnia: Secondary | ICD-10-CM | POA: Diagnosis not present

## 2022-03-07 DIAGNOSIS — I480 Paroxysmal atrial fibrillation: Secondary | ICD-10-CM | POA: Diagnosis not present

## 2022-03-07 DIAGNOSIS — J9621 Acute and chronic respiratory failure with hypoxia: Secondary | ICD-10-CM | POA: Diagnosis not present

## 2022-03-07 DIAGNOSIS — I6601 Occlusion and stenosis of right middle cerebral artery: Secondary | ICD-10-CM

## 2022-03-07 LAB — LIPID PANEL
Cholesterol: 114 mg/dL (ref 0–200)
HDL: 32 mg/dL — ABNORMAL LOW (ref >40)
LDL Cholesterol: 57 mg/dL (ref 0–99)
Total CHOL/HDL Ratio: 3.6 RATIO
Triglycerides: 125 mg/dL (ref <150)
VLDL: 25 mg/dL (ref 0–40)

## 2022-03-07 LAB — COMPREHENSIVE METABOLIC PANEL
ALT: 19 U/L (ref 0–44)
AST: 45 U/L — ABNORMAL HIGH (ref 15–41)
Albumin: 2.5 g/dL — ABNORMAL LOW (ref 3.5–5.0)
Alkaline Phosphatase: 44 U/L (ref 38–126)
Anion gap: 11 (ref 5–15)
BUN: 14 mg/dL (ref 8–23)
CO2: 20 mmol/L — ABNORMAL LOW (ref 22–32)
Calcium: 7.8 mg/dL — ABNORMAL LOW (ref 8.9–10.3)
Chloride: 110 mmol/L (ref 98–111)
Creatinine, Ser: 0.72 mg/dL (ref 0.44–1.00)
GFR, Estimated: >60 mL/min (ref >60)
Glucose, Bld: 107 mg/dL — ABNORMAL HIGH (ref 70–99)
Potassium: 4.3 mmol/L (ref 3.5–5.1)
Sodium: 141 mmol/L (ref 135–145)
Total Bilirubin: 0.5 mg/dL (ref 0.3–1.2)
Total Protein: 4.4 g/dL — ABNORMAL LOW (ref 6.5–8.1)

## 2022-03-07 LAB — HEMOGLOBIN A1C
Hgb A1c MFr Bld: 5 % (ref 4.8–5.6)
Mean Plasma Glucose: 96.8 mg/dL

## 2022-03-07 LAB — TRIGLYCERIDES: Triglycerides: 107 mg/dL (ref <150)

## 2022-03-07 LAB — BASIC METABOLIC PANEL
Anion gap: 10 (ref 5–15)
BUN: 15 mg/dL (ref 8–23)
CO2: 22 mmol/L (ref 22–32)
Calcium: 8 mg/dL — ABNORMAL LOW (ref 8.9–10.3)
Chloride: 110 mmol/L (ref 98–111)
Creatinine, Ser: 0.71 mg/dL (ref 0.44–1.00)
GFR, Estimated: >60 mL/min (ref >60)
Glucose, Bld: 115 mg/dL — ABNORMAL HIGH (ref 70–99)
Potassium: 3.5 mmol/L (ref 3.5–5.1)
Sodium: 142 mmol/L (ref 135–145)

## 2022-03-07 LAB — ECHOCARDIOGRAM COMPLETE
AR max vel: 2.53 cm2
AV Area VTI: 2.33 cm2
AV Area mean vel: 2.18 cm2
AV Mean grad: 4.7 mmHg
AV Peak grad: 8.9 mmHg
Ao pk vel: 1.49 m/s
Area-P 1/2: 4.4 cm2
Height: 63 in
MV M vel: 5.08 m/s
MV Peak grad: 103 mmHg
P 1/2 time: 466 msec
S' Lateral: 3.2 cm
Weight: 1827.17 oz

## 2022-03-07 LAB — MAGNESIUM: Magnesium: 1.7 mg/dL (ref 1.7–2.4)

## 2022-03-07 MED ORDER — DILTIAZEM LOAD VIA INFUSION
10.0000 mg | Freq: Once | INTRAVENOUS | Status: AC
  Administered 2022-03-07: 10 mg via INTRAVENOUS
  Filled 2022-03-07: qty 10

## 2022-03-07 MED ORDER — SODIUM CHLORIDE 0.9 % IV SOLN
500.0000 mg | INTRAVENOUS | Status: AC
  Administered 2022-03-07 – 2022-03-11 (×5): 500 mg via INTRAVENOUS
  Filled 2022-03-07 (×5): qty 5

## 2022-03-07 MED ORDER — POTASSIUM CHLORIDE 20 MEQ PO PACK
40.0000 meq | PACK | Freq: Once | ORAL | Status: AC
  Administered 2022-03-07: 40 meq
  Filled 2022-03-07: qty 2

## 2022-03-07 MED ORDER — HEPARIN SODIUM (PORCINE) 5000 UNIT/ML IJ SOLN
5000.0000 [IU] | Freq: Three times a day (TID) | INTRAMUSCULAR | Status: DC
  Administered 2022-03-07 – 2022-03-09 (×7): 5000 [IU] via SUBCUTANEOUS
  Filled 2022-03-07 (×7): qty 1

## 2022-03-07 MED ORDER — SODIUM CHLORIDE 0.9 % IV SOLN
2.0000 g | INTRAVENOUS | Status: AC
  Administered 2022-03-07 – 2022-03-11 (×5): 2 g via INTRAVENOUS
  Filled 2022-03-07 (×5): qty 20

## 2022-03-07 MED ORDER — SODIUM CHLORIDE 0.9 % IV BOLUS
500.0000 mL | Freq: Once | INTRAVENOUS | Status: AC
  Administered 2022-03-07: 500 mL via INTRAVENOUS

## 2022-03-07 MED ORDER — ASPIRIN 325 MG PO TABS
325.0000 mg | ORAL_TABLET | Freq: Every day | ORAL | Status: DC
  Administered 2022-03-08 – 2022-03-09 (×2): 325 mg
  Filled 2022-03-07 (×2): qty 1

## 2022-03-07 MED ORDER — MAGNESIUM SULFATE 2 GM/50ML IV SOLN
2.0000 g | Freq: Once | INTRAVENOUS | Status: AC
  Administered 2022-03-07: 2 g via INTRAVENOUS
  Filled 2022-03-07: qty 50

## 2022-03-07 MED ORDER — DILTIAZEM HCL-DEXTROSE 125-5 MG/125ML-% IV SOLN (PREMIX)
5.0000 mg/h | INTRAVENOUS | Status: DC
  Administered 2022-03-07: 5 mg/h via INTRAVENOUS
  Administered 2022-03-08: 20 mg/h via INTRAVENOUS
  Filled 2022-03-07 (×3): qty 125

## 2022-03-07 NOTE — Progress Notes (Addendum)
STROKE TEAM PROGRESS NOTE   INTERVAL HISTORY No family at the bedside.  Remains intubated, CCM on board, MRI scheduled for 1200 and plan to wean after that.   Vitals:   03/07/22 1030 03/07/22 1122 03/07/22 1130 03/07/22 1200  BP: (!) 118/52 (!) 123/50 (!) 128/50 (!) 129/52  Pulse: 67 69 71 66  Resp: 16 16 14 16   Temp:    98.7 F (37.1 C)  TempSrc:    Axillary  SpO2: 100% 100% 100% 100%  Weight:      Height:       CBC:  Recent Labs  Lab 02/26/2022 1500 03/13/2022 1505 03/17/2022 1810  WBC 11.1*  --   --   NEUTROABS 8.1*  --   --   HGB 14.2 14.6 11.2*  HCT 42.5 43.0 33.0*  MCV 85.3  --   --   PLT 104*  --   --    Basic Metabolic Panel:  Recent Labs  Lab 03/07/22 0015 03/07/22 0547  NA 142 141  K 3.5 4.3  CL 110 110  CO2 22 20*  GLUCOSE 115* 107*  BUN 15 14  CREATININE 0.71 0.72  CALCIUM 8.0* 7.8*  MG 1.7  --    Lipid Panel:  Recent Labs  Lab 03/07/22 0547  CHOL 114  TRIG 125  HDL 32*  CHOLHDL 3.6  VLDL 25  LDLCALC 57   HgbA1c: No results for input(s): "HGBA1C" in the last 168 hours. Urine Drug Screen: No results for input(s): "LABOPIA", "COCAINSCRNUR", "LABBENZ", "AMPHETMU", "THCU", "LABBARB" in the last 168 hours.  Alcohol Level  Recent Labs  Lab 03/03/2022 1500  ETH <10    IMAGING past 24 hours MR ANGIO HEAD WO CONTRAST  Result Date: 03/07/2022 CLINICAL DATA:  Stroke, follow-up. EXAM: MRI HEAD WITHOUT CONTRAST MRA HEAD WITHOUT CONTRAST TECHNIQUE: Multiplanar, multi-echo pulse sequences of the brain and surrounding structures were acquired without intravenous contrast. Angiographic images of the Circle of Willis were acquired using MRA technique without intravenous contrast. COMPARISON:  Head CT March 07, 2022. FINDINGS: MRI HEAD FINDINGS Brain: Scattered and confluent areas of restricted diffusion are seen in the bilateral cerebral and cerebellar hemispheres with the largest confluent areas involving the right temporoparietal occipital region and  bilateral frontal lobes. Susceptibility artifact is noted in the sulci of the right is temporoparietal occipital region, bilateral occipital horns and along the tentorium, with distribution similar to prior CT, likely representing subarachnoid hemorrhage. No significant mass effect, intraparenchymal hemorrhage, hydrocephalus or mass lesion. Vascular: Normal flow voids. Skull and upper cervical spine: Normal marrow signal. Sinuses/Orbits: Mucosal thickening and bubbly secretion within the right maxillary sinus. Right mastoid effusion. The orbits are maintained. Other: None. MRA HEAD FINDINGS Anterior circulation: The visualized portions of the distal cervical and intracranial internal carotid arteries are widely patent with normal flow related enhancement. The bilateral anterior cerebral arteries and middle cerebral arteries are widely patent with anterograde flow without high-grade flow-limiting stenosis or proximal branch occlusion. No intracranial aneurysm within the anterior circulation. Posterior circulation: The vertebral arteries are widely patent with anterograde flow. Vertebrobasilar junction and basilar artery are widely patent with anterograde flow without evidence of basilar stenosis or aneurysm. Posterior cerebral arteries are normal bilaterally. No intracranial aneurysm within the posterior circulation. Anatomic variants: None significant. IMPRESSION: 1. Scattered and confluent areas of restricted diffusion in the bilateral cerebral and cerebellar hemispheres, consistent with acute infarcts, likely embolic. 2. Small amount of subarachnoid hemorrhage in the right temporoparietal occipital region, bilateral occipital horns and along  the tentorium. 3. No evidence of high-grade flow-limiting stenosis or proximal branch occlusion in the intracranial circulation. Electronically Signed   By: Pedro Earls M.D.   On: 03/07/2022 13:22   MR BRAIN WO CONTRAST  Result Date: 03/07/2022 CLINICAL  DATA:  Stroke, follow-up. EXAM: MRI HEAD WITHOUT CONTRAST MRA HEAD WITHOUT CONTRAST TECHNIQUE: Multiplanar, multi-echo pulse sequences of the brain and surrounding structures were acquired without intravenous contrast. Angiographic images of the Circle of Willis were acquired using MRA technique without intravenous contrast. COMPARISON:  Head CT March 07, 2022. FINDINGS: MRI HEAD FINDINGS Brain: Scattered and confluent areas of restricted diffusion are seen in the bilateral cerebral and cerebellar hemispheres with the largest confluent areas involving the right temporoparietal occipital region and bilateral frontal lobes. Susceptibility artifact is noted in the sulci of the right is temporoparietal occipital region, bilateral occipital horns and along the tentorium, with distribution similar to prior CT, likely representing subarachnoid hemorrhage. No significant mass effect, intraparenchymal hemorrhage, hydrocephalus or mass lesion. Vascular: Normal flow voids. Skull and upper cervical spine: Normal marrow signal. Sinuses/Orbits: Mucosal thickening and bubbly secretion within the right maxillary sinus. Right mastoid effusion. The orbits are maintained. Other: None. MRA HEAD FINDINGS Anterior circulation: The visualized portions of the distal cervical and intracranial internal carotid arteries are widely patent with normal flow related enhancement. The bilateral anterior cerebral arteries and middle cerebral arteries are widely patent with anterograde flow without high-grade flow-limiting stenosis or proximal branch occlusion. No intracranial aneurysm within the anterior circulation. Posterior circulation: The vertebral arteries are widely patent with anterograde flow. Vertebrobasilar junction and basilar artery are widely patent with anterograde flow without evidence of basilar stenosis or aneurysm. Posterior cerebral arteries are normal bilaterally. No intracranial aneurysm within the posterior circulation.  Anatomic variants: None significant. IMPRESSION: 1. Scattered and confluent areas of restricted diffusion in the bilateral cerebral and cerebellar hemispheres, consistent with acute infarcts, likely embolic. 2. Small amount of subarachnoid hemorrhage in the right temporoparietal occipital region, bilateral occipital horns and along the tentorium. 3. No evidence of high-grade flow-limiting stenosis or proximal branch occlusion in the intracranial circulation. Electronically Signed   By: Pedro Earls M.D.   On: 03/07/2022 13:22   ECHOCARDIOGRAM COMPLETE  Result Date: 03/07/2022    ECHOCARDIOGRAM REPORT   Patient Name:   Sherry Olson [facility] Date of Exam: 03/07/2022 Medical Rec #:  [id number]       Height:       63.0 in Accession #:    [id number]      Weight:       114.2 lb Date of Birth:  08-21-1945        BSA:          1.524 m Patient Age:    23 years        BP:           121/48 mmHg Patient Gender: F               HR:           69 bpm. Exam Location:  Inpatient Procedure: 2D Echo, Cardiac Doppler and Color Doppler Indications:    Stroke I63.9  History:        Patient has no prior history of Echocardiogram examinations.                 CAD, Stroke; Risk Factors:Hypertension and Dyslipidemia.  Sonographer:    Ronny Flurry Referring Phys: [id number] [place]  1. Left ventricular  ejection fraction, by estimation, is 60 to 65%. The left ventricle has normal function. The left ventricle has no regional wall motion abnormalities. Left ventricular diastolic parameters were normal.  2. Right ventricular systolic function is normal. The right ventricular size is normal.  3. Moderate but not quite circumferential effusion with dilated IVC Mild diastolic RV collapse on 2D but no variation in mitral inflow Suggest clinical correltation and repeat TTE in 2 weeks . Moderate pericardial effusion. The pericardial effusion is posterior to the left ventricle, lateral to the left ventricle and  anterior to the right ventricle.  4. The mitral valve is abnormal. Mild mitral valve regurgitation. No evidence of mitral stenosis.  5. The aortic valve is tricuspid. There is mild calcification of the aortic valve. There is mild thickening of the aortic valve. Aortic valve regurgitation is mild to moderate. Aortic valve sclerosis is present, with no evidence of aortic valve stenosis.  6. The inferior vena cava is dilated in size with >50% respiratory variability, suggesting right atrial pressure of 8 mmHg. FINDINGS  Left Ventricle: Left ventricular ejection fraction, by estimation, is 60 to 65%. The left ventricle has normal function. The left ventricle has no regional wall motion abnormalities. The left ventricular internal cavity size was normal in size. There is  no left ventricular hypertrophy. Left ventricular diastolic parameters were normal. Right Ventricle: The right ventricular size is normal. No increase in right ventricular wall thickness. Right ventricular systolic function is normal. Left Atrium: Left atrial size was normal in size. Right Atrium: Right atrial size was normal in size. Pericardium: Moderate but not quite circumferential effusion with dilated IVC Mild diastolic RV collapse on 2D but no variation in mitral inflow Suggest clinical correltation and repeat TTE in 2 weeks. A moderately sized pericardial effusion is present. The pericardial effusion is posterior to the left ventricle, lateral to the left ventricle and anterior to the right ventricle. Mitral Valve: The mitral valve is abnormal. There is mild thickening of the mitral valve leaflet(s). There is mild calcification of the mitral valve leaflet(s). Mild mitral valve regurgitation. No evidence of mitral valve stenosis. Tricuspid Valve: The tricuspid valve is normal in structure. Tricuspid valve regurgitation is trivial. No evidence of tricuspid stenosis. Aortic Valve: The aortic valve is tricuspid. There is mild calcification of the  aortic valve. There is mild thickening of the aortic valve. Aortic valve regurgitation is mild to moderate. Aortic regurgitation PHT measures 466 msec. Aortic valve sclerosis  is present, with no evidence of aortic valve stenosis. Aortic valve mean gradient measures 4.7 mmHg. Aortic valve peak gradient measures 8.9 mmHg. Aortic valve area, by VTI measures 2.33 cm. Pulmonic Valve: The pulmonic valve was normal in structure. Pulmonic valve regurgitation is not visualized. No evidence of pulmonic stenosis. Aorta: The aortic root is normal in size and structure. Venous: The inferior vena cava is dilated in size with greater than 50% respiratory variability, suggesting right atrial pressure of 8 mmHg. IAS/Shunts: No atrial level shunt detected by color flow Doppler.  LEFT VENTRICLE PLAX 2D LVIDd:         4.70 cm   Diastology LVIDs:         3.20 cm   LV e' medial:    7.72 cm/s LV PW:         0.90 cm   LV E/e' medial:  10.0 LV IVS:        0.80 cm   LV e' lateral:   9.68 cm/s LVOT diam:  1.90 cm   LV E/e' lateral: 8.0 LV SV:         64 LV SV Index:   42 LVOT Area:     2.84 cm  RIGHT VENTRICLE             IVC RV Basal diam:  3.10 cm     IVC diam: 2.10 cm RV S prime:     13.10 cm/s TAPSE (M-mode): 1.9 cm LEFT ATRIUM             Index        RIGHT ATRIUM           Index LA diam:        3.70 cm 2.43 cm/m   RA Area:     15.00 cm LA Vol (A2C):   47.6 ml 31.24 ml/m  RA Volume:   37.00 ml  24.28 ml/m LA Vol (A4C):   51.1 ml 33.53 ml/m LA Biplane Vol: 50.2 ml 32.94 ml/m  AORTIC VALVE AV Area (Vmax):    2.53 cm AV Area (Vmean):   2.18 cm AV Area (VTI):     2.33 cm AV Vmax:           149.33 cm/s AV Vmean:          98.633 cm/s AV VTI:            0.274 m AV Peak Grad:      8.9 mmHg AV Mean Grad:      4.7 mmHg LVOT Vmax:         133.00 cm/s LVOT Vmean:        75.800 cm/s LVOT VTI:          0.225 m LVOT/AV VTI ratio: 0.82 AI PHT:            466 msec  AORTA Ao Root diam: 3.00 cm Ao Asc diam:  3.10 cm MITRAL VALVE MV Area  (PHT): 4.40 cm    SHUNTS MV Decel Time: 173 msec    Systemic VTI:  0.22 m MR Peak grad: 103.0 mmHg   Systemic Diam: 1.90 cm MR Mean grad: 68.0 mmHg MR Vmax:      507.50 cm/s MR Vmean:     385.0 cm/s MV E velocity: 77.50 cm/s MV A velocity: 60.30 cm/s MV E/A ratio:  1.29 Jenkins Rouge MD Electronically signed by Jenkins Rouge MD Signature Date/Time: 03/07/2022/11:47:30 AM    Final    CT HEAD WO CONTRAST (5MM)  Result Date: 03/07/2022 CLINICAL DATA:  Status post right M1 thrombectomy EXAM: CT HEAD WITHOUT CONTRAST TECHNIQUE: Contiguous axial images were obtained from the base of the skull through the vertex without intravenous contrast. RADIATION DOSE REDUCTION: This exam was performed according to the departmental dose-optimization program which includes automated exposure control, adjustment of the mA and/or kV according to patient size and/or use of iterative reconstruction technique. COMPARISON:  03/07/2022 interventional CT, 03/18/2022 CT head and CTA head neck FINDINGS: Brain: Redemonstrated hypodensity in the bilateral frontal lobes, right parietal lobe, and right occipital lobe. Previously noted cerebellar hypodensity is less conspicuous on this exam. Hyperdense material in the right temporal and frontal lobe (series 3, images 14-19), which appears similar to the post thrombectomy CT and most likely represents contrast staining, although superimposed hemorrhage cannot be excluded. No new area of hypodensity. No mass, mass effect, or midline shift. No hydrocephalus. Vascular: No hyperdense vessel. Skull: Normal. Negative for fracture or focal lesion. Sinuses/Orbits: Mucosal thickening and bubbly fluid in the right maxillary  sinus. The orbits are unremarkable. Other: The mastoids are well aerated. IMPRESSION: 1. Hyperdense material in the right temporal and frontal lobe, which appears similar to the post thrombectomy CT and most likely represents contrast staining, although superimposed hemorrhage cannot be  excluded. 2. Redemonstrated hypodensity in the bilateral frontal lobes, right parietal lobe, and right occipital lobe, consistent with infarcts. Previously noted cerebellar hypodensity is less conspicuous on this exam. Electronically Signed   By: Merilyn Baba M.D.   On: 03/07/2022 02:32   DG Abd 1 View  Result Date: 03/08/2022 CLINICAL DATA:  [id number] Encounter for orogastric (OG) tube placement [id number] EXAM: ABDOMEN - 1 VIEW; PORTABLE CHEST - 1 VIEW COMPARISON:  None Available. FINDINGS: Enteric tube courses below the hemidiaphragm with tip and side port overlying the expected region of the gastric lumen. Enlarged cardiac silhouette with some component likely due to AP portable technique. The heart and mediastinal contours are within normal limits. Aortic calcification. Consolidative airspace opacity of the left upper lobe. No pulmonary edema. No pleural effusion. No pneumothorax. The bowel gas pattern is normal. Right upper quadrant clips noted. Bowel anastomotic staple sutures noted overlying the pelvis. Excretion of intravenous contrast within bilateral renal pelvis sees and urinary bladder lumen. No radio-opaque calculi or other significant radiographic abnormality are seen. No acute osseous abnormality. IMPRESSION: 1. Consolidative airspace opacity of the left upper lobe. Followup PA and lateral chest X-ray is recommended in 3-4 weeks following therapy to ensure resolution and exclude underlying malignancy. 2. Nonobstructive bowel gas pattern. 3. Enteric tube in good position. 4.  Aortic Atherosclerosis (ICD10-I70.0). Electronically Signed   By: Iven Finn M.D.   On: 03/13/2022 18:18   DG CHEST PORT 1 VIEW  Result Date: 03/12/2022 CLINICAL DATA:  [id number] Encounter for orogastric (OG) tube placement [id number] EXAM: ABDOMEN - 1 VIEW; PORTABLE CHEST - 1 VIEW COMPARISON:  None Available. FINDINGS: Enteric tube courses below the hemidiaphragm with tip and side port overlying the expected region of the  gastric lumen. Enlarged cardiac silhouette with some component likely due to AP portable technique. The heart and mediastinal contours are within normal limits. Aortic calcification. Consolidative airspace opacity of the left upper lobe. No pulmonary edema. No pleural effusion. No pneumothorax. The bowel gas pattern is normal. Right upper quadrant clips noted. Bowel anastomotic staple sutures noted overlying the pelvis. Excretion of intravenous contrast within bilateral renal pelvis sees and urinary bladder lumen. No radio-opaque calculi or other significant radiographic abnormality are seen. No acute osseous abnormality. IMPRESSION: 1. Consolidative airspace opacity of the left upper lobe. Followup PA and lateral chest X-ray is recommended in 3-4 weeks following therapy to ensure resolution and exclude underlying malignancy. 2. Nonobstructive bowel gas pattern. 3. Enteric tube in good position. 4.  Aortic Atherosclerosis (ICD10-I70.0). Electronically Signed   By: Iven Finn M.D.   On: 03/23/2022 18:18   CT HEAD CODE STROKE WO CONTRAST  Addendum Date: 03/05/2022   ADDENDUM REPORT: 03/02/2022 15:28 ADDENDUM: Findings discussed with Dr. Lorrin Goodell via telephone at 3:20 p.m. Electronically Signed   By: Margaretha Sheffield M.D.   On: 03/14/2022 15:28   Result Date: 03/21/2022 CLINICAL DATA:  Code stroke.  Neuro deficit, acute, stroke suspected EXAM: CT HEAD WITHOUT CONTRAST TECHNIQUE: Contiguous axial images were obtained from the base of the skull through the vertex without intravenous contrast. RADIATION DOSE REDUCTION: This exam was performed according to the departmental dose-optimization program which includes automated exposure control, adjustment of the mA and/or kV according to patient size and/or use of  iterative reconstruction technique. COMPARISON:  None Available. FINDINGS: Brain: Hypoattenuation loss of gray differentiation in bilateral frontal lobes and right parietal and occipital lobes,  compatible with acute infarcts. Also, small areas of hypoattenuation in bilateral cerebellum, suggestive of acute infarcts. No evidence of acute hemorrhage, mass lesion, midline shift or hydrocephalus. Vascular: No hyperdense vessel identified. Skull: No acute fracture. Sinuses/Orbits: Partially imaged right maxillary sinus mucosal thickening with air-fluid level. No acute orbital findings. Other: No mastoid effusions. IMPRESSION: 1. Findings concerning for acute infarcts in bilateral frontal lobes, right parietal and occipital lobes and bilateral cerebellum. Given involvement of multiple vascular territories, consider embolic etiology. Also, recommend MRI to further characterize. 2. No acute hemorrhage. Electronically Signed: By: Margaretha Sheffield M.D. On: 02/26/2022 15:11   CT ANGIO HEAD NECK W WO CM (CODE STROKE)  Result Date: 03/01/2022 CLINICAL DATA:  Neuro deficit, acute, stroke suspected EXAM: CT ANGIOGRAPHY HEAD AND NECK TECHNIQUE: Multidetector CT imaging of the head and neck was performed using the standard protocol during bolus administration of intravenous contrast. Multiplanar CT image reconstructions and MIPs were obtained to evaluate the vascular anatomy. Carotid stenosis measurements (when applicable) are obtained utilizing NASCET criteria, using the distal internal carotid diameter as the denominator. RADIATION DOSE REDUCTION: This exam was performed according to the departmental dose-optimization program which includes automated exposure control, adjustment of the mA and/or kV according to patient size and/or use of iterative reconstruction technique. COMPARISON:  None Available. FINDINGS: CTA NECK FINDINGS Aortic arch: Great vessel origins are patent. Right carotid system: No evidence of dissection, stenosis (50% or greater), or occlusion. Left carotid system: No evidence of dissection, stenosis (50% or greater), or occlusion. Vertebral arteries: Right dominant. No evidence of dissection,  stenosis (50% or greater), or occlusion. Skeleton: Multilevel degenerative change.  No acute findings. Other neck: No acute findings. Upper chest: Extensive consolidation in the left upper lobe. Review of the MIP images confirms the above findings CTA HEAD FINDINGS Anterior circulation: Bilateral intracranial ICAs are patent with mild calcific atherosclerosis. Occlusion of the distal right M1 MCA with some opacification of more distal right MCA branches. Left MCA and bilateral ACAs are patent. Posterior circulation: Bilateral intradural vertebral arteries, basilar artery and bilateral posterior cerebral arteries are patent without proximal high-grade stenosis. Venous sinuses: As permitted by contrast timing, patent. Review of the MIP images confirms the above findings IMPRESSION: 1. Distal right M1 MCA occlusion. 2. Extensive consolidation in the left upper lobe, suspicious for pneumonia and/or aspiration. Findings discussed with Dr. Lorrin Goodell via telephone at 3:20 p.m. Electronically Signed   By: Margaretha Sheffield M.D.   On: 03/21/2022 15:22    PHYSICAL EXAM  Physical Exam  Constitutional: Appears well-developed and well-nourished.   Cardiovascular: Normal rate and regular rhythm.  Respiratory: Effort normal, non-labored breathing  Neuro: Mental Status: Intubated with low dose sedation. Eyes open, not tracking or following commands.  Cranial Nerves: Minimal blink to threat on the right, does not track examiner, eyes midline, pupils equal 70mm and reactive, corneal cough and gag reflex intact. Head to the right.  Motor: Tone is normal. Bulk is normal.  Moves antigravity with right upper and lower extremity Left upper extremity is flaccid, minimal movement/ flicker of muscle with left lower extremity  Sensory: Withdraws to pain in right upper and lower extremity Minimal movement to painful stimuli in left lower extremity No movement in left upper extremity  Cerebellar: Unable to test      ASSESSMENT/PLAN [name] is a 76 y.o. female  with history of DVT, HTN, HLD, Afibb on Eliquis who presents with sudden onset left-sided weakness, right gaze deviation. Mechanical thrombectomy done 11/12 with TICI2c revascularization. Remained intubated post procedure, MRI done and shows bilateral hemisphere strokes. According the the patients daughter she started feeling poorly on Saturday and likely stopped taking her medicine that day, including her xarelto.   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  CTA head & neck Distal right M1 MCA occlusion.  Post IR CT Hyperdense material in the right temporal and frontal lobe, which appears similar to the post thrombectomy CT and most likely represents contrast staining, although superimposed hemorrhage cannot be excluded. Redemonstrated hypodensity in the bilateral frontal lobes, right parietal lobe, and right occipital lobe, consistent with infarcts. Previously noted cerebellar hypodensity is less conspicuous on this exam. MRI  Scattered and confluent areas of restricted diffusion in the bilateral cerebral and cerebellar hemispheres, consistent with acute infarcts, likely embolic.  Small amount of subarachnoid hemorrhage in the right temporoparietal occipital region, bilateral occipital horns and along the tentorium.  MRA  R MCA patent now 2D Echo EF 60-65%  LDL 57 HgbA1c 5.0 VTE prophylaxis - heparin subq Eliquis (apixaban) daily prior to admission, now holding eliquis in the setting of SAH and large infarct. Will start ASA 325 in am.  Therapy recommendations:  pending Disposition:  pending  Atrial fibrillation Home meds: Eliquis,  sotalol Not compliant with eliquis, at least 2 days out of eliquis Rate controlled currently 2 episodes of tachycardia (avoiding norepinephrine)  Hypertension Home meds:  amlodipine, metoprolol succinate, benazepril Unstable- currently requiring vasopressors Norepinephrine BP goal 120-140 first 24h post IR Long-term BP goal normotensive  Hyperlipidemia Home meds:  Atorvastatin 10mg , resumed in hospital LDL 57, goal < 70 Continue statin on discharge  Other Stroke Risk Factors Advanced Age >/= 62  Hx of DVT  Other Active Problems Acute hypoxic respiratory failure  Intubated Wean/extubate per CCM Sedation with propofol, fentanyl  Dysphagia  OG in place    Hospital day # 1  Patient seen and examined by NP/APP with MD. MD to update note as needed.   Janine Ores, DNP, FNP-BC [facility] Pager: [phone]  ATTENDING NOTE: I reviewed above note and agree with the assessment and plan. Pt was seen and examined.   76 year old female with history of A-fib on Eliquis but noncompliant, hypertension, hyperlipidemia, DVT admitted for confusion with hallucination for 2 days and then acute onset right-sided gaze, left-sided weakness.  CT showed bilateral frontal left more than right, and right posterior MCA and bilateral cerebellum subacute infarcts.  CTA head and neck showed right M1 occlusion.  Status post IR with TICI 2c.  Post IR CT showed right sylvian fissure SAH with right MCA large infarct.  MRI showed right large MCA infarct with bilateral cerebral and cerebellar scattered infarcts.  MRI showed right MCA patent now.  EF 60 to 65%, LDL 57, A1c 5.0.  Creatinine 0.71, WBC 11.1.  On exam, no family at bedside, patient was still intubated on sedation, eyes closed, not following commands.  No A-fib on telemetry.  With forced eye opening, eyes in mid position, not blinking to visual threat, doll's eyes absent, not tracking, PERRL. Corneal reflex present on the right,  gag  and cough present. Breathing over the vent.  Facial symmetry not able to test due to ET tube.  Tongue protrusion not cooperative. On pain stimulation, brisk withdraw against gravity on the right, however flaccid left upper extremity and slight withdraw left lower extremity. Sensation, coordination and gait not tested.  Etiology for patient stroke likely due to A-fib not compliant with Eliquis.  Currently not anticoagulation candidate given large size of infarct and small SAH.  Will consider aspirin tomorrow in the meantime.  Continue statin home dose.  CCM on board helping for vent management.  Currently on antibiotics for pneumonia.  Low BP on Lopressor, wean off as able.  PT/OT pending.  For detailed assessment and plan, please refer to above/below as I have made changes wherever appropriate.   Rosalin Hawking, MD PhD Stroke Neurology 03/07/2022 6:08 PM  This patient is critically ill due to embolic stroke with largest at right MCA, A-fib not compliant with anticoagulation, SAH and at significant risk of neurological worsening, death form recurrent stroke, recurrent ICH, brain herniation, heart failure, seizure. This patient's care requires constant monitoring of vital signs, hemodynamics, respiratory and cardiac monitoring, review of multiple databases, neurological assessment, discussion with family, other specialists and medical decision making of high complexity. I spent 40 minutes of neurocritical care time in the care of this patient.     To contact Stroke Continuity provider, please refer to [email or web address] After hours, contact General Neurology

## 2022-03-07 NOTE — Progress Notes (Signed)
[facility] ADULT ICU REPLACEMENT PROTOCOL   The patient does apply for the [facility] Adult ICU Electrolyte Replacment Protocol based on the criteria listed below:   1.Exclusion criteria: TCTS, ECMO, Dialysis, and Myasthenia Gravis patients 2. Is GFR >/= 30 ml/min? Yes.    Patient's GFR today is >60 3. Is SCr </= 2? Yes.   Patient's SCr is 0.71 mg/dL 4. Did SCr increase >/= 0.5 in 24 hours? No. 5.Pt's weight >40kg  Yes.   6. Abnormal electrolyte(s): K+ 3.5, Mag 1.7  7. Electrolytes replaced per protocol 8.  Call MD STAT for K+ </= 2.5, Phos </= 1, or Mag </= 1 Physician:  Dr. Stark Bray, Lilia Argue 03/07/2022 1:56 AM

## 2022-03-07 NOTE — Progress Notes (Signed)
eLink Physician-Brief Progress Note Patient Name: Sherry Olson DOB: 25-Apr-1946 MRN: [id number]   Date of Service  03/07/2022  HPI/Events of Note  Patient in atrial fibrillation with RVR, has been in the rhythm recently and was on Cardizem gtt but it was discontinued earlier today BP 122/53, MAP 72.  eICU Interventions  Cardizem gtt re-ordered with a 10 mg iv bolus, stat BMP, Mg+.        Thomasene Lot Wadell Craddock 03/07/2022, 11:37 PM

## 2022-03-07 NOTE — Progress Notes (Signed)
Pt transported to CT and back with no complications.  

## 2022-03-07 NOTE — Progress Notes (Signed)
PT Cancellation Note  Patient Details Name: CELESTER MORGAN MRN: [id number] DOB: 09/16/1945   Cancelled Treatment:    Reason Eval/Treat Not Completed: Patient not medically ready. Pt with cardiac and BP issues overnight. Pt remains intubated and sedated and is going down for MRI. PT to return as able, as appropriate to complete PT eval.   Lewis Shock, PT, DPT Acute Rehabilitation Services Secure chat preferred Office #: [phone]    [name] 03/07/2022, 8:01 AM

## 2022-03-07 NOTE — Progress Notes (Signed)
Sherry Olson, MRN:  [id number], DOB:  22-Jan-1946, LOS: 1 ADMISSION DATE:  03/04/2022, CONSULTATION DATE:  02/27/2022 REFERRING MD:  Dr Derry Lory, CHIEF COMPLAINT:  CVA   History of Present Illness:  Patient admitted with CVA Right MCA M1 segment occlusion, s/p revascularization.  Achieved TICI 2C revascularization  Brought in by ambulance code stroke, left-sided weakness and facial droop Last known well 1345 today-03/18/2022 Noted some confusion over the last few days with some hallucinations  She has a history of hypertension, history of DVT, hyperlipidemia, atrial fibrillation, on Eliquis  Pertinent  Medical History   Past Medical History:  Diagnosis Date   Coronary artery disease    DVT (deep venous thrombosis) (HCC)    HLD (hyperlipidemia)    Hypertension    Significant Hospital Events: Including procedures, antibiotic start and stop dates in addition to other pertinent events   Code stroke CT-IMPRESSION: 1. Findings concerning for acute infarcts in bilateral frontal lobes, right parietal and occipital lobes and bilateral cerebellum. Given involvement of multiple vascular territories, consider embolic etiology. Also, recommend MRI to further characterize. 2. No acute hemorrhage. 11/13 tachycardia overnight. Briefly in AF requiring diltiazem, but converted back to NS. Dilt turned off. Phenylephrine needed for post IR BP goals.  Interim History / Subjective:  Post intervention On vent, sedated  Objective   Blood pressure (!) 121/48, pulse 69, temperature 99 F (37.2 C), temperature source Axillary, resp. rate 16, height 5\' 3"  (1.6 m), weight 51.8 kg, SpO2 100 %.    Vent Mode: PRVC FiO2 (%):  [50 %-100 %] 50 % Set Rate:  [16 bmp-18 bmp] 16 bmp Vt Set:  [410 mL-500 mL] 410 mL PEEP:  [5 cmH20] 5 cmH20 Plateau Pressure:  [16 cmH20-21 cmH20] 17 cmH20   Intake/Output Summary (Last 24 hours) at 03/07/2022 0943 Last data filed at 03/07/2022 0900 Gross per 24 hour   Intake 4224.04 ml  Output 445 ml  Net 3779.04 ml    Filed Weights   03/10/2022 1400  Weight: 51.8 kg    Examination: General: Elderly female on vent HENT: [place]/AT. Midline gaze. PERRL.  Lungs: Clear bilateral breath sounds Cardiovascular: RRR, rate 80s. No MRG Abdomen: Soft, normoactive.  Extremities: No acute deformity.  Neuro: Eyes open spontaneously. Not following commands. Move R spontaneously. L to pain.   Resolved Hospital Problem list     Assessment & Plan:   Acute ischemic stroke with MCA occlusion on the right post revascularization Some concern for SAH vs hemorrhagic conversion vs contrast staining - Per stroke service - SBP goal 120-140 mmHg, requiring peripheral phenylephrine - MRI pending 1200 - Holding AC - Echo  Atrial fibrillation with fast ventricular response in the 170s and 180s Chronic AF on Eliquis - Briefly required diltiazem - DC diltiazem (hypotensive). Amiodarone allergy. Hopefully she remains sinus/rate controlled.  - Holding AC  Acute hypoxemic respiratory failure - Maintain ventilator support - Can evaluate for extubation post MRI, but I think low likelihood of extubation today. - Propofol infusion, PRN fentanyl for sedation.  RASS goal -1 to -2.  - WUA/SBT as tolerated - VAP bundle  Hypertension CAD - holding home benazepril, amlodipine, lasix (not taking), metoprolol, sotalol - Holding home ASA  History of DVT (2012) followed by vascular in [place]. AC stopped in 2019. - Monitor   Best Practice (right click and "Reselect all SmartList Selections" daily)   Diet/type: NPO DVT prophylaxis: prophylactic heparin  GI prophylaxis: PPI Lines: N/A Foley:  N/A Code Status:  full  code Last date of multidisciplinary goals of care discussion [ 11/12 ]   Critical care time 42 minutes   Georgann Housekeeper, AGACNP-BC [place] for personal pager PCCM on call pager [phone] until 7pm. Please call  Elink 7p-7a. [phone]  03/07/2022 10:52 AM

## 2022-03-07 NOTE — Progress Notes (Signed)
RT NOTE: patient placed on CPAP/PSV of 12/5 at 1544.  Currently tolerating well.  Will continue to monitor.

## 2022-03-07 NOTE — Progress Notes (Signed)
Beginning of shift pt's HR was in 120s but appeared to be sinus tach. EKG taken bedside by RN while waiting for Cardizem from pharmacy. When Cardizem arrived, pt's HR was below the 115 HR parameter per order in [facility]. Cardizem was not given, MD aware. Soon after, BP started to drop and HR started to creep up. Neo was added to help maintain BP goal of 130-150. Fentanyl prn order added. Fentanyl given and it was effective in reducing HR in 130s. Spoke with MD about adding a Fentanyl drip since pt responded well to prn doses. Pt later into the night had an episode of A.fib RVR that was communicated to provider and which Cardizem was then started. Another EKG was taken that showed critical result of a STEMI. EKG results reviewed with two other nurses with no apparent ST-elevation. MD aware and another EKG was taken and did not show STEMI. Cardizem was infusing until pt's HR had a sudden decrease from 120s to 60s, with the lowest being 58. Cardizem was then stopped and E-link notified.    Pt was requiring more Neo without having increased any of the sedation. Spoke with Neuro MD in regards to the increased Neo requirement and poor urine output. Neuro MD stated that new BP goal 120-150, Neuro MD deferred urine output to CCM. 500 normal saline bolus ordered.

## 2022-03-07 NOTE — Progress Notes (Signed)
Attending:    Subjective: MCA stroke, s/p thrombectomy (on eliquis) Remains mechanically ventilated Going for MRI brain soon  Objective: Vitals:   03/07/22 0810 03/07/22 0830 03/07/22 0845 03/07/22 0900  BP: (!) 125/50 (!) 125/49 (!) 127/52 (!) 121/48  Pulse: 70 68 69 69  Resp: 16 16 16 16   Temp:      TempSrc:      SpO2: 100% 100% 100% 100%  Weight:      Height:       Vent Mode: PRVC FiO2 (%):  [50 %-100 %] 50 % Set Rate:  [16 bmp-18 bmp] 16 bmp Vt Set:  [410 mL-500 mL] 410 mL PEEP:  [5 cmH20] 5 cmH20 Plateau Pressure:  [16 cmH20-21 cmH20] 17 cmH20  Intake/Output Summary (Last 24 hours) at 03/07/2022 1009 Last data filed at 03/07/2022 0900 Gross per 24 hour  Intake 4224.04 ml  Output 445 ml  Net 3779.04 ml    General:  In bed on vent HENT: NCAT ETT in place PULM: CTA B, vent supported breathing CV: RRR, no mgr GI: BS+, soft, nontender MSK: normal bulk and tone Neuro: sedated on vent    CBC    Component Value Date/Time   WBC 11.1 (H) 03/02/2022 1500   RBC 4.98 03/17/2022 1500   HGB 11.2 (L) 02/25/2022 1810   HGB 12.3 12/30/2011 0952   HCT 33.0 (L) 03/11/2022 1810   HCT 37.5 12/30/2011 0952   PLT 104 (L) 02/28/2022 1500   PLT 168 12/30/2011 0952   MCV 85.3 03/22/2022 1500   MCV 88 12/30/2011 0952   MCH 28.5 03/12/2022 1500   MCHC 33.4 02/24/2022 1500   RDW 13.3 03/05/2022 1500   RDW 13.4 12/30/2011 0952   LYMPHSABS 1.7 03/04/2022 1500   LYMPHSABS 1.2 12/30/2011 0952   MONOABS 1.0 03/02/2022 1500   MONOABS 0.4 12/30/2011 0952   EOSABS 0.1 03/21/2022 1500   EOSABS 0.1 12/30/2011 0952   BASOSABS 0.1 03/11/2022 1500   BASOSABS 0.1 12/30/2011 0952    BMET    Component Value Date/Time   NA 141 03/07/2022 0547   K 4.3 03/07/2022 0547   CL 110 03/07/2022 0547   CO2 20 (L) 03/07/2022 0547   GLUCOSE 107 (H) 03/07/2022 0547   BUN 14 03/07/2022 0547   CREATININE 0.72 03/07/2022 0547   CALCIUM 7.8 (L) 03/07/2022 0547   GFRNONAA >60 03/07/2022 0547     CXR images LUL infiltrate, ETT in place  Impression/Plan: Acute respiratory failure hypoxemia due to inability to protect airway> cont full vent support, SBT after MRI brain; possible SAH, monitor head imaging LUL infiltrate with leukocytosis after stroke, high likelihood of CAP/aspiration pneumonia> trach aspirate culture, start ceftriaxone/azithro R MCA stroke with infarct due to embolic phenomena> MRI Brain, secondary stroke prevention per neuro Atrial fibrillation with RVR> tele, monitor heart rate, continue metoprolol  Husband updated bedside  My cc time 22 minutes  03/09/2022, MD [place] PCCM Pager: [phone] Cell: [phone] After 7pm: [phone]

## 2022-03-07 NOTE — Progress Notes (Signed)
Pt transported to MRI and back to 4N23 on the vent without any complications.

## 2022-03-07 NOTE — Progress Notes (Signed)
  Transition of Care [facility]) Screening Note   Patient Details  Name: Sherry Olson Date of Birth: 18-Aug-1945   [facility]) CM/SW Contact:    Mearl Latin, LCSW Phone Number: 03/07/2022, 11:58 AM    Transition of Care Department [facility]) has reviewed patient. We will continue to monitor patient advancement through interdisciplinary progression rounds. If new patient transition needs arise, please place a TOC consult.

## 2022-03-07 NOTE — Progress Notes (Addendum)
Patient's arterial line does not have a waveform.  Blood pressures not accurate.  Arterial line remove.  Neuro MD and NP made aware.

## 2022-03-07 NOTE — Progress Notes (Signed)
Referring Physician(s): Erick Blinks, MD  Supervising Physician: Julieanne Cotton  Patient Status:  [facility] - In-pt  Chief Complaint: Follow up right MCA thrombectomy March 29, 2022 in NIR  Subjective:  Patient seen at bedside, she remains ventilated and sedated. No visitors present. Per RN issues overnight with BP/HR - currently on neo. Sedation turned off for about an hour yesterday with some movement on the right and eye opening, did not appear to be following commands. Currently receiving propofol this morning. No issues with site/peripheral pulses overnight.   Allergies: Amiodarone, Colesevelam, and Welchol [colesevelam hcl]  Medications: Prior to Admission medications   Medication Sig Start Date End Date Taking? Authorizing Provider  colchicine 0.6 MG tablet Take 0.6 mg by mouth daily. 02/21/22  Yes [provider]  doxycycline (VIBRA-TABS) 100 MG tablet Take 100 mg by mouth 2 (two) times daily. 02/28/22  Yes [provider]  ELIQUIS 5 MG TABS tablet Take 5 mg by mouth 2 (two) times daily. 02/04/22  Yes [provider]  indomethacin (INDOCIN) 25 MG capsule Take 25 mg by mouth 2 (two) times daily. 02/21/22  Yes [provider]  levofloxacin (LEVAQUIN) 500 MG tablet Take 500 mg by mouth daily. 01/24/22  Yes [provider]  metoprolol tartrate (LOPRESSOR) 100 MG tablet Take 100 mg by mouth 2 (two) times daily. 02/10/22  Yes [provider]  montelukast (SINGULAIR) 10 MG tablet Take 10 mg by mouth daily. 01/18/22  Yes [provider]  sotalol (BETAPACE) 80 MG tablet Take 80 mg by mouth 2 (two) times daily. 02/04/22  Yes [provider]  traMADol (ULTRAM) 50 MG tablet Take 50 mg by mouth 2 (two) times daily as needed. 01/21/22  Yes [provider]  amLODipine (NORVASC) 5 MG tablet Take 5 mg by mouth daily.    [provider]  amoxicillin-clavulanate (AUGMENTIN) 875-125 MG tablet Take 1 tablet by  mouth 2 (two) times daily for 7 days. 02/28/22 03/07/22  Corena Herter, MD  aspirin 81 MG chewable tablet Chew by mouth daily.    [provider]  atorvastatin (LIPITOR) 10 MG tablet Take 10 mg by mouth daily.    [provider]  benazepril (LOTENSIN) 20 MG tablet Take 20 mg by mouth daily.    [provider]  Calcium Carbonate (CALCIUM 600 PO) Take by mouth daily.    [provider]  cholecalciferol (VITAMIN D) 1000 units tablet Take 1,000 Units by mouth daily.    [provider]  CITALOPRAM HYDROBROMIDE PO Take by mouth.    [provider]  dicyclomine (BENTYL) 10 MG capsule Take 10 mg by mouth 4 (four) times daily -  before meals and at bedtime. Patient not taking: Reported on 02/26/2021    [provider]  doxycycline (ADOXA) 100 MG tablet Take 1 tablet (100 mg total) by mouth 2 (two) times daily for 7 days. 02/28/22 03/07/22  Corena Herter, MD  FLUTICASONE PROPIONATE EX Apply topically. Patient not taking: Reported on 02/26/2021    [provider]  furosemide (LASIX) 20 MG tablet Take 20 mg by mouth daily. Patient not taking: Reported on 02/26/2021    [provider]  LORazepam (ATIVAN) 0.5 MG tablet Take 0.5 mg by mouth as needed for anxiety.    [provider]  METOPROLOL SUCCINATE PO Take by mouth.    [provider]  omeprazole (PRILOSEC) 40 MG capsule Take 40 mg by mouth daily.    [provider]  ondansetron (ZOFRAN-ODT) 4  MG disintegrating tablet Take 1 tablet (4 mg total) by mouth every 8 (eight) hours as needed for nausea or vomiting. 02/28/22   Nathaniel Man, MD     Vital Signs: BP (!) 125/49   Pulse 68   Temp 99 F (37.2 C) (Axillary)   Resp 16   Ht 5\' 3"  (1.6 m)   Wt 114 lb 3.2 oz (51.8 kg)   SpO2 100%   BMI 20.23 kg/m   Physical Exam Vitals and nursing note reviewed.  Constitutional:      Comments: Sedated, ventilated. Does not open eyes to touch/voice.   HENT:     Head: Normocephalic.  Cardiovascular:     Rate and Rhythm: Normal rate and regular rhythm.     Comments: (+) right CFA puncture site soft, non-pulsatile, minimal bruising. Dressing clean, dry, intact. Palpable peripheral pulses bilaterally. Pulmonary:     Comments: Vent sounds Abdominal:     Palpations: Abdomen is soft.  Skin:    General: Skin is warm and dry.     Imaging: CT HEAD WO CONTRAST (5MM)  Result Date: 03/07/2022 CLINICAL DATA:  Status post right M1 thrombectomy EXAM: CT HEAD WITHOUT CONTRAST TECHNIQUE: Contiguous axial images were obtained from the base of the skull through the vertex without intravenous contrast. RADIATION DOSE REDUCTION: This exam was performed according to the departmental dose-optimization program which includes automated exposure control, adjustment of the mA and/or kV according to patient size and/or use of iterative reconstruction technique. COMPARISON:  03/07/2022 interventional CT, 03/08/2022 CT head and CTA head neck FINDINGS: Brain: Redemonstrated hypodensity in the bilateral frontal lobes, right parietal lobe, and right occipital lobe. Previously noted cerebellar hypodensity is less conspicuous on this exam. Hyperdense material in the right temporal and frontal lobe (series 3, images 14-19), which appears similar to the post thrombectomy CT and most likely represents contrast staining, although superimposed hemorrhage cannot be excluded. No new area of hypodensity. No mass, mass effect, or midline shift. No hydrocephalus. Vascular: No hyperdense vessel. Skull: Normal. Negative for fracture or focal lesion. Sinuses/Orbits: Mucosal thickening and bubbly fluid in the right maxillary sinus. The orbits are unremarkable. Other: The mastoids are well aerated. IMPRESSION: 1. Hyperdense material in the right temporal and frontal lobe, which appears similar to the post thrombectomy CT and most likely represents contrast staining, although superimposed  hemorrhage cannot be excluded. 2. Redemonstrated hypodensity in the bilateral frontal lobes, right parietal lobe, and right occipital lobe, consistent with infarcts. Previously noted cerebellar hypodensity is less conspicuous on this exam. Electronically Signed   By: Merilyn Baba M.D.   On: 03/07/2022 02:32   DG Abd 1 View  Result Date: 03/22/2022 CLINICAL DATA:  [id number] Encounter for orogastric (OG) tube placement [id number] EXAM: ABDOMEN - 1 VIEW; PORTABLE CHEST - 1 VIEW COMPARISON:  None Available. FINDINGS: Enteric tube courses below the hemidiaphragm with tip and side port overlying the expected region of the gastric lumen. Enlarged cardiac silhouette with some component likely due to AP portable technique. The heart and mediastinal contours are within normal limits. Aortic calcification. Consolidative airspace opacity of the left upper lobe. No pulmonary edema. No pleural effusion. No pneumothorax. The bowel gas pattern is normal. Right upper quadrant clips noted. Bowel anastomotic staple sutures noted overlying the pelvis. Excretion of intravenous contrast within bilateral renal pelvis sees and urinary bladder lumen. No radio-opaque calculi or other significant radiographic abnormality are seen. No acute osseous abnormality. IMPRESSION: 1. Consolidative airspace opacity of the left upper lobe. Followup PA and lateral  chest X-ray is recommended in 3-4 weeks following therapy to ensure resolution and exclude underlying malignancy. 2. Nonobstructive bowel gas pattern. 3. Enteric tube in good position. 4.  Aortic Atherosclerosis (ICD10-I70.0). Electronically Signed   By: Iven Finn M.D.   On: 03/02/2022 18:18   DG CHEST PORT 1 VIEW  Result Date: 03/07/2022 CLINICAL DATA:  [id number] Encounter for orogastric (OG) tube placement [id number] EXAM: ABDOMEN - 1 VIEW; PORTABLE CHEST - 1 VIEW COMPARISON:  None Available. FINDINGS: Enteric tube courses below the hemidiaphragm with tip and side port overlying the  expected region of the gastric lumen. Enlarged cardiac silhouette with some component likely due to AP portable technique. The heart and mediastinal contours are within normal limits. Aortic calcification. Consolidative airspace opacity of the left upper lobe. No pulmonary edema. No pleural effusion. No pneumothorax. The bowel gas pattern is normal. Right upper quadrant clips noted. Bowel anastomotic staple sutures noted overlying the pelvis. Excretion of intravenous contrast within bilateral renal pelvis sees and urinary bladder lumen. No radio-opaque calculi or other significant radiographic abnormality are seen. No acute osseous abnormality. IMPRESSION: 1. Consolidative airspace opacity of the left upper lobe. Followup PA and lateral chest X-ray is recommended in 3-4 weeks following therapy to ensure resolution and exclude underlying malignancy. 2. Nonobstructive bowel gas pattern. 3. Enteric tube in good position. 4.  Aortic Atherosclerosis (ICD10-I70.0). Electronically Signed   By: Iven Finn M.D.   On: 03/23/2022 18:18   CT HEAD CODE STROKE WO CONTRAST  Addendum Date: 03/12/2022   ADDENDUM REPORT: 03/15/2022 15:28 ADDENDUM: Findings discussed with Dr. Lorrin Goodell via telephone at 3:20 p.m. Electronically Signed   By: Margaretha Sheffield M.D.   On: 03/04/2022 15:28   Result Date: 02/23/2022 CLINICAL DATA:  Code stroke.  Neuro deficit, acute, stroke suspected EXAM: CT HEAD WITHOUT CONTRAST TECHNIQUE: Contiguous axial images were obtained from the base of the skull through the vertex without intravenous contrast. RADIATION DOSE REDUCTION: This exam was performed according to the departmental dose-optimization program which includes automated exposure control, adjustment of the mA and/or kV according to patient size and/or use of iterative reconstruction technique. COMPARISON:  None Available. FINDINGS: Brain: Hypoattenuation loss of gray differentiation in bilateral frontal lobes and right parietal and  occipital lobes, compatible with acute infarcts. Also, small areas of hypoattenuation in bilateral cerebellum, suggestive of acute infarcts. No evidence of acute hemorrhage, mass lesion, midline shift or hydrocephalus. Vascular: No hyperdense vessel identified. Skull: No acute fracture. Sinuses/Orbits: Partially imaged right maxillary sinus mucosal thickening with air-fluid level. No acute orbital findings. Other: No mastoid effusions. IMPRESSION: 1. Findings concerning for acute infarcts in bilateral frontal lobes, right parietal and occipital lobes and bilateral cerebellum. Given involvement of multiple vascular territories, consider embolic etiology. Also, recommend MRI to further characterize. 2. No acute hemorrhage. Electronically Signed: By: Margaretha Sheffield M.D. On: 03/02/2022 15:11   CT ANGIO HEAD NECK W WO CM (CODE STROKE)  Result Date: 03/05/2022 CLINICAL DATA:  Neuro deficit, acute, stroke suspected EXAM: CT ANGIOGRAPHY HEAD AND NECK TECHNIQUE: Multidetector CT imaging of the head and neck was performed using the standard protocol during bolus administration of intravenous contrast. Multiplanar CT image reconstructions and MIPs were obtained to evaluate the vascular anatomy. Carotid stenosis measurements (when applicable) are obtained utilizing NASCET criteria, using the distal internal carotid diameter as the denominator. RADIATION DOSE REDUCTION: This exam was performed according to the departmental dose-optimization program which includes automated exposure control, adjustment of the mA and/or kV according to patient size and/or  use of iterative reconstruction technique. COMPARISON:  None Available. FINDINGS: CTA NECK FINDINGS Aortic arch: Great vessel origins are patent. Right carotid system: No evidence of dissection, stenosis (50% or greater), or occlusion. Left carotid system: No evidence of dissection, stenosis (50% or greater), or occlusion. Vertebral arteries: Right dominant. No evidence  of dissection, stenosis (50% or greater), or occlusion. Skeleton: Multilevel degenerative change.  No acute findings. Other neck: No acute findings. Upper chest: Extensive consolidation in the left upper lobe. Review of the MIP images confirms the above findings CTA HEAD FINDINGS Anterior circulation: Bilateral intracranial ICAs are patent with mild calcific atherosclerosis. Occlusion of the distal right M1 MCA with some opacification of more distal right MCA branches. Left MCA and bilateral ACAs are patent. Posterior circulation: Bilateral intradural vertebral arteries, basilar artery and bilateral posterior cerebral arteries are patent without proximal high-grade stenosis. Venous sinuses: As permitted by contrast timing, patent. Review of the MIP images confirms the above findings IMPRESSION: 1. Distal right M1 MCA occlusion. 2. Extensive consolidation in the left upper lobe, suspicious for pneumonia and/or aspiration. Findings discussed with Dr. Lorrin Goodell via telephone at 3:20 p.m. Electronically Signed   By: Margaretha Sheffield M.D.   On: 03/19/2022 15:22    Labs:  CBC: Recent Labs    02/28/22 1003 03/20/2022 1500 03/10/2022 1505 03/03/2022 1810  WBC 5.7 11.1*  --   --   HGB 13.8 14.2 14.6 11.2*  HCT 40.9 42.5 43.0 33.0*  PLT 194 104*  --   --     COAGS: Recent Labs    03/04/2022 1500  INR 1.8*  APTT 29    BMP: Recent Labs    02/28/22 1003 02/24/2022 1500 03/10/2022 1505 03/12/2022 1810 03/07/22 0015  NA 140 140 140 137 142  K 4.2 4.0 4.0 3.4* 3.5  CL 102 101 102  --  110  CO2 30 25  --   --  22  GLUCOSE 101* 108* 104*  --  115*  BUN 11 18 19   --  15  CALCIUM 9.8 9.4  --   --  8.0*  CREATININE 0.75 0.87 0.80  --  0.71  GFRNONAA >60 >60  --   --  >60    LIVER FUNCTION TESTS: Recent Labs    02/28/22 1003 03/12/2022 1500  BILITOT 1.0 1.1  AST 27 60*  ALT 17 23  ALKPHOS 68 61  PROT 7.1 6.4*  ALBUMIN 4.1 3.5    Assessment and Plan:  76 y/o F admitted 11/12 as a code stroke  subsequently found to have occluded MCA M1 segment for which she underwent successful thrombectomy in NIR that same day who is seen today for post procedure follow up.  Patient remains ventilated/sedated this morning, per RN when sedation was turned off yesterday there was some movement on the right but no significant movement on the left. Patient was also noted to open eyes but did not reliably follow commands. Right CFA puncture site soft, non-pulsatile.   No NIR needs at this time, we will continue to follow peripherally until extubated. Routine wound care of right CFA site until healed, do not submerge x 7 days.   Plans per neurology/CCM.   Please call with questions or concerns.  Electronically Signed: Joaquim Nam, PA-C 03/07/2022, 9:05 AM   I spent a total of 15 Minutes at the the patient's bedside AND on the patient's hospital floor or unit, greater than 50% of which was counseling/coordinating care for MCA thrombectomy follow up.

## 2022-03-07 NOTE — Progress Notes (Signed)
Patient's groin site started bleeding at 1650, pressure held and quick clot applied.    Dr. Corliss Skains paged and verbal order to keep patient flat for four hours. Stroke team  also updated.

## 2022-03-07 NOTE — Progress Notes (Incomplete)
Echocardiogram 2D Echocardiogram has been performed.  Sherry Olson 03/07/2022, 11:28 AM

## 2022-03-07 NOTE — Progress Notes (Addendum)
Patient has been unstable for scan due to cardiac issues, but neuro stable per nursing.  Appreciate excellent nursing care and CCM care.  Head CT when stable for scan, hold on MRI until fully stabilized, neurology will follow along,   Imaging reviewed, agree with radiology that the scan is overall stable  Additionally, nursing concerned about significant need for Levophed to maintain blood pressure goals.  Notably her examination has been stable even when her systolic blood pressures are in the 120s.  Also her blood pressure does rise when she is awakened.  Liberalized blood pressure goal to systolic blood pressure 120-150  Defer concerns about urine output to critical care team, noting that given her substantial strokes normal saline is preferable to lactated Ringer's to avoid fluid shifts due to relatively low sodium and lactated Ringer's  Sherry Dare MD-PhD [facility] [phone]  Available 7 PM to 7 AM, outside of these hours please call Neurologist on call as listed on Amion.  15 minutes of critical care time

## 2022-03-08 DIAGNOSIS — J189 Pneumonia, unspecified organism: Secondary | ICD-10-CM

## 2022-03-08 DIAGNOSIS — I471 Supraventricular tachycardia, unspecified: Secondary | ICD-10-CM

## 2022-03-08 DIAGNOSIS — I63511 Cerebral infarction due to unspecified occlusion or stenosis of right middle cerebral artery: Secondary | ICD-10-CM | POA: Diagnosis not present

## 2022-03-08 DIAGNOSIS — J9601 Acute respiratory failure with hypoxia: Secondary | ICD-10-CM

## 2022-03-08 DIAGNOSIS — I4891 Unspecified atrial fibrillation: Secondary | ICD-10-CM

## 2022-03-08 DIAGNOSIS — I959 Hypotension, unspecified: Secondary | ICD-10-CM | POA: Diagnosis not present

## 2022-03-08 DIAGNOSIS — E440 Moderate protein-calorie malnutrition: Secondary | ICD-10-CM | POA: Insufficient documentation

## 2022-03-08 DIAGNOSIS — J9602 Acute respiratory failure with hypercapnia: Secondary | ICD-10-CM

## 2022-03-08 LAB — CBC
HCT: 34.6 % — ABNORMAL LOW (ref 36.0–46.0)
Hemoglobin: 10.7 g/dL — ABNORMAL LOW (ref 12.0–15.0)
MCH: 29.2 pg (ref 26.0–34.0)
MCHC: 30.9 g/dL (ref 30.0–36.0)
MCV: 94.3 fL (ref 80.0–100.0)
Platelets: 105 10*3/uL — ABNORMAL LOW (ref 150–400)
RBC: 3.67 MIL/uL — ABNORMAL LOW (ref 3.87–5.11)
RDW: 14.1 % (ref 11.5–15.5)
WBC: 13.5 10*3/uL — ABNORMAL HIGH (ref 4.0–10.5)
nRBC: 0.1 % (ref 0.0–0.2)

## 2022-03-08 LAB — BASIC METABOLIC PANEL WITH GFR
Anion gap: 8 (ref 5–15)
Anion gap: 9 (ref 5–15)
BUN: 10 mg/dL (ref 8–23)
BUN: 12 mg/dL (ref 8–23)
CO2: 18 mmol/L — ABNORMAL LOW (ref 22–32)
CO2: 20 mmol/L — ABNORMAL LOW (ref 22–32)
Calcium: 7.7 mg/dL — ABNORMAL LOW (ref 8.9–10.3)
Calcium: 8.1 mg/dL — ABNORMAL LOW (ref 8.9–10.3)
Chloride: 113 mmol/L — ABNORMAL HIGH (ref 98–111)
Chloride: 115 mmol/L — ABNORMAL HIGH (ref 98–111)
Creatinine, Ser: 0.66 mg/dL (ref 0.44–1.00)
Creatinine, Ser: 0.72 mg/dL (ref 0.44–1.00)
GFR, Estimated: >60 mL/min (ref >60)
GFR, Estimated: >60 mL/min (ref >60)
Glucose, Bld: 97 mg/dL (ref 70–99)
Glucose, Bld: 97 mg/dL (ref 70–99)
Potassium: 4.4 mmol/L (ref 3.5–5.1)
Potassium: 4.4 mmol/L (ref 3.5–5.1)
Sodium: 140 mmol/L (ref 135–145)
Sodium: 143 mmol/L (ref 135–145)

## 2022-03-08 LAB — PROTIME-INR
INR: 1.5 — ABNORMAL HIGH (ref 0.8–1.2)
Prothrombin Time: 17.7 s — ABNORMAL HIGH (ref 11.4–15.2)

## 2022-03-08 LAB — TSH: TSH: 0.598 u[IU]/mL (ref 0.350–4.500)

## 2022-03-08 LAB — PHOSPHORUS: Phosphorus: 2.8 mg/dL (ref 2.5–4.6)

## 2022-03-08 LAB — MAGNESIUM
Magnesium: 2 mg/dL (ref 1.7–2.4)
Magnesium: 2.2 mg/dL (ref 1.7–2.4)
Magnesium: 2.2 mg/dL (ref 1.7–2.4)

## 2022-03-08 MED ORDER — DIGOXIN 0.25 MG/ML IJ SOLN
0.2500 mg | Freq: Once | INTRAMUSCULAR | Status: AC
  Administered 2022-03-08: 0.25 mg via INTRAVENOUS
  Filled 2022-03-08: qty 2

## 2022-03-08 MED ORDER — PROSOURCE TF20 ENFIT COMPATIBL EN LIQD: 60.0000 mL | Freq: Every day | ENTERAL | Status: DC

## 2022-03-08 MED ORDER — AMIODARONE HCL IN DEXTROSE 360-4.14 MG/200ML-% IV SOLN
60.0000 mg/h | INTRAVENOUS | Status: DC
  Administered 2022-03-08 (×2): 60 mg/h via INTRAVENOUS
  Filled 2022-03-08 (×2): qty 200

## 2022-03-08 MED ORDER — AMIODARONE HCL IN DEXTROSE 360-4.14 MG/200ML-% IV SOLN
30.0000 mg/h | INTRAVENOUS | Status: DC
  Administered 2022-03-08 – 2022-03-12 (×12): 30 mg/h via INTRAVENOUS
  Filled 2022-03-08 (×10): qty 200

## 2022-03-08 MED ORDER — PEPTAMEN 1.5 CAL PO LIQD
1000.0000 mL | ORAL | Status: DC
  Administered 2022-03-09: 1000 mL
  Filled 2022-03-08 (×4): qty 1000

## 2022-03-08 MED ORDER — AMIODARONE LOAD VIA INFUSION
150.0000 mg | Freq: Once | INTRAVENOUS | Status: AC
  Administered 2022-03-08: 150 mg via INTRAVENOUS
  Filled 2022-03-08: qty 83.34

## 2022-03-08 MED ORDER — DEXMEDETOMIDINE HCL IN NACL 400 MCG/100ML IV SOLN
INTRAVENOUS | Status: AC
  Filled 2022-03-08: qty 100

## 2022-03-08 MED ORDER — VITAL HIGH PROTEIN PO LIQD: 1000.0000 mL | ORAL | Status: DC

## 2022-03-08 MED ORDER — DEXMEDETOMIDINE HCL IN NACL 400 MCG/100ML IV SOLN
0.0000 ug/kg/h | INTRAVENOUS | Status: DC
  Administered 2022-03-08: 0.6 ug/kg/h via INTRAVENOUS
  Administered 2022-03-08: 0.4 ug/kg/h via INTRAVENOUS
  Administered 2022-03-08: 0.8 ug/kg/h via INTRAVENOUS
  Administered 2022-03-09: 1.2 ug/kg/h via INTRAVENOUS
  Administered 2022-03-09: 0.8 ug/kg/h via INTRAVENOUS
  Administered 2022-03-10 (×2): 1.2 ug/kg/h via INTRAVENOUS
  Administered 2022-03-10: 0.8 ug/kg/h via INTRAVENOUS
  Administered 2022-03-10: 1 ug/kg/h via INTRAVENOUS
  Administered 2022-03-11 – 2022-03-12 (×6): 1.2 ug/kg/h via INTRAVENOUS
  Administered 2022-03-12: 0.6 ug/kg/h via INTRAVENOUS
  Administered 2022-03-13 (×2): 1 ug/kg/h via INTRAVENOUS
  Administered 2022-03-13: 1.2 ug/kg/h via INTRAVENOUS
  Administered 2022-03-14: 0.6 ug/kg/h via INTRAVENOUS
  Administered 2022-03-14: 0.8 ug/kg/h via INTRAVENOUS
  Administered 2022-03-14: 1 ug/kg/h via INTRAVENOUS
  Administered 2022-03-15: 0.5 ug/kg/h via INTRAVENOUS
  Filled 2022-03-08 (×17): qty 100
  Filled 2022-03-08: qty 200
  Filled 2022-03-08 (×2): qty 100

## 2022-03-08 NOTE — Consult Note (Signed)
Cardiology Consultation   Patient ID: Theo Dillsrma J Pung MRN: [id number]; DOB: 02/25/1946  Admit date: 02/27/2022 Date of Consult: 03/08/2022  PCP:  Margaretann LovelessKhan, Neelam S, MD   [facility] Providers Cardiologist:  None        Patient Profile:   Theo Dillsrma J Waybright is a 76 y.o. female with a hx of hypertension, atrial fibrillation on Eliquis, hyperlipidemia, DVT who is being seen 03/08/2022 for the evaluation of atrial fibrillation at the request of Dr. Warrick Parisiangan.  History of Present Illness:   Ms. Aura CampsShambley is intubated and sedated during my encounter.  Medical history was obtained from chart review and medical staff.  Patient was admitted on 11/12 for evaluation of left-sided weakness and right gaze deviation. 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 Patient was intubated for airway protection.   Per medical team, the patient's atrial fibrillation has been well controlled on cardizem gtt until last night. Reviewed telemetry showed patient was in atrial fibrillation with RVR, SVT, and sinus rhythm intermittently.    Past Medical History:  Diagnosis Date   Coronary artery disease    DVT (deep venous thrombosis) (HCC)    HLD (hyperlipidemia)    Hypertension     Past Surgical History:  Procedure Laterality Date   BREAST BIOPSY Right 1996   negative   CHOLECYSTECTOMY     COLON SURGERY  2012   colon resection   COLONOSCOPY N/A 02/26/2021   Procedure: COLONOSCOPY;  Surgeon: Jaynie Collinsusso, Steven Michael, DO;  [facility];  Service: Gastroenterology;  Laterality: N/A;   RADIOLOGY WITH ANESTHESIA N/A 03/03/2022   Procedure: IR WITH ANESTHESIA;  Surgeon: Radiologist, Medication, MD;  Location: [facility] OR;  Service: Radiology;  Laterality: N/A;       Inpatient Medications: Scheduled Meds:  aspirin  325 mg Per Tube Daily   atorvastatin  10 mg Per Tube Daily   Chlorhexidine Gluconate Cloth  6 each Topical  Daily   docusate  100 mg Per Tube BID   heparin injection (subcutaneous)  5,000 Units Subcutaneous Q8H   mouth rinse  15 mL Mouth Rinse Q2H   pantoprazole (PROTONIX) IV  40 mg Intravenous QHS   polyethylene glycol  17 g Per Tube Daily   sodium chloride flush  3 mL Intravenous Once   Continuous Infusions:  sodium chloride Stopped (03/07/22 0710)   azithromycin Stopped (03/07/22 1515)   cefTRIAXone (ROCEPHIN)  IV Stopped (03/07/22 1558)   diltiazem (CARDIZEM) infusion 15 mg/hr (03/08/22 0100)   fentaNYL infusion INTRAVENOUS 50 mcg/hr (03/08/22 0100)   phenylephrine (NEO-SYNEPHRINE) Adult infusion 150 mcg/min (03/08/22 0339)   propofol (DIPRIVAN) infusion 15 mcg/kg/min (03/08/22 0100)   PRN Meds: acetaminophen **OR** acetaminophen (TYLENOL) oral liquid 160 mg/5 mL **OR** acetaminophen, fentaNYL, fentaNYL (SUBLIMAZE) injection, LORazepam, mouth rinse, senna-docusate  Allergies:    Allergies  Allergen Reactions   Amiodarone Palpitations    "Heart trouble" per patient's daughter and husband.   Colesevelam Nausea And Vomiting   Welchol [Colesevelam Hcl] Diarrhea and Nausea And Vomiting    Social History:   Social History   Socioeconomic History   Marital status: Married    Spouse name: Not on file   Number of children: Not on file   Years of education: Not on file   Highest education level: Not on file  Occupational History   Not on file  Tobacco Use   Smoking status: Never   Smokeless tobacco: Never  Vaping Use   Vaping Use: Never used  Substance and Sexual Activity   Alcohol use: Not Currently   Drug use: Never   Sexual activity: Not Currently    Birth control/protection: Post-menopausal  Other Topics Concern   Not on file  Social History Narrative   Not on file   Social Determinants of Health   Financial Resource Strain: Not on file  Food Insecurity: Not on file  Transportation Needs: Not on file  Physical Activity: Not on file  Stress: Not on file  Social  Connections: Not on file  Intimate Partner Violence: Not on file    Family History:    Family History  Problem Relation Age of Onset   Hypertension Father    Deep vein thrombosis Father    Heart attack Father    Breast cancer Neg Hx      ROS:  Please see the history of present illness.   All other ROS reviewed and negative.     Physical Exam/Data:   Vitals:   03/08/22 0315 03/08/22 0330 03/08/22 0347 03/08/22 0400  BP: 115/70 102/62    Pulse: 95 (!) 165 (!) 140   Resp: 17 15 16    Temp:    99 F (37.2 C)  TempSrc:    Oral  SpO2: 100% 99% 98%   Weight:      Height:        Intake/Output Summary (Last 24 hours) at 03/08/2022 0427 Last data filed at 03/08/2022 0339 Gross per 24 hour  Intake 3465.91 ml  Output 675 ml  Net 2790.91 ml      02/23/2022    2:00 PM 12/20/2021    1:08 PM 02/26/2021    9:09 AM  Last 3 Weights  Weight (lbs) 114 lb 3.2 oz 121 lb 12.8 oz 123 lb  Weight (kg) 51.8 kg 55.248 kg 55.792 kg     Body mass index is 20.23 kg/m.  General:  intubated HEENT: intubated Neck: no JVD Vascular: Distal pulses 2+ bilaterally Cardiac: Irregularly irregular heart rhythm, no murmur  Lungs: intubated  Abd: soft, Ext: no edema Musculoskeletal:  No deformities Skin: warm  Neuro:  sedated, responsive to pain stimualtion Psych: unable to assess   Laboratory Data:  High Sensitivity Troponin:  No results for input(s): "TROPONINIHS" in the last 720 hours.   Chemistry Recent Labs  Lab 03/07/22 0015 03/07/22 0547 03/07/22 2344  NA 142 141 140  K 3.5 4.3 4.4  CL 110 110 113*  CO2 22 20* 18*  GLUCOSE 115* 107* 97  BUN 15 14 12   CREATININE 0.71 0.72 0.72  CALCIUM 8.0* 7.8* 7.7*  MG 1.7  --  2.2  GFRNONAA >60 >60 >60  ANIONGAP 10 11 9     Recent Labs  Lab 02/26/2022 1500 03/07/22 0547  PROT 6.4* 4.4*  ALBUMIN 3.5 2.5*  AST 60* 45*  ALT 23 19  ALKPHOS 61 44  BILITOT 1.1 0.5   Lipids  Recent Labs  Lab 03/07/22 0547  CHOL 114  TRIG 125  HDL  32*  LDLCALC 57  CHOLHDL 3.6    Hematology Recent Labs  Lab 03/05/2022 1500 03/05/2022 1505 02/28/2022 1810  WBC 11.1*  --   --   RBC 4.98  --   --   HGB 14.2 14.6 11.2*  HCT 42.5 43.0 33.0*  MCV 85.3  --   --   MCH 28.5  --   --   MCHC 33.4  --   --   RDW  13.3  --   --   PLT 104*  --   --    Thyroid No results for input(s): "TSH", "FREET4" in the last 168 hours.  BNPNo results for input(s): "BNP", "PROBNP" in the last 168 hours.  DDimer No results for input(s): "DDIMER" in the last 168 hours.   Radiology/Studies:  MR ANGIO HEAD WO CONTRAST  Result Date: 03/07/2022 CLINICAL DATA:  Stroke, follow-up. EXAM: MRI HEAD WITHOUT CONTRAST MRA HEAD WITHOUT CONTRAST TECHNIQUE: Multiplanar, multi-echo pulse sequences of the brain and surrounding structures were acquired without intravenous contrast. Angiographic images of the Circle of Willis were acquired using MRA technique without intravenous contrast. COMPARISON:  Head CT March 07, 2022. FINDINGS: MRI HEAD FINDINGS Brain: Scattered and confluent areas of restricted diffusion are seen in the bilateral cerebral and cerebellar hemispheres with the largest confluent areas involving the right temporoparietal occipital region and bilateral frontal lobes. Susceptibility artifact is noted in the sulci of the right is temporoparietal occipital region, bilateral occipital horns and along the tentorium, with distribution similar to prior CT, likely representing subarachnoid hemorrhage. No significant mass effect, intraparenchymal hemorrhage, hydrocephalus or mass lesion. Vascular: Normal flow voids. Skull and upper cervical spine: Normal marrow signal. Sinuses/Orbits: Mucosal thickening and bubbly secretion within the right maxillary sinus. Right mastoid effusion. The orbits are maintained. Other: None. MRA HEAD FINDINGS Anterior circulation: The visualized portions of the distal cervical and intracranial internal carotid arteries are widely patent with  normal flow related enhancement. The bilateral anterior cerebral arteries and middle cerebral arteries are widely patent with anterograde flow without high-grade flow-limiting stenosis or proximal branch occlusion. No intracranial aneurysm within the anterior circulation. Posterior circulation: The vertebral arteries are widely patent with anterograde flow. Vertebrobasilar junction and basilar artery are widely patent with anterograde flow without evidence of basilar stenosis or aneurysm. Posterior cerebral arteries are normal bilaterally. No intracranial aneurysm within the posterior circulation. Anatomic variants: None significant. IMPRESSION: 1. Scattered and confluent areas of restricted diffusion in the bilateral cerebral and cerebellar hemispheres, consistent with acute infarcts, likely embolic. 2. Small amount of subarachnoid hemorrhage in the right temporoparietal occipital region, bilateral occipital horns and along the tentorium. 3. No evidence of high-grade flow-limiting stenosis or proximal branch occlusion in the intracranial circulation. Electronically Signed   By: Baldemar Lenis M.D.   On: 03/07/2022 13:22   MR BRAIN WO CONTRAST  Result Date: 03/07/2022 CLINICAL DATA:  Stroke, follow-up. EXAM: MRI HEAD WITHOUT CONTRAST MRA HEAD WITHOUT CONTRAST TECHNIQUE: Multiplanar, multi-echo pulse sequences of the brain and surrounding structures were acquired without intravenous contrast. Angiographic images of the Circle of Willis were acquired using MRA technique without intravenous contrast. COMPARISON:  Head CT March 07, 2022. FINDINGS: MRI HEAD FINDINGS Brain: Scattered and confluent areas of restricted diffusion are seen in the bilateral cerebral and cerebellar hemispheres with the largest confluent areas involving the right temporoparietal occipital region and bilateral frontal lobes. Susceptibility artifact is noted in the sulci of the right is temporoparietal occipital region,  bilateral occipital horns and along the tentorium, with distribution similar to prior CT, likely representing subarachnoid hemorrhage. No significant mass effect, intraparenchymal hemorrhage, hydrocephalus or mass lesion. Vascular: Normal flow voids. Skull and upper cervical spine: Normal marrow signal. Sinuses/Orbits: Mucosal thickening and bubbly secretion within the right maxillary sinus. Right mastoid effusion. The orbits are maintained. Other: None. MRA HEAD FINDINGS Anterior circulation: The visualized portions of the distal cervical and intracranial internal carotid arteries are widely patent with normal flow related enhancement. The  bilateral anterior cerebral arteries and middle cerebral arteries are widely patent with anterograde flow without high-grade flow-limiting stenosis or proximal branch occlusion. No intracranial aneurysm within the anterior circulation. Posterior circulation: The vertebral arteries are widely patent with anterograde flow. Vertebrobasilar junction and basilar artery are widely patent with anterograde flow without evidence of basilar stenosis or aneurysm. Posterior cerebral arteries are normal bilaterally. No intracranial aneurysm within the posterior circulation. Anatomic variants: None significant. IMPRESSION: 1. Scattered and confluent areas of restricted diffusion in the bilateral cerebral and cerebellar hemispheres, consistent with acute infarcts, likely embolic. 2. Small amount of subarachnoid hemorrhage in the right temporoparietal occipital region, bilateral occipital horns and along the tentorium. 3. No evidence of high-grade flow-limiting stenosis or proximal branch occlusion in the intracranial circulation. Electronically Signed   By: Baldemar Lenis M.D.   On: 03/07/2022 13:22   ECHOCARDIOGRAM COMPLETE  Result Date: 03/07/2022    ECHOCARDIOGRAM REPORT   Patient Name:   [facility] Date of Exam: 03/07/2022 Medical Rec #:  [id number]       Height:        63.0 in Accession #:    [id number]      Weight:       114.2 lb Date of Birth:  27-Aug-1945        BSA:          1.524 m Patient Age:    76 years        BP:           121/48 mmHg Patient Gender: F               HR:           69 bpm. Exam Location:  Inpatient Procedure: 2D Echo, Cardiac Doppler and Color Doppler Indications:    Stroke I63.9  History:        Patient has no prior history of Echocardiogram examinations.                 CAD, Stroke; Risk Factors:Hypertension and Dyslipidemia.  Sonographer:    Lucendia Herrlich Referring Phys: [id number] [facility] [place]HALIQDINA IMPRESSIONS  1. Left ventricular ejection fraction, by estimation, is 60 to 65%. The left ventricle has normal function. The left ventricle has no regional wall motion abnormalities. Left ventricular diastolic parameters were normal.  2. Right ventricular systolic function is normal. The right ventricular size is normal.  3. Moderate but not quite circumferential effusion with dilated IVC Mild diastolic RV collapse on 2D but no variation in mitral inflow Suggest clinical correltation and repeat TTE in 2 weeks . Moderate pericardial effusion. The pericardial effusion is posterior to the left ventricle, lateral to the left ventricle and anterior to the right ventricle.  4. The mitral valve is abnormal. Mild mitral valve regurgitation. No evidence of mitral stenosis.  5. The aortic valve is tricuspid. There is mild calcification of the aortic valve. There is mild thickening of the aortic valve. Aortic valve regurgitation is mild to moderate. Aortic valve sclerosis is present, with no evidence of aortic valve stenosis.  6. The inferior vena cava is dilated in size with >50% respiratory variability, suggesting right atrial pressure of 8 mmHg. FINDINGS  Left Ventricle: Left ventricular ejection fraction, by estimation, is 60 to 65%. The left ventricle has normal function. The left ventricle has no regional wall motion abnormalities. The left ventricular internal  cavity size was normal in size. There is  no left ventricular hypertrophy. Left ventricular diastolic parameters were normal.  Right Ventricle: The right ventricular size is normal. No increase in right ventricular wall thickness. Right ventricular systolic function is normal. Left Atrium: Left atrial size was normal in size. Right Atrium: Right atrial size was normal in size. Pericardium: Moderate but not quite circumferential effusion with dilated IVC Mild diastolic RV collapse on 2D but no variation in mitral inflow Suggest clinical correltation and repeat TTE in 2 weeks. A moderately sized pericardial effusion is present. The pericardial effusion is posterior to the left ventricle, lateral to the left ventricle and anterior to the right ventricle. Mitral Valve: The mitral valve is abnormal. There is mild thickening of the mitral valve leaflet(s). There is mild calcification of the mitral valve leaflet(s). Mild mitral valve regurgitation. No evidence of mitral valve stenosis. Tricuspid Valve: The tricuspid valve is normal in structure. Tricuspid valve regurgitation is trivial. No evidence of tricuspid stenosis. Aortic Valve: The aortic valve is tricuspid. There is mild calcification of the aortic valve. There is mild thickening of the aortic valve. Aortic valve regurgitation is mild to moderate. Aortic regurgitation PHT measures 466 msec. Aortic valve sclerosis  is present, with no evidence of aortic valve stenosis. Aortic valve mean gradient measures 4.7 mmHg. Aortic valve peak gradient measures 8.9 mmHg. Aortic valve area, by VTI measures 2.33 cm. Pulmonic Valve: The pulmonic valve was normal in structure. Pulmonic valve regurgitation is not visualized. No evidence of pulmonic stenosis. Aorta: The aortic root is normal in size and structure. Venous: The inferior vena cava is dilated in size with greater than 50% respiratory variability, suggesting right atrial pressure of 8 mmHg. IAS/Shunts: No atrial level  shunt detected by color flow Doppler.  LEFT VENTRICLE PLAX 2D LVIDd:         4.70 cm   Diastology LVIDs:         3.20 cm   LV e' medial:    7.72 cm/s LV PW:         0.90 cm   LV E/e' medial:  10.0 LV IVS:        0.80 cm   LV e' lateral:   9.68 cm/s LVOT diam:     1.90 cm   LV E/e' lateral: 8.0 LV SV:         64 LV SV Index:   42 LVOT Area:     2.84 cm  RIGHT VENTRICLE             IVC RV Basal diam:  3.10 cm     IVC diam: 2.10 cm RV S prime:     13.10 cm/s TAPSE (M-mode): 1.9 cm LEFT ATRIUM             Index        RIGHT ATRIUM           Index LA diam:        3.70 cm 2.43 cm/m   RA Area:     15.00 cm LA Vol (A2C):   47.6 ml 31.24 ml/m  RA Volume:   37.00 ml  24.28 ml/m LA Vol (A4C):   51.1 ml 33.53 ml/m LA Biplane Vol: 50.2 ml 32.94 ml/m  AORTIC VALVE AV Area (Vmax):    2.53 cm AV Area (Vmean):   2.18 cm AV Area (VTI):     2.33 cm AV Vmax:           149.33 cm/s AV Vmean:          98.633 cm/s AV VTI:  0.274 m AV Peak Grad:      8.9 mmHg AV Mean Grad:      4.7 mmHg LVOT Vmax:         133.00 cm/s LVOT Vmean:        75.800 cm/s LVOT VTI:          0.225 m LVOT/AV VTI ratio: 0.82 AI PHT:            466 msec  AORTA Ao Root diam: 3.00 cm Ao Asc diam:  3.10 cm MITRAL VALVE MV Area (PHT): 4.40 cm    SHUNTS MV Decel Time: 173 msec    Systemic VTI:  0.22 m MR Peak grad: 103.0 mmHg   Systemic Diam: 1.90 cm MR Mean grad: 68.0 mmHg MR Vmax:      507.50 cm/s MR Vmean:     385.0 cm/s MV E velocity: 77.50 cm/s MV A velocity: 60.30 cm/s MV E/A ratio:  1.29 Jenkins Rouge MD Electronically signed by Jenkins Rouge MD Signature Date/Time: 03/07/2022/11:47:30 AM    Final    CT HEAD WO CONTRAST (5MM)  Result Date: 03/07/2022 CLINICAL DATA:  Status post right M1 thrombectomy EXAM: CT HEAD WITHOUT CONTRAST TECHNIQUE: Contiguous axial images were obtained from the base of the skull through the vertex without intravenous contrast. RADIATION DOSE REDUCTION: This exam was performed according to the departmental  dose-optimization program which includes automated exposure control, adjustment of the mA and/or kV according to patient size and/or use of iterative reconstruction technique. COMPARISON:  03/07/2022 interventional CT, 03/02/2022 CT head and CTA head neck FINDINGS: Brain: Redemonstrated hypodensity in the bilateral frontal lobes, right parietal lobe, and right occipital lobe. Previously noted cerebellar hypodensity is less conspicuous on this exam. Hyperdense material in the right temporal and frontal lobe (series 3, images 14-19), which appears similar to the post thrombectomy CT and most likely represents contrast staining, although superimposed hemorrhage cannot be excluded. No new area of hypodensity. No mass, mass effect, or midline shift. No hydrocephalus. Vascular: No hyperdense vessel. Skull: Normal. Negative for fracture or focal lesion. Sinuses/Orbits: Mucosal thickening and bubbly fluid in the right maxillary sinus. The orbits are unremarkable. Other: The mastoids are well aerated. IMPRESSION: 1. Hyperdense material in the right temporal and frontal lobe, which appears similar to the post thrombectomy CT and most likely represents contrast staining, although superimposed hemorrhage cannot be excluded. 2. Redemonstrated hypodensity in the bilateral frontal lobes, right parietal lobe, and right occipital lobe, consistent with infarcts. Previously noted cerebellar hypodensity is less conspicuous on this exam. Electronically Signed   By: Merilyn Baba M.D.   On: 03/07/2022 02:32   DG Abd 1 View  Result Date: 03/10/2022 CLINICAL DATA:  [id number] Encounter for orogastric (OG) tube placement [id number] EXAM: ABDOMEN - 1 VIEW; PORTABLE CHEST - 1 VIEW COMPARISON:  None Available. FINDINGS: Enteric tube courses below the hemidiaphragm with tip and side port overlying the expected region of the gastric lumen. Enlarged cardiac silhouette with some component likely due to AP portable technique. The heart and mediastinal  contours are within normal limits. Aortic calcification. Consolidative airspace opacity of the left upper lobe. No pulmonary edema. No pleural effusion. No pneumothorax. The bowel gas pattern is normal. Right upper quadrant clips noted. Bowel anastomotic staple sutures noted overlying the pelvis. Excretion of intravenous contrast within bilateral renal pelvis sees and urinary bladder lumen. No radio-opaque calculi or other significant radiographic abnormality are seen. No acute osseous abnormality. IMPRESSION: 1. Consolidative airspace opacity of the left upper lobe. Followup  PA and lateral chest X-ray is recommended in 3-4 weeks following therapy to ensure resolution and exclude underlying malignancy. 2. Nonobstructive bowel gas pattern. 3. Enteric tube in good position. 4.  Aortic Atherosclerosis (ICD10-I70.0). Electronically Signed   By: Tish Frederickson M.D.   On: 28-Mar-2022 18:18   DG CHEST PORT 1 VIEW  Result Date: 03/28/22 CLINICAL DATA:  [id number] Encounter for orogastric (OG) tube placement [id number] EXAM: ABDOMEN - 1 VIEW; PORTABLE CHEST - 1 VIEW COMPARISON:  None Available. FINDINGS: Enteric tube courses below the hemidiaphragm with tip and side port overlying the expected region of the gastric lumen. Enlarged cardiac silhouette with some component likely due to AP portable technique. The heart and mediastinal contours are within normal limits. Aortic calcification. Consolidative airspace opacity of the left upper lobe. No pulmonary edema. No pleural effusion. No pneumothorax. The bowel gas pattern is normal. Right upper quadrant clips noted. Bowel anastomotic staple sutures noted overlying the pelvis. Excretion of intravenous contrast within bilateral renal pelvis sees and urinary bladder lumen. No radio-opaque calculi or other significant radiographic abnormality are seen. No acute osseous abnormality. IMPRESSION: 1. Consolidative airspace opacity of the left upper lobe. Followup PA and lateral chest  X-ray is recommended in 3-4 weeks following therapy to ensure resolution and exclude underlying malignancy. 2. Nonobstructive bowel gas pattern. 3. Enteric tube in good position. 4.  Aortic Atherosclerosis (ICD10-I70.0). Electronically Signed   By: Tish Frederickson M.D.   On: 28-Mar-2022 18:18   CT HEAD CODE STROKE WO CONTRAST  Addendum Date: 28-Mar-2022   ADDENDUM REPORT: 03/28/22 15:28 ADDENDUM: Findings discussed with Dr. Derry Lory via telephone at 3:20 p.m. Electronically Signed   By: Feliberto Harts M.D.   On: 2022-03-28 15:28   Result Date: 28-Mar-2022 CLINICAL DATA:  Code stroke.  Neuro deficit, acute, stroke suspected EXAM: CT HEAD WITHOUT CONTRAST TECHNIQUE: Contiguous axial images were obtained from the base of the skull through the vertex without intravenous contrast. RADIATION DOSE REDUCTION: This exam was performed according to the departmental dose-optimization program which includes automated exposure control, adjustment of the mA and/or kV according to patient size and/or use of iterative reconstruction technique. COMPARISON:  None Available. FINDINGS: Brain: Hypoattenuation loss of gray differentiation in bilateral frontal lobes and right parietal and occipital lobes, compatible with acute infarcts. Also, small areas of hypoattenuation in bilateral cerebellum, suggestive of acute infarcts. No evidence of acute hemorrhage, mass lesion, midline shift or hydrocephalus. Vascular: No hyperdense vessel identified. Skull: No acute fracture. Sinuses/Orbits: Partially imaged right maxillary sinus mucosal thickening with air-fluid level. No acute orbital findings. Other: No mastoid effusions. IMPRESSION: 1. Findings concerning for acute infarcts in bilateral frontal lobes, right parietal and occipital lobes and bilateral cerebellum. Given involvement of multiple vascular territories, consider embolic etiology. Also, recommend MRI to further characterize. 2. No acute hemorrhage. Electronically Signed:  By: Feliberto Harts M.D. On: 28-Mar-2022 15:11   CT ANGIO HEAD NECK W WO CM (CODE STROKE)  Result Date: 2022/03/28 CLINICAL DATA:  Neuro deficit, acute, stroke suspected EXAM: CT ANGIOGRAPHY HEAD AND NECK TECHNIQUE: Multidetector CT imaging of the head and neck was performed using the standard protocol during bolus administration of intravenous contrast. Multiplanar CT image reconstructions and MIPs were obtained to evaluate the vascular anatomy. Carotid stenosis measurements (when applicable) are obtained utilizing NASCET criteria, using the distal internal carotid diameter as the denominator. RADIATION DOSE REDUCTION: This exam was performed according to the departmental dose-optimization program which includes automated exposure control, adjustment of the mA and/or kV according to  patient size and/or use of iterative reconstruction technique. COMPARISON:  None Available. FINDINGS: CTA NECK FINDINGS Aortic arch: Great vessel origins are patent. Right carotid system: No evidence of dissection, stenosis (50% or greater), or occlusion. Left carotid system: No evidence of dissection, stenosis (50% or greater), or occlusion. Vertebral arteries: Right dominant. No evidence of dissection, stenosis (50% or greater), or occlusion. Skeleton: Multilevel degenerative change.  No acute findings. Other neck: No acute findings. Upper chest: Extensive consolidation in the left upper lobe. Review of the MIP images confirms the above findings CTA HEAD FINDINGS Anterior circulation: Bilateral intracranial ICAs are patent with mild calcific atherosclerosis. Occlusion of the distal right M1 MCA with some opacification of more distal right MCA branches. Left MCA and bilateral ACAs are patent. Posterior circulation: Bilateral intradural vertebral arteries, basilar artery and bilateral posterior cerebral arteries are patent without proximal high-grade stenosis. Venous sinuses: As permitted by contrast timing, patent. Review of the  MIP images confirms the above findings IMPRESSION: 1. Distal right M1 MCA occlusion. 2. Extensive consolidation in the left upper lobe, suspicious for pneumonia and/or aspiration. Findings discussed with Dr. Lorrin Goodell via telephone at 3:20 p.m. Electronically Signed   By: Margaretha Sheffield M.D.   On: 03/01/2022 15:22     Assessment and Plan:   #Atrial fibrillation #SVT -Of note patient was not compliant with her eliquis and on sotalol? prior to admission -it appears patient went into intermittent episodes of SVT (appears to be AT), Afib with RVR and sinus rhythm throughout last night, and patient's heart rate is very labile  -eliquis on hold in the setting of SAH and large infarct, no rhythm control at this point -optimize electrolytes -continue diltiazem and titrate as need to keep target HR <120-130 given she is acute illness. Can add metoprolol tartrate for further rate control (LVEF 60-65%) and titrate as needed -If requiring more pressor support by adding CCB or BB, can consider add digoxin or challenge patient with amiodarone for rate control. Per chart review, patient is allergic to amiodarone due to "palpitations" (check TSH before start it) -we will continue to follow   Risk Assessment/Risk Scores:   For questions or updates, please contact [place] Please consult [email or web address] for contact info under    Signed, Laurice Record, MD  03/08/2022 4:27 AM

## 2022-03-08 NOTE — Progress Notes (Signed)
eLink Physician-Brief Progress Note Patient Name: Sherry Olson DOB: 14-Jan-1946 MRN: [id number]   Date of Service  03/08/2022  HPI/Events of Note  Patient with paroxysmal atrial fib-flutter with RVR alternating with sinus rhythm in in the 90's, (short cycles) despite Cardizem gtt at 20 mg  / hour, she has a listed allergy to Amiodarone. BP 103/53. Patient is s/p recent CVA.  eICU Interventions  Cardiology consultation requested.        Sherry Olson Sherry Olson 03/08/2022, 2:04 AM

## 2022-03-08 NOTE — Progress Notes (Addendum)
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 s/p revascaulation of occluded right middle cerebral artery distal M1 segment with 2 passes of contact aspiration, and 1 pass with a 3 mm x 20 mm solitaire X retrieval device and contact aspiration achieving a TICI 2C revascularization with Dr. Julieanne Cotton   Subjective:  Unable to assess. Patient remains intubated on the vent.   Allergies: Amiodarone, Colesevelam, and Welchol [colesevelam hcl]  Medications: Prior to Admission medications   Medication Sig Start Date End Date Taking? Authorizing Provider  colchicine 0.6 MG tablet Take 0.6 mg by mouth daily. 02/21/22  Yes [provider]  doxycycline (VIBRA-TABS) 100 MG tablet Take 100 mg by mouth 2 (two) times daily. 02/28/22  Yes [provider]  ELIQUIS 5 MG TABS tablet Take 5 mg by mouth 2 (two) times daily. 02/04/22  Yes [provider]  indomethacin (INDOCIN) 25 MG capsule Take 25 mg by mouth 2 (two) times daily. 02/21/22  Yes [provider]  levofloxacin (LEVAQUIN) 500 MG tablet Take 500 mg by mouth daily. 01/24/22  Yes [provider]  metoprolol tartrate (LOPRESSOR) 100 MG tablet Take 100 mg by mouth 2 (two) times daily. 02/10/22  Yes [provider]  montelukast (SINGULAIR) 10 MG tablet Take 10 mg by mouth daily. 01/18/22  Yes [provider]  sotalol (BETAPACE) 80 MG tablet Take 80 mg by mouth 2 (two) times daily. 02/04/22  Yes [provider]  traMADol (ULTRAM) 50 MG tablet Take 50 mg by mouth 2 (two) times daily as needed. 01/21/22  Yes [provider]  amLODipine (NORVASC) 5 MG tablet Take 5 mg by mouth daily.    [provider]  aspirin 81 MG chewable tablet Chew by mouth daily.    [provider]   atorvastatin (LIPITOR) 10 MG tablet Take 10 mg by mouth daily.    [provider]  benazepril (LOTENSIN) 20 MG tablet Take 20 mg by mouth daily.    [provider]  Calcium Carbonate (CALCIUM 600 PO) Take by mouth daily.    [provider]  cholecalciferol (VITAMIN D) 1000 units tablet Take 1,000 Units by mouth daily.    [provider]  CITALOPRAM HYDROBROMIDE PO Take by mouth.    [provider]  dicyclomine (BENTYL) 10 MG capsule Take 10 mg by mouth 4 (four) times daily -  before meals and at bedtime. Patient not taking: Reported on 02/26/2021    [provider]  FLUTICASONE PROPIONATE EX Apply topically. Patient not taking: Reported on 02/26/2021    [provider]  furosemide (LASIX) 20 MG tablet Take 20 mg by mouth daily. Patient not taking: Reported on 02/26/2021    [provider]  LORazepam (ATIVAN) 0.5 MG tablet Take 0.5 mg by mouth as needed for anxiety.    [provider]  METOPROLOL SUCCINATE PO Take by mouth.    [provider]  omeprazole (PRILOSEC) 40 MG capsule Take 40 mg by mouth daily.    [provider]  ondansetron (ZOFRAN-ODT) 4 MG disintegrating tablet Take 1 tablet (4 mg total) by mouth every 8 (eight) hours as needed for nausea or vomiting. 02/28/22   Corena Herter, MD     Vital Signs: BP (!) 151/87   Pulse 90   Temp 99 F (37.2 C) (Axillary)  Resp (!) 26   Ht 5\' 3"  (1.6 m)   Wt 114 lb 3.2 oz (51.8 kg)   SpO2 92%   BMI 20.23 kg/m   Physical Exam Cardiovascular:     Rate and Rhythm: Tachycardia present. Rhythm irregular.     Comments: Right groin  access site is soft with no active bleeding and no appreciable pseudoaneurysm. Dressing is C/D/I  Patient is in a fib with rvr.    Skin:    General: Skin is warm.  Neurological:     Comments: Alert, able to open 1 eye to verbal stimuli.  Can spontaneously move all RUE and wiggle toes on RLE.         Imaging: IR PERCUTANEOUS ART THROMBECTOMY/INFUSION INTRACRANIAL INC DIAG ANGIO  Result Date: 03/08/2022 INDICATION: New onset right gaze deviation, left sided neglect, and left hemiparesis. Occluded right middle cerebral artery CT angiogram of the head and neck. EXAM: 1. EMERGENT LARGE VESSEL OCCLUSION THROMBOLYSIS (anterior CIRCULATION) COMPARISON:  CT angiogram of the head and neck of March 06, 2022. MEDICATIONS: Ancef 2 g IV antibiotic was administered within 1 hour of the procedure. ANESTHESIA/SEDATION: General anesthesia. CONTRAST:  Omnipaque 300 approximately 95 mL. FLUOROSCOPY TIME:  Fluoroscopy Time: 28 minutes 30 seconds (1106 mGy). COMPLICATIONS: None immediate. TECHNIQUE: Following a full explanation of the procedure along with the potential associated complications, an informed witnessed consent was obtained. The risks of intracranial hemorrhage of 10%, worsening neurological deficit, ventilator dependency, death and inability to revascularize were all reviewed in detail with the patient's husband. The patient was then put under general anesthesia by the Department of Anesthesiology at [facility]. The right groin was prepped and draped in the usual sterile fashion. Thereafter using modified Seldinger technique, transfemoral access into the right common femoral artery was obtained without difficulty. Over a 0.035 inch guidewire an 8 French 15 cm Pinnacle sheath was inserted. Through this, and also over a 0.035 inch guidewire a combination of the 5.5 [place] 125 cm Berenstein support catheter inside of an 087 95 cm balloon guide catheter was advanced over a 5 [place] JB 1 catheter was advanced and positioned initially in the innominate artery and then distal right internal carotid artery. Arteriograms were obtained of the right vertebral artery and the left vertebral artery at the termination of the procedure. FINDINGS: Innominate arteriogram demonstrates the right common carotid  artery and the right subclavian artery to be widely patent. The right common carotid arteriogram demonstrates the right external carotid artery and its major branches to be widely patent. The right internal carotid artery at the bulb to the cranial skull base is widely patent. The petrous, the cavernous and the supraclinoid ICA demonstrate wide patency. Complete occlusion of the right middle cerebral artery and its distal M1 segment is noted. The right anterior cerebral artery opacifies into the capillary and venous phases with transient cross-filling of the left anterior cerebral artery A2 segment. PROCEDURE: Through the balloon guide catheter in the distal right internal carotid artery, an over an 014 inch standard micro guidewire with a moderate J configuration, an 021 160 microcatheter inside of an 071 Zoom aspiration catheter was advanced to the supraclinoid right ICA. The micro guidewire was then gently manipulated using a torque device and advanced through the occluded right middle cerebral artery inferior division M1 M2 region followed by the microcatheter followed by the 071 Zoom aspiration catheter which was buried into the occluded right middle cerebral artery just distal to its occlusion. The micro guidewire, and the  microcatheter were retrieved and removed. Contact aspiration was then applied at hub of the 071 Zoom aspiration catheter, and also at the hub of the balloon guide catheter with proximal flow arrest for approximately 2 minutes. Thereafter, the Zoom aspiration catheter was retrieved and removed. Following reversal of flow arrest, a control arteriogram performed through the balloon guide catheter now demonstrated revascularization of the occluded right middle cerebral artery, and also of the inferior division M2 segment. The superior division continued to demonstrate a stump at its origin. A second pass was then made using the 055 132 Zoom aspiration catheter advanced in combination with a 160  cm 021 microcatheter over a J-tip micro guidewire to the right M1 segment. Using a torque device, the micro guidewire was advanced through the occluded prominent inferior division into the M2 M3 region followed by the Zoom aspiration catheter. The microcatheter and the micro guidewire were removed. Constant aspiration was then applied at the hub of the Zoom aspiration catheter in the M2 M3 segment of the inferior division for approximately a minute. With proximal flow arrest in the right internal carotid artery, the Zoom aspiration catheter was removed. A control arteriogram performed following reversal of flow arrest now demonstrated near complete revascularization of the inferior division with a distal M3 M4 branch demonstrating spasm which responded to 3 aliquots of 25 mcg of nitroglycerin. A third pass was then made using again an 055 Zoom aspiration catheter with the microcatheter advanced again into the occluded superior division. The micro guidewire and the microcatheter were advanced distally to the M2 M3 region. The guidewire was removed. Good aspiration obtained from the hub of the microcatheter. A gentle control arteriogram performed through the microcatheter demonstrated safe positioning of tip of the microcatheter. A 3 mm x 20 mm Solitaire X retrieval device was then deployed in the superior division in the usual manner. The 055 Zoom aspiration advanced just inside the occluded superior division. Thereafter, constant aspiration was then applied at the hub of the Zoom aspiration catheter, and also at the hub of the balloon guide catheter for approximately 30 seconds. The combination was then retrieved and removed. Performed following reversal of flow arrest demonstrated now revascularization of the superior division in its entirety. A TICI 2C revascularization was achieved. Distal occlusion was evident at temporal branch in the distal M3 M4 region, and also the inferior division in the distal M3/M4  region. A control arteriogram performed through the balloon guide catheter in the right common carotid artery demonstrated patency of the extra cranially and intracranially internal carotid artery with continued maintenance of a TICI 2C revascularization. There continued to be an area of attenuation in the distal parietal cortical region in keeping with a hypoattenuation noted on a CT scan of the brain. The balloon guide catheter was removed. Diagnostic arteriograms were then performed of both vertebral arteries selectively. These demonstrated wide patency of both the vertebral arteries to the cranial skull base. Patency was noted of the basilar artery, the posterior cerebral arteries, the superior cerebellar arteries and the anterior-inferior cerebellar arteries into the capillary and venous phases. The 8 French sheath was removed. Hemostasis was achieved at the right groin puncture site with an 8 French Angio-Seal closure device. Distal pulses remained Dopplerable in both feet unchanged prior to the procedure. A flat panel CT of the brain demonstrated mild-to-moderate hyperattenuation in the subarachnoid space in the right sylvian fissure which extended to a lesser extent to the right parietal in the temporal regions. No intra parenchymal hyperattenuation  was evident. The patient was left intubated because of her medical condition. She was then transferred to the neuro ICU for post revascularization care. IMPRESSION: Status post endovascular revascularization of occluded right middle cerebral artery M1 segment with 1 pass with an 071 contact aspiration device, and 1 pass with a 055 Zoom contact aspiration, and 1 pass with a 3 mm x 20 Solitaire X retrieval device and contact aspiration achieving a TICI 2C revascularization. PLAN: As per stroke service. Electronically Signed   By: Luanne Bras M.D.   On: 03/08/2022 08:17   IR CT Head Ltd  Result Date: 03/08/2022 INDICATION: New onset right gaze deviation,  left sided neglect, and left hemiparesis. Occluded right middle cerebral artery CT angiogram of the head and neck. EXAM: 1. EMERGENT LARGE VESSEL OCCLUSION THROMBOLYSIS (anterior CIRCULATION) COMPARISON:  CT angiogram of the head and neck of March 06, 2022. MEDICATIONS: Ancef 2 g IV antibiotic was administered within 1 hour of the procedure. ANESTHESIA/SEDATION: General anesthesia. CONTRAST:  Omnipaque 300 approximately 95 mL. FLUOROSCOPY TIME:  Fluoroscopy Time: 28 minutes 30 seconds (1106 mGy). COMPLICATIONS: None immediate. TECHNIQUE: Following a full explanation of the procedure along with the potential associated complications, an informed witnessed consent was obtained. The risks of intracranial hemorrhage of 10%, worsening neurological deficit, ventilator dependency, death and inability to revascularize were all reviewed in detail with the patient's husband. The patient was then put under general anesthesia by the Department of Anesthesiology at [facility]. The right groin was prepped and draped in the usual sterile fashion. Thereafter using modified Seldinger technique, transfemoral access into the right common femoral artery was obtained without difficulty. Over a 0.035 inch guidewire an 8 French 15 cm Pinnacle sheath was inserted. Through this, and also over a 0.035 inch guidewire a combination of the 5.5 [place] 125 cm Berenstein support catheter inside of an 087 95 cm balloon guide catheter was advanced over a 5 [place] JB 1 catheter was advanced and positioned initially in the innominate artery and then distal right internal carotid artery. Arteriograms were obtained of the right vertebral artery and the left vertebral artery at the termination of the procedure. FINDINGS: Innominate arteriogram demonstrates the right common carotid artery and the right subclavian artery to be widely patent. The right common carotid arteriogram demonstrates the right external carotid artery and its major  branches to be widely patent. The right internal carotid artery at the bulb to the cranial skull base is widely patent. The petrous, the cavernous and the supraclinoid ICA demonstrate wide patency. Complete occlusion of the right middle cerebral artery and its distal M1 segment is noted. The right anterior cerebral artery opacifies into the capillary and venous phases with transient cross-filling of the left anterior cerebral artery A2 segment. PROCEDURE: Through the balloon guide catheter in the distal right internal carotid artery, an over an 014 inch standard micro guidewire with a moderate J configuration, an 021 160 microcatheter inside of an 071 Zoom aspiration catheter was advanced to the supraclinoid right ICA. The micro guidewire was then gently manipulated using a torque device and advanced through the occluded right middle cerebral artery inferior division M1 M2 region followed by the microcatheter followed by the 071 Zoom aspiration catheter which was buried into the occluded right middle cerebral artery just distal to its occlusion. The micro guidewire, and the microcatheter were retrieved and removed. Contact aspiration was then applied at hub of the 071 Zoom aspiration catheter, and also at the hub of the balloon guide catheter  with proximal flow arrest for approximately 2 minutes. Thereafter, the Zoom aspiration catheter was retrieved and removed. Following reversal of flow arrest, a control arteriogram performed through the balloon guide catheter now demonstrated revascularization of the occluded right middle cerebral artery, and also of the inferior division M2 segment. The superior division continued to demonstrate a stump at its origin. A second pass was then made using the 055 132 Zoom aspiration catheter advanced in combination with a 160 cm 021 microcatheter over a J-tip micro guidewire to the right M1 segment. Using a torque device, the micro guidewire was advanced through the occluded  prominent inferior division into the M2 M3 region followed by the Zoom aspiration catheter. The microcatheter and the micro guidewire were removed. Constant aspiration was then applied at the hub of the Zoom aspiration catheter in the M2 M3 segment of the inferior division for approximately a minute. With proximal flow arrest in the right internal carotid artery, the Zoom aspiration catheter was removed. A control arteriogram performed following reversal of flow arrest now demonstrated near complete revascularization of the inferior division with a distal M3 M4 branch demonstrating spasm which responded to 3 aliquots of 25 mcg of nitroglycerin. A third pass was then made using again an 055 Zoom aspiration catheter with the microcatheter advanced again into the occluded superior division. The micro guidewire and the microcatheter were advanced distally to the M2 M3 region. The guidewire was removed. Good aspiration obtained from the hub of the microcatheter. A gentle control arteriogram performed through the microcatheter demonstrated safe positioning of tip of the microcatheter. A 3 mm x 20 mm Solitaire X retrieval device was then deployed in the superior division in the usual manner. The 055 Zoom aspiration advanced just inside the occluded superior division. Thereafter, constant aspiration was then applied at the hub of the Zoom aspiration catheter, and also at the hub of the balloon guide catheter for approximately 30 seconds. The combination was then retrieved and removed. Performed following reversal of flow arrest demonstrated now revascularization of the superior division in its entirety. A TICI 2C revascularization was achieved. Distal occlusion was evident at temporal branch in the distal M3 M4 region, and also the inferior division in the distal M3/M4 region. A control arteriogram performed through the balloon guide catheter in the right common carotid artery demonstrated patency of the extra cranially and  intracranially internal carotid artery with continued maintenance of a TICI 2C revascularization. There continued to be an area of attenuation in the distal parietal cortical region in keeping with a hypoattenuation noted on a CT scan of the brain. The balloon guide catheter was removed. Diagnostic arteriograms were then performed of both vertebral arteries selectively. These demonstrated wide patency of both the vertebral arteries to the cranial skull base. Patency was noted of the basilar artery, the posterior cerebral arteries, the superior cerebellar arteries and the anterior-inferior cerebellar arteries into the capillary and venous phases. The 8 French sheath was removed. Hemostasis was achieved at the right groin puncture site with an 8 French Angio-Seal closure device. Distal pulses remained Dopplerable in both feet unchanged prior to the procedure. A flat panel CT of the brain demonstrated mild-to-moderate hyperattenuation in the subarachnoid space in the right sylvian fissure which extended to a lesser extent to the right parietal in the temporal regions. No intra parenchymal hyperattenuation was evident. The patient was left intubated because of her medical condition. She was then transferred to the neuro ICU for post revascularization care. IMPRESSION: Status post endovascular  revascularization of occluded right middle cerebral artery M1 segment with 1 pass with an 071 contact aspiration device, and 1 pass with a 055 Zoom contact aspiration, and 1 pass with a 3 mm x 20 Solitaire X retrieval device and contact aspiration achieving a TICI 2C revascularization. PLAN: As per stroke service. Electronically Signed   By: Julieanne Cotton M.D.   On: 03/08/2022 08:17   IR ANGIO VERTEBRAL SEL VERTEBRAL UNI R MOD SED  Result Date: 03/08/2022 INDICATION: New onset right gaze deviation, left sided neglect, and left hemiparesis. Occluded right middle cerebral artery CT angiogram of the head and neck. EXAM: 1.  EMERGENT LARGE VESSEL OCCLUSION THROMBOLYSIS (anterior CIRCULATION) COMPARISON:  CT angiogram of the head and neck of 03-27-22. MEDICATIONS: Ancef 2 g IV antibiotic was administered within 1 hour of the procedure. ANESTHESIA/SEDATION: General anesthesia. CONTRAST:  Omnipaque 300 approximately 95 mL. FLUOROSCOPY TIME:  Fluoroscopy Time: 28 minutes 30 seconds (1106 mGy). COMPLICATIONS: None immediate. TECHNIQUE: Following a full explanation of the procedure along with the potential associated complications, an informed witnessed consent was obtained. The risks of intracranial hemorrhage of 10%, worsening neurological deficit, ventilator dependency, death and inability to revascularize were all reviewed in detail with the patient's husband. The patient was then put under general anesthesia by the Department of Anesthesiology at [facility]. The right groin was prepped and draped in the usual sterile fashion. Thereafter using modified Seldinger technique, transfemoral access into the right common femoral artery was obtained without difficulty. Over a 0.035 inch guidewire an 8 French 15 cm Pinnacle sheath was inserted. Through this, and also over a 0.035 inch guidewire a combination of the 5.5 [place] 125 cm Berenstein support catheter inside of an 087 95 cm balloon guide catheter was advanced over a 5 [place] JB 1 catheter was advanced and positioned initially in the innominate artery and then distal right internal carotid artery. Arteriograms were obtained of the right vertebral artery and the left vertebral artery at the termination of the procedure. FINDINGS: Innominate arteriogram demonstrates the right common carotid artery and the right subclavian artery to be widely patent. The right common carotid arteriogram demonstrates the right external carotid artery and its major branches to be widely patent. The right internal carotid artery at the bulb to the cranial skull base is widely patent. The  petrous, the cavernous and the supraclinoid ICA demonstrate wide patency. Complete occlusion of the right middle cerebral artery and its distal M1 segment is noted. The right anterior cerebral artery opacifies into the capillary and venous phases with transient cross-filling of the left anterior cerebral artery A2 segment. PROCEDURE: Through the balloon guide catheter in the distal right internal carotid artery, an over an 014 inch standard micro guidewire with a moderate J configuration, an 021 160 microcatheter inside of an 071 Zoom aspiration catheter was advanced to the supraclinoid right ICA. The micro guidewire was then gently manipulated using a torque device and advanced through the occluded right middle cerebral artery inferior division M1 M2 region followed by the microcatheter followed by the 071 Zoom aspiration catheter which was buried into the occluded right middle cerebral artery just distal to its occlusion. The micro guidewire, and the microcatheter were retrieved and removed. Contact aspiration was then applied at hub of the 071 Zoom aspiration catheter, and also at the hub of the balloon guide catheter with proximal flow arrest for approximately 2 minutes. Thereafter, the Zoom aspiration catheter was retrieved and removed. Following reversal of flow arrest, a  control arteriogram performed through the balloon guide catheter now demonstrated revascularization of the occluded right middle cerebral artery, and also of the inferior division M2 segment. The superior division continued to demonstrate a stump at its origin. A second pass was then made using the 055 132 Zoom aspiration catheter advanced in combination with a 160 cm 021 microcatheter over a J-tip micro guidewire to the right M1 segment. Using a torque device, the micro guidewire was advanced through the occluded prominent inferior division into the M2 M3 region followed by the Zoom aspiration catheter. The microcatheter and the micro  guidewire were removed. Constant aspiration was then applied at the hub of the Zoom aspiration catheter in the M2 M3 segment of the inferior division for approximately a minute. With proximal flow arrest in the right internal carotid artery, the Zoom aspiration catheter was removed. A control arteriogram performed following reversal of flow arrest now demonstrated near complete revascularization of the inferior division with a distal M3 M4 branch demonstrating spasm which responded to 3 aliquots of 25 mcg of nitroglycerin. A third pass was then made using again an 055 Zoom aspiration catheter with the microcatheter advanced again into the occluded superior division. The micro guidewire and the microcatheter were advanced distally to the M2 M3 region. The guidewire was removed. Good aspiration obtained from the hub of the microcatheter. A gentle control arteriogram performed through the microcatheter demonstrated safe positioning of tip of the microcatheter. A 3 mm x 20 mm Solitaire X retrieval device was then deployed in the superior division in the usual manner. The 055 Zoom aspiration advanced just inside the occluded superior division. Thereafter, constant aspiration was then applied at the hub of the Zoom aspiration catheter, and also at the hub of the balloon guide catheter for approximately 30 seconds. The combination was then retrieved and removed. Performed following reversal of flow arrest demonstrated now revascularization of the superior division in its entirety. A TICI 2C revascularization was achieved. Distal occlusion was evident at temporal branch in the distal M3 M4 region, and also the inferior division in the distal M3/M4 region. A control arteriogram performed through the balloon guide catheter in the right common carotid artery demonstrated patency of the extra cranially and intracranially internal carotid artery with continued maintenance of a TICI 2C revascularization. There continued to be an  area of attenuation in the distal parietal cortical region in keeping with a hypoattenuation noted on a CT scan of the brain. The balloon guide catheter was removed. Diagnostic arteriograms were then performed of both vertebral arteries selectively. These demonstrated wide patency of both the vertebral arteries to the cranial skull base. Patency was noted of the basilar artery, the posterior cerebral arteries, the superior cerebellar arteries and the anterior-inferior cerebellar arteries into the capillary and venous phases. The 8 French sheath was removed. Hemostasis was achieved at the right groin puncture site with an 8 French Angio-Seal closure device. Distal pulses remained Dopplerable in both feet unchanged prior to the procedure. A flat panel CT of the brain demonstrated mild-to-moderate hyperattenuation in the subarachnoid space in the right sylvian fissure which extended to a lesser extent to the right parietal in the temporal regions. No intra parenchymal hyperattenuation was evident. The patient was left intubated because of her medical condition. She was then transferred to the neuro ICU for post revascularization care. IMPRESSION: Status post endovascular revascularization of occluded right middle cerebral artery M1 segment with 1 pass with an 071 contact aspiration device, and 1 pass with a  055 Zoom contact aspiration, and 1 pass with a 3 mm x 20 Solitaire X retrieval device and contact aspiration achieving a TICI 2C revascularization. PLAN: As per stroke service. Electronically Signed   By: Luanne Bras M.D.   On: 03/08/2022 08:17   MR ANGIO HEAD WO CONTRAST  Result Date: 03/07/2022 CLINICAL DATA:  Stroke, follow-up. EXAM: MRI HEAD WITHOUT CONTRAST MRA HEAD WITHOUT CONTRAST TECHNIQUE: Multiplanar, multi-echo pulse sequences of the brain and surrounding structures were acquired without intravenous contrast. Angiographic images of the Circle of Willis were acquired using MRA technique without  intravenous contrast. COMPARISON:  Head CT March 07, 2022. FINDINGS: MRI HEAD FINDINGS Brain: Scattered and confluent areas of restricted diffusion are seen in the bilateral cerebral and cerebellar hemispheres with the largest confluent areas involving the right temporoparietal occipital region and bilateral frontal lobes. Susceptibility artifact is noted in the sulci of the right is temporoparietal occipital region, bilateral occipital horns and along the tentorium, with distribution similar to prior CT, likely representing subarachnoid hemorrhage. No significant mass effect, intraparenchymal hemorrhage, hydrocephalus or mass lesion. Vascular: Normal flow voids. Skull and upper cervical spine: Normal marrow signal. Sinuses/Orbits: Mucosal thickening and bubbly secretion within the right maxillary sinus. Right mastoid effusion. The orbits are maintained. Other: None. MRA HEAD FINDINGS Anterior circulation: The visualized portions of the distal cervical and intracranial internal carotid arteries are widely patent with normal flow related enhancement. The bilateral anterior cerebral arteries and middle cerebral arteries are widely patent with anterograde flow without high-grade flow-limiting stenosis or proximal branch occlusion. No intracranial aneurysm within the anterior circulation. Posterior circulation: The vertebral arteries are widely patent with anterograde flow. Vertebrobasilar junction and basilar artery are widely patent with anterograde flow without evidence of basilar stenosis or aneurysm. Posterior cerebral arteries are normal bilaterally. No intracranial aneurysm within the posterior circulation. Anatomic variants: None significant. IMPRESSION: 1. Scattered and confluent areas of restricted diffusion in the bilateral cerebral and cerebellar hemispheres, consistent with acute infarcts, likely embolic. 2. Small amount of subarachnoid hemorrhage in the right temporoparietal occipital region, bilateral  occipital horns and along the tentorium. 3. No evidence of high-grade flow-limiting stenosis or proximal branch occlusion in the intracranial circulation. Electronically Signed   By: Pedro Earls M.D.   On: 03/07/2022 13:22   MR BRAIN WO CONTRAST  Result Date: 03/07/2022 CLINICAL DATA:  Stroke, follow-up. EXAM: MRI HEAD WITHOUT CONTRAST MRA HEAD WITHOUT CONTRAST TECHNIQUE: Multiplanar, multi-echo pulse sequences of the brain and surrounding structures were acquired without intravenous contrast. Angiographic images of the Circle of Willis were acquired using MRA technique without intravenous contrast. COMPARISON:  Head CT March 07, 2022. FINDINGS: MRI HEAD FINDINGS Brain: Scattered and confluent areas of restricted diffusion are seen in the bilateral cerebral and cerebellar hemispheres with the largest confluent areas involving the right temporoparietal occipital region and bilateral frontal lobes. Susceptibility artifact is noted in the sulci of the right is temporoparietal occipital region, bilateral occipital horns and along the tentorium, with distribution similar to prior CT, likely representing subarachnoid hemorrhage. No significant mass effect, intraparenchymal hemorrhage, hydrocephalus or mass lesion. Vascular: Normal flow voids. Skull and upper cervical spine: Normal marrow signal. Sinuses/Orbits: Mucosal thickening and bubbly secretion within the right maxillary sinus. Right mastoid effusion. The orbits are maintained. Other: None. MRA HEAD FINDINGS Anterior circulation: The visualized portions of the distal cervical and intracranial internal carotid arteries are widely patent with normal flow related enhancement. The bilateral anterior cerebral arteries and middle cerebral arteries are widely patent with  anterograde flow without high-grade flow-limiting stenosis or proximal branch occlusion. No intracranial aneurysm within the anterior circulation. Posterior circulation: The  vertebral arteries are widely patent with anterograde flow. Vertebrobasilar junction and basilar artery are widely patent with anterograde flow without evidence of basilar stenosis or aneurysm. Posterior cerebral arteries are normal bilaterally. No intracranial aneurysm within the posterior circulation. Anatomic variants: None significant. IMPRESSION: 1. Scattered and confluent areas of restricted diffusion in the bilateral cerebral and cerebellar hemispheres, consistent with acute infarcts, likely embolic. 2. Small amount of subarachnoid hemorrhage in the right temporoparietal occipital region, bilateral occipital horns and along the tentorium. 3. No evidence of high-grade flow-limiting stenosis or proximal branch occlusion in the intracranial circulation. Electronically Signed   By: Pedro Earls M.D.   On: 03/07/2022 13:22   ECHOCARDIOGRAM COMPLETE  Result Date: 03/07/2022    ECHOCARDIOGRAM REPORT   Patient Name:   Sherry Olson [facility] [place] Date of Exam: 03/07/2022 Medical Rec #:  [id number]       Height:       63.0 in Accession #:    [id number]      Weight:       114.2 lb Date of Birth:  [birth year]        BSA:          1.524 m Patient Age:    66 years        BP:           121/48 mmHg Patient Gender: F               HR:           69 bpm. Exam Location:  Inpatient Procedure: 2D Echo, Cardiac Doppler and Color Doppler Indications:    Stroke I63.9  History:        Patient has no prior history of Echocardiogram examinations.                 CAD, Stroke; Risk Factors:Hypertension and Dyslipidemia.  Sonographer:    Ronny Flurry Referring Phys: [id number] [place]  1. Left ventricular ejection fraction, by estimation, is 60 to 65%. The left ventricle has normal function. The left ventricle has no regional wall motion abnormalities. Left ventricular diastolic parameters were normal.  2. Right ventricular systolic function is normal. The right ventricular size is normal.  3. Moderate but not  quite circumferential effusion with dilated IVC Mild diastolic RV collapse on 2D but no variation in mitral inflow Suggest clinical correltation and repeat TTE in 2 weeks . Moderate pericardial effusion. The pericardial effusion is posterior to the left ventricle, lateral to the left ventricle and anterior to the right ventricle.  4. The mitral valve is abnormal. Mild mitral valve regurgitation. No evidence of mitral stenosis.  5. The aortic valve is tricuspid. There is mild calcification of the aortic valve. There is mild thickening of the aortic valve. Aortic valve regurgitation is mild to moderate. Aortic valve sclerosis is present, with no evidence of aortic valve stenosis.  6. The inferior vena cava is dilated in size with >50% respiratory variability, suggesting right atrial pressure of 8 mmHg. FINDINGS  Left Ventricle: Left ventricular ejection fraction, by estimation, is 60 to 65%. The left ventricle has normal function. The left ventricle has no regional wall motion abnormalities. The left ventricular internal cavity size was normal in size. There is  no left ventricular hypertrophy. Left ventricular diastolic parameters were normal. Right Ventricle: The right ventricular size is normal. No increase in right  ventricular wall thickness. Right ventricular systolic function is normal. Left Atrium: Left atrial size was normal in size. Right Atrium: Right atrial size was normal in size. Pericardium: Moderate but not quite circumferential effusion with dilated IVC Mild diastolic RV collapse on 2D but no variation in mitral inflow Suggest clinical correltation and repeat TTE in 2 weeks. A moderately sized pericardial effusion is present. The pericardial effusion is posterior to the left ventricle, lateral to the left ventricle and anterior to the right ventricle. Mitral Valve: The mitral valve is abnormal. There is mild thickening of the mitral valve leaflet(s). There is mild calcification of the mitral valve  leaflet(s). Mild mitral valve regurgitation. No evidence of mitral valve stenosis. Tricuspid Valve: The tricuspid valve is normal in structure. Tricuspid valve regurgitation is trivial. No evidence of tricuspid stenosis. Aortic Valve: The aortic valve is tricuspid. There is mild calcification of the aortic valve. There is mild thickening of the aortic valve. Aortic valve regurgitation is mild to moderate. Aortic regurgitation PHT measures 466 msec. Aortic valve sclerosis  is present, with no evidence of aortic valve stenosis. Aortic valve mean gradient measures 4.7 mmHg. Aortic valve peak gradient measures 8.9 mmHg. Aortic valve area, by VTI measures 2.33 cm. Pulmonic Valve: The pulmonic valve was normal in structure. Pulmonic valve regurgitation is not visualized. No evidence of pulmonic stenosis. Aorta: The aortic root is normal in size and structure. Venous: The inferior vena cava is dilated in size with greater than 50% respiratory variability, suggesting right atrial pressure of 8 mmHg. IAS/Shunts: No atrial level shunt detected by color flow Doppler.  LEFT VENTRICLE PLAX 2D LVIDd:         4.70 cm   Diastology LVIDs:         3.20 cm   LV e' medial:    7.72 cm/s LV PW:         0.90 cm   LV E/e' medial:  10.0 LV IVS:        0.80 cm   LV e' lateral:   9.68 cm/s LVOT diam:     1.90 cm   LV E/e' lateral: 8.0 LV SV:         64 LV SV Index:   42 LVOT Area:     2.84 cm  RIGHT VENTRICLE             IVC RV Basal diam:  3.10 cm     IVC diam: 2.10 cm RV S prime:     13.10 cm/s TAPSE (M-mode): 1.9 cm LEFT ATRIUM             Index        RIGHT ATRIUM           Index LA diam:        3.70 cm 2.43 cm/m   RA Area:     15.00 cm LA Vol (A2C):   47.6 ml 31.24 ml/m  RA Volume:   37.00 ml  24.28 ml/m LA Vol (A4C):   51.1 ml 33.53 ml/m LA Biplane Vol: 50.2 ml 32.94 ml/m  AORTIC VALVE AV Area (Vmax):    2.53 cm AV Area (Vmean):   2.18 cm AV Area (VTI):     2.33 cm AV Vmax:           149.33 cm/s AV Vmean:          98.633 cm/s  AV VTI:            0.274 m AV Peak Grad:  8.9 mmHg AV Mean Grad:      4.7 mmHg LVOT Vmax:         133.00 cm/s LVOT Vmean:        75.800 cm/s LVOT VTI:          0.225 m LVOT/AV VTI ratio: 0.82 AI PHT:            466 msec  AORTA Ao Root diam: 3.00 cm Ao Asc diam:  3.10 cm MITRAL VALVE MV Area (PHT): 4.40 cm    SHUNTS MV Decel Time: 173 msec    Systemic VTI:  0.22 m MR Peak grad: 103.0 mmHg   Systemic Diam: 1.90 cm MR Mean grad: 68.0 mmHg MR Vmax:      507.50 cm/s MR Vmean:     385.0 cm/s MV E velocity: 77.50 cm/s MV A velocity: 60.30 cm/s MV E/A ratio:  1.29 Jenkins Rouge MD Electronically signed by Jenkins Rouge MD Signature Date/Time: 03/07/2022/11:47:30 AM    Final    CT HEAD WO CONTRAST (5MM)  Result Date: 03/07/2022 CLINICAL DATA:  Status post right M1 thrombectomy EXAM: CT HEAD WITHOUT CONTRAST TECHNIQUE: Contiguous axial images were obtained from the base of the skull through the vertex without intravenous contrast. RADIATION DOSE REDUCTION: This exam was performed according to the departmental dose-optimization program which includes automated exposure control, adjustment of the mA and/or kV according to patient size and/or use of iterative reconstruction technique. COMPARISON:  03/07/2022 interventional CT, 02/24/2022 CT head and CTA head neck FINDINGS: Brain: Redemonstrated hypodensity in the bilateral frontal lobes, right parietal lobe, and right occipital lobe. Previously noted cerebellar hypodensity is less conspicuous on this exam. Hyperdense material in the right temporal and frontal lobe (series 3, images 14-19), which appears similar to the post thrombectomy CT and most likely represents contrast staining, although superimposed hemorrhage cannot be excluded. No new area of hypodensity. No mass, mass effect, or midline shift. No hydrocephalus. Vascular: No hyperdense vessel. Skull: Normal. Negative for fracture or focal lesion. Sinuses/Orbits: Mucosal thickening and bubbly fluid in the right  maxillary sinus. The orbits are unremarkable. Other: The mastoids are well aerated. IMPRESSION: 1. Hyperdense material in the right temporal and frontal lobe, which appears similar to the post thrombectomy CT and most likely represents contrast staining, although superimposed hemorrhage cannot be excluded. 2. Redemonstrated hypodensity in the bilateral frontal lobes, right parietal lobe, and right occipital lobe, consistent with infarcts. Previously noted cerebellar hypodensity is less conspicuous on this exam. Electronically Signed   By: Merilyn Baba M.D.   On: 03/07/2022 02:32   DG Abd 1 View  Result Date: 03/22/2022 CLINICAL DATA:  [id number] Encounter for orogastric (OG) tube placement [id number] EXAM: ABDOMEN - 1 VIEW; PORTABLE CHEST - 1 VIEW COMPARISON:  None Available. FINDINGS: Enteric tube courses below the hemidiaphragm with tip and side port overlying the expected region of the gastric lumen. Enlarged cardiac silhouette with some component likely due to AP portable technique. The heart and mediastinal contours are within normal limits. Aortic calcification. Consolidative airspace opacity of the left upper lobe. No pulmonary edema. No pleural effusion. No pneumothorax. The bowel gas pattern is normal. Right upper quadrant clips noted. Bowel anastomotic staple sutures noted overlying the pelvis. Excretion of intravenous contrast within bilateral renal pelvis sees and urinary bladder lumen. No radio-opaque calculi or other significant radiographic abnormality are seen. No acute osseous abnormality. IMPRESSION: 1. Consolidative airspace opacity of the left upper lobe. Followup PA and lateral chest X-ray is recommended in 3-4 weeks following  therapy to ensure resolution and exclude underlying malignancy. 2. Nonobstructive bowel gas pattern. 3. Enteric tube in good position. 4.  Aortic Atherosclerosis (ICD10-I70.0). Electronically Signed   By: Iven Finn M.D.   On: 02/25/2022 18:18   DG CHEST PORT 1  VIEW  Result Date: 03/04/2022 CLINICAL DATA:  [id number] Encounter for orogastric (OG) tube placement [id number] EXAM: ABDOMEN - 1 VIEW; PORTABLE CHEST - 1 VIEW COMPARISON:  None Available. FINDINGS: Enteric tube courses below the hemidiaphragm with tip and side port overlying the expected region of the gastric lumen. Enlarged cardiac silhouette with some component likely due to AP portable technique. The heart and mediastinal contours are within normal limits. Aortic calcification. Consolidative airspace opacity of the left upper lobe. No pulmonary edema. No pleural effusion. No pneumothorax. The bowel gas pattern is normal. Right upper quadrant clips noted. Bowel anastomotic staple sutures noted overlying the pelvis. Excretion of intravenous contrast within bilateral renal pelvis sees and urinary bladder lumen. No radio-opaque calculi or other significant radiographic abnormality are seen. No acute osseous abnormality. IMPRESSION: 1. Consolidative airspace opacity of the left upper lobe. Followup PA and lateral chest X-ray is recommended in 3-4 weeks following therapy to ensure resolution and exclude underlying malignancy. 2. Nonobstructive bowel gas pattern. 3. Enteric tube in good position. 4.  Aortic Atherosclerosis (ICD10-I70.0). Electronically Signed   By: Iven Finn M.D.   On: 03/19/2022 18:18   CT HEAD CODE STROKE WO CONTRAST  Addendum Date: 03/19/2022   ADDENDUM REPORT: 03/11/2022 15:28 ADDENDUM: Findings discussed with Dr. Lorrin Goodell via telephone at 3:20 p.m. Electronically Signed   By: Margaretha Sheffield M.D.   On: 03/23/2022 15:28   Result Date: 03/04/2022 CLINICAL DATA:  Code stroke.  Neuro deficit, acute, stroke suspected EXAM: CT HEAD WITHOUT CONTRAST TECHNIQUE: Contiguous axial images were obtained from the base of the skull through the vertex without intravenous contrast. RADIATION DOSE REDUCTION: This exam was performed according to the departmental dose-optimization program which includes  automated exposure control, adjustment of the mA and/or kV according to patient size and/or use of iterative reconstruction technique. COMPARISON:  None Available. FINDINGS: Brain: Hypoattenuation loss of gray differentiation in bilateral frontal lobes and right parietal and occipital lobes, compatible with acute infarcts. Also, small areas of hypoattenuation in bilateral cerebellum, suggestive of acute infarcts. No evidence of acute hemorrhage, mass lesion, midline shift or hydrocephalus. Vascular: No hyperdense vessel identified. Skull: No acute fracture. Sinuses/Orbits: Partially imaged right maxillary sinus mucosal thickening with air-fluid level. No acute orbital findings. Other: No mastoid effusions. IMPRESSION: 1. Findings concerning for acute infarcts in bilateral frontal lobes, right parietal and occipital lobes and bilateral cerebellum. Given involvement of multiple vascular territories, consider embolic etiology. Also, recommend MRI to further characterize. 2. No acute hemorrhage. Electronically Signed: By: Margaretha Sheffield M.D. On: 02/23/2022 15:11   CT ANGIO HEAD NECK W WO CM (CODE STROKE)  Result Date: 03/20/2022 CLINICAL DATA:  Neuro deficit, acute, stroke suspected EXAM: CT ANGIOGRAPHY HEAD AND NECK TECHNIQUE: Multidetector CT imaging of the head and neck was performed using the standard protocol during bolus administration of intravenous contrast. Multiplanar CT image reconstructions and MIPs were obtained to evaluate the vascular anatomy. Carotid stenosis measurements (when applicable) are obtained utilizing NASCET criteria, using the distal internal carotid diameter as the denominator. RADIATION DOSE REDUCTION: This exam was performed according to the departmental dose-optimization program which includes automated exposure control, adjustment of the mA and/or kV according to patient size and/or use of iterative reconstruction technique. COMPARISON:  None  Available. FINDINGS: CTA NECK  FINDINGS Aortic arch: Great vessel origins are patent. Right carotid system: No evidence of dissection, stenosis (50% or greater), or occlusion. Left carotid system: No evidence of dissection, stenosis (50% or greater), or occlusion. Vertebral arteries: Right dominant. No evidence of dissection, stenosis (50% or greater), or occlusion. Skeleton: Multilevel degenerative change.  No acute findings. Other neck: No acute findings. Upper chest: Extensive consolidation in the left upper lobe. Review of the MIP images confirms the above findings CTA HEAD FINDINGS Anterior circulation: Bilateral intracranial ICAs are patent with mild calcific atherosclerosis. Occlusion of the distal right M1 MCA with some opacification of more distal right MCA branches. Left MCA and bilateral ACAs are patent. Posterior circulation: Bilateral intradural vertebral arteries, basilar artery and bilateral posterior cerebral arteries are patent without proximal high-grade stenosis. Venous sinuses: As permitted by contrast timing, patent. Review of the MIP images confirms the above findings IMPRESSION: 1. Distal right M1 MCA occlusion. 2. Extensive consolidation in the left upper lobe, suspicious for pneumonia and/or aspiration. Findings discussed with Dr. Lorrin Goodell via telephone at 3:20 p.m. Electronically Signed   By: Margaretha Sheffield M.D.   On: 03/17/2022 15:22    Labs:  CBC: Recent Labs    02/28/22 1003 03/20/2022 1500 03/02/2022 1505 03/18/2022 1810 03/08/22 0810  WBC 5.7 11.1*  --   --  13.5*  HGB 13.8 14.2 14.6 11.2* 10.7*  HCT 40.9 42.5 43.0 33.0* 34.6*  PLT 194 104*  --   --  105*    COAGS: Recent Labs    03/24/2022 1500 03/08/22 0453  INR 1.8* 1.5*  APTT 29  --     BMP: Recent Labs    03/07/22 0015 03/07/22 0547 03/07/22 2344 03/08/22 0453  NA 142 141 140 143  K 3.5 4.3 4.4 4.4  CL 110 110 113* 115*  CO2 22 20* 18* 20*  GLUCOSE 115* 107* 97 97  BUN 15 14 12 10   CALCIUM 8.0* 7.8* 7.7* 8.1*  CREATININE  0.71 0.72 0.72 0.66  GFRNONAA >60 >60 >60 >60    LIVER FUNCTION TESTS: Recent Labs    02/28/22 1003 03/12/2022 1500 03/07/22 0547  BILITOT 1.0 1.1 0.5  AST 27 60* 45*  ALT 17 23 19   ALKPHOS 68 61 44  PROT 7.1 6.4* 4.4*  ALBUMIN 4.1 3.5 2.5*    Assessment and Plan:  76 y.o. female inpatient. History of HTN, HLD, DVT, CAD (on eliquis). Presented to the ED at [facility] on 11.12.23 as a Code Stroke with left sided weakness and facial drop with repetitive speech. Found to have an occluded MCA M1 s/p right common carotid arteriogram and bilateral vertebral artery angiograms. s/p revascaulation of occluded right middle cerebral artery distal M1 segment with 2 passes of contact aspiration, and 1 pass with a 3 mm x 20 mm solitaire X retrieval device and contact aspiration achieving a TICI 2C revascularization  Post Procedure MRA Head from 11.13.23 reads  Scattered and confluent areas of restricted diffusion in the bilateral cerebral and cerebellar hemispheres, consistent with acute infarcts, likely embolic. 2. Small amount of subarachnoid hemorrhage in the right temporoparietal occipital region, bilateral occipital horns and along the tentorium. 3. No evidence of high-grade flow-limiting stenosis or proximal branch occlusion in the intracranial circulation.Patient seen at bedside with Dr. Luanne Bras. She remains ventilated and sedated. No visitors present. Patient remains on precedex and phenylephrine.  Propofol currently turned off. Patient will open one eye to verbal stimuli. Not able to follow commands.  Is able to nod her head yes and can spontaneously move her right upper extremity and right toes. Currently in a fib with RVR. Right groin site previously with oozing now without issue. Dressing C/D/I.    Electronically Signed: Jacqualine Mau, NP 03/08/2022, 9:35 AM   I spent a total of 15 Minutes at the patient's bedside AND on the patient's hospital floor or unit, greater than 50% of  which was counseling/coordinating care for occluded MCA M1 s/p right common carotid arteriogram and bilateral vertebral artery angiograms. s/p revascaulation of occluded right middle cerebral artery distal M1 segment with 2 passes of contact aspiration, and 1 pass with a 3 mm x 20 mm solitaire X retrieval device and contact aspiration achieving a TICI 2C revascularization

## 2022-03-08 NOTE — Progress Notes (Addendum)
Initial Nutrition Assessment  DOCUMENTATION CODES:   Non-severe (moderate) malnutrition in context of chronic illness  INTERVENTION:  - Possible extubation today.   - If unable to extubate and plan to start TFs, would recommend below TF regimen: Initiate tube feeding via OG: Peptamen 1.5 at 40 ml/h (960 ml per day) *Start at 40mL/hr and advance by 66mL Q12H to goal - risk of refeeding Prosource TF20 60 ml daily Provides 1520 kcal, 85 gm protein, 737 ml free water daily  - Monitor electrolytes and replete as needed - pt at possible risk for refeeding syndrome due to weight loss within the past 3 months and unknown intake PTA. - Free water flushes per CCM   ADDENDUM (16:13) RD received consult to start tube feeds. Confirmed with NP plan to start tube feeds this afternoon. Will order TF regimen as above.    NUTRITION DIAGNOSIS:   Moderate Malnutrition related to chronic illness as evidenced by mild fat depletion, mild muscle depletion.  GOAL:   Patient will meet greater than or equal to 90% of their needs  MONITOR:   Vent status, Diet advancement, Weight trends  REASON FOR ASSESSMENT:   Consult Enteral/tube feeding initiation and management  ASSESSMENT:   76 y.o. female with PMH DVT, HTN, HLD who presented after stroke.  Patient intubated, sedated, and in restraints at time of visit this AM. No family present at bedside to obtain history. Per chart review, possible 7# or 5% weight loss within the past 2.5 months.  Cortrak order placed this AM. Pt discussed with NP - plan to try and extubate patient today. If unable to extubate, plan to start tube feeds.    Medications reviewed and include: Colace, Miralax, Protonix Fentanyl Phenylephrine Propofol - weaning  Labs reviewed:  -   NUTRITION - FOCUSED PHYSICAL EXAM:  Flowsheet Row Most Recent Value  Orbital Region Moderate depletion  Upper Arm Region Mild depletion  Thoracic and Lumbar Region Mild depletion   Buccal Region Unable to assess  Temple Region Moderate depletion  Clavicle Bone Region Mild depletion  Clavicle and Acromion Bone Region Mild depletion  Scapular Bone Region Unable to assess  Dorsal Hand Unable to assess  Patellar Region Mild depletion  Anterior Thigh Region Mild depletion  Posterior Calf Region Mild depletion  Edema (RD Assessment) None  Hair Reviewed  Eyes Other (Comment)  Mouth Other (Comment)  Skin Reviewed  Nails Reviewed       Diet Order:   Diet Order             Diet NPO time specified  Diet effective now                   EDUCATION NEEDS:   Not appropriate for education at this time  Skin:  Skin Assessment: Reviewed RN Assessment  Last BM:  unknown  Height:  Ht Readings from Last 1 Encounters:  02/25/2022 5\' 3"  (1.6 m)   Weight:  Wt Readings from Last 1 Encounters:  03/11/2022 51.8 kg    Ideal Body Weight:  52.3 kg  BMI:  Body mass index is 20.23 kg/m.  Estimated Nutritional Needs:  Kcal:  1300-1550 kcal Protein:  70-85 grams Fluid:  >/= 1.3L    13/12/23 RD, LDN For contact information, refer to [facility].

## 2022-03-08 NOTE — Progress Notes (Signed)
OT Cancellation Note  Patient Details Name: Sherry Olson MRN: [id number] DOB: 07/29/1945   Cancelled Treatment:    Reason Eval/Treat Not Completed: Patient not medically ready (HR sustaining 170s.)  Mateo Flow 03/08/2022, 8:50 AM

## 2022-03-08 NOTE — Progress Notes (Signed)
2302- Afib RVR up to 160-180. Cardizem orders received.  0010- Cardizem increased to 10, no changes to HR. Dr. Warrick Parisian notified. 0045- increased cardizem to 15 per Dr. Warrick Parisian, if HR not lowered buy 0100 increase to 20.  0130- cardizem running at 20, no change to HR has periods of SR but quickly returns to Afib RVR/SVT, cardiology consult placed.   0300- cardiology at bedside, he will place orders but he is ok with HR up to 160s as long as her BP is tolerating it

## 2022-03-08 NOTE — Progress Notes (Addendum)
STROKE TEAM PROGRESS NOTE   INTERVAL HISTORY No family at the bedside.  Remains intubated, CCM on board and cardiology consulted overnight due to afib with RVR. Initially started on cardizem however, HR maintaining 170s. Plan to switch to amiodarone. Hope to control rhythm prior to extubation. Precedex ordered for sedation as well. Plan to repeat CT in a couple of days prior to resuming [place] due to size of the stroke.    Vitals:   03/08/22 1114 03/08/22 1200 03/08/22 1300 03/08/22 1400  BP: 133/68 125/64 (!) 115/58 (!) 80/41  Pulse: (!) 138 (!) 140 (!) 123 (!) 117  Resp: (!) 26 (!) 23 20 (!) 24  Temp:      TempSrc:      SpO2: 98% 99% 99% 99%  Weight:      Height:       CBC:  Recent Labs  Lab 02/23/2022 1500 02/23/2022 1505 03/19/2022 1810 03/08/22 0810  WBC 11.1*  --   --  13.5*  NEUTROABS 8.1*  --   --   --   HGB 14.2   < > 11.2* 10.7*  HCT 42.5   < > 33.0* 34.6*  MCV 85.3  --   --  94.3  PLT 104*  --   --  105*   < > = values in this interval not displayed.   Basic Metabolic Panel:  Recent Labs  Lab 03/07/22 2344 03/08/22 0453  NA 140 143  K 4.4 4.4  CL 113* 115*  CO2 18* 20*  GLUCOSE 97 97  BUN 12 10  CREATININE 0.72 0.66  CALCIUM 7.7* 8.1*  MG 2.2 2.2   Lipid Panel:  Recent Labs  Lab 03/07/22 0547  CHOL 114  TRIG 125  HDL 32*  CHOLHDL 3.6  VLDL 25  LDLCALC 57   HgbA1c:  Recent Labs  Lab 03/13/2022 1500  HGBA1C 5.0   Urine Drug Screen: No results for input(s): "LABOPIA", "COCAINSCRNUR", "LABBENZ", "AMPHETMU", "THCU", "LABBARB" in the last 168 hours.  Alcohol Level  Recent Labs  Lab 03/05/2022 1500  ETH <10    IMAGING past 24 hours No results found.  PHYSICAL EXAM  Physical Exam  Constitutional: Appears well-developed and well-nourished.   Cardiovascular: Normal rate and regular rhythm.  Respiratory: Effort normal, non-labored breathing  Neuro: Mental Status: Intubated with low dose sedation. Eyes open, not tracking or following commands.   Cranial Nerves: With forced eye opening, eyes in mid position, not blinking to visual threat, doll's eyes absent, not tracking, PERRL. Corneal reflex present on the right, gag and cough present. Breathing over the vent.  Facial symmetry not able to test due to ET tube.  Tongue protrusion not cooperative.  Motor: Tone is normal. Bulk is normal.  Moves antigravity with right upper and lower extremity Left upper extremity is flaccid, minimal movement/ flicker of muscle with left lower extremity  Sensory: Withdraws to pain in right upper and lower extremity Minimal movement to painful stimuli in left lower extremity No movement in left upper extremity  Cerebellar: Unable to test     ASSESSMENT/PLAN [name] is a 76 y.o. female with history of DVT, HTN, HLD, Afibb on Eliquis who presents with sudden onset left-sided weakness, right gaze deviation. Mechanical thrombectomy done 11/12 with TICI2c revascularization. Remained intubated post procedure, MRI done and shows bilateral hemisphere strokes. According the the patients daughter she started feeling poorly on Saturday and likely stopped taking her medicine that day, including her xarelto.   Stroke: right  MCA large infarct with scattered areas of infarct in bilateral cerebral and cerebellar with right M1 occlusion s/p IR with TICI3, likely embolic due to afib non compliant with eliquis Code Stroke CT head- findings concerning for acute infarcts in bilateral frontal lobes, right parietal and occipital lobes and bilateral cerebellum.  CTA head & neck Distal right M1 MCA occlusion.  Post IR CT Hyperdense material in the right temporal and frontal lobe, which appears similar to the post thrombectomy CT and most likely represents contrast staining, although superimposed hemorrhage cannot be excluded. Redemonstrated hypodensity in the bilateral frontal lobes, right parietal lobe, and right occipital lobe, consistent with infarcts. Previously  noted cerebellar hypodensity is less conspicuous on this exam. MRI  Scattered and confluent areas of restricted diffusion in the bilateral cerebral and cerebellar hemispheres, consistent with acute infarcts, likely embolic.  Small amount of subarachnoid hemorrhage in the right temporoparietal occipital region, bilateral occipital horns and along the tentorium.  MRA  R MCA patent now 2D Echo EF 60-65%  LDL 57 HgbA1c 5.0 VTE prophylaxis - heparin subq Eliquis (apixaban) daily prior to admission, now holding eliquis in the setting of SAH and large infarct. ASA 325mg  initiated Therapy recommendations:  pending Disposition:  pending  Atrial fibrillation RVR Home meds: Eliquis, sotalol Not compliant with eliquis, at least 2 days out of eliquis RVR overnight, HR up to 180s - cardiology on board On amiodarone Off cardizem drip  Hypotension Hx of HTN Home meds:  amlodipine, metoprolol succinate, benazepril Unstable- currently requiring vasopressors Phenylephrine  Off cardizem Long-term BP goal normotensive  Hyperlipidemia Home meds:  Atorvastatin 10mg , resumed in hospital LDL 57, goal < 70 Continue statin on discharge  Other Stroke Risk Factors Advanced Age >/= 37  Hx of DVT  Other Active Problems Acute hypoxic respiratory failure and possible CAP Intubated Wean/extubate per CCM Sedation with propofol, off fentanyl, on precedex On rocephin and azithromycin Dysphagia  OG in place    Hospital day # 2  Patient seen and examined by NP/APP with MD. MD to update note as needed.   Sherry Ores, DNP, FNP-BC [facility] Pager: [phone]  ATTENDING NOTE: I reviewed above note and agree with the assessment and plan. Pt was seen and examined.   No family at the bedside. CCM and cardiology at the bedside. Pt overnight Afib RVR, put on amiodarone drip. Currently off cardizem drip but still on pressor and propofol with precedex.   On exam, still intubated, eyes open  but not tracking not blinking to visual threat and not following commands. Eyes midline. Moving RUE and RLE spontaneously but flaccid on the LUE and mild withdraw to the LLE.   Not candidate for extubation today, will need continue amiodarone for rhythm control. Continue sedation with propofol and precedex but pressor for BP management. No on ASA, will consider AC in 5-7 days given large size of infarct and SAH. On TF.  PT/OT pending.   For detailed assessment and plan, please refer to above/below as I have made changes wherever appropriate.   Rosalin Hawking, MD PhD Stroke Neurology 03/08/2022 10:09 PM  This patient is critically ill due to embolic stroke with largest at right MCA, A-fib not compliant with anticoagulation, SAH and at significant risk of neurological worsening, death form recurrent stroke, recurrent ICH, brain herniation, heart failure, seizure. This patient's care requires constant monitoring of vital signs, hemodynamics, respiratory and cardiac monitoring, review of multiple databases, neurological assessment, discussion with family, other specialists and medical decision making of  high complexity. I spent 40 minutes of neurocritical care time in the care of this patient.   To contact Stroke Continuity provider, please refer to [email or web address]. After hours, contact General Neurology

## 2022-03-08 NOTE — Progress Notes (Addendum)
Sherry Olson, MRN:  [id number], DOB:  1945/10/29, LOS: 2 ADMISSION DATE:  03/05/2022, CONSULTATION DATE:  03/11/2022 REFERRING MD:  Dr Derry Lory, CHIEF COMPLAINT:  CVA   History of Present Illness:  Patient admitted with CVA Right MCA M1 segment occlusion, s/p revascularization.  Achieved TICI 2C revascularization  Brought in by ambulance code stroke, left-sided weakness and facial droop Last known well 1345 today-03/17/2022 Noted some confusion over the last few days with some hallucinations  She has a history of hypertension, history of DVT, hyperlipidemia, atrial fibrillation, on Eliquis  Pertinent  Medical History   Past Medical History:  Diagnosis Date   Coronary artery disease    DVT (deep venous thrombosis) (HCC)    HLD (hyperlipidemia)    Hypertension    Significant Hospital Events: Including procedures, antibiotic start and stop dates in addition to other pertinent events   Code stroke CT-IMPRESSION: 1. Findings concerning for acute infarcts in bilateral frontal lobes, right parietal and occipital lobes and bilateral cerebellum. Given involvement of multiple vascular territories, consider embolic etiology. Also, recommend MRI to further characterize. 2. No acute hemorrhage. 11/13 tachycardia overnight. Briefly in AF requiring diltiazem, but converted back to NS. Dilt turned off. Phenylephrine needed for post IR BP goals. MRI 11/13 Scattered and confluent areas of restricted diffusion in the bilateral cerebral and cerebellar hemispheres, consistent with acute infarcts, likely embolic. Small SAH in the right temporoparietal, bilateral occipital horns, and along the tentorium.  Echo 11/13   Interim History / Subjective:  Additional AF overnight with rates up to 170s. Cardiology consulted. Perhaps SVT as well. Treated with digoxin with temporary effect.   Tolerating PSV well this morning.   Objective   Blood pressure 122/81, pulse 93, temperature 99 F (37.2 C),  temperature source Axillary, resp. rate (!) 0, height 5\' 3"  (1.6 m), weight 51.8 kg, SpO2 100 %.    Vent Mode: PSV;CPAP FiO2 (%):  [40 %-50 %] 40 % Set Rate:  [16 bmp] 16 bmp Vt Set:  [410 mL] 410 mL PEEP:  [5 cmH20] 5 cmH20 Pressure Support:  [5 cmH20-12 cmH20] 5 cmH20 Plateau Pressure:  [12 cmH20-17 cmH20] 17 cmH20   Intake/Output Summary (Last 24 hours) at 03/08/2022 0844 Last data filed at 03/08/2022 0800 Gross per 24 hour  Intake 3280.37 ml  Output 450 ml  Net 2830.37 ml    Filed Weights   03/15/2022 1400  Weight: 51.8 kg    Examination: General: Elderly female on vent HENT: [place]/AT. Midline gaze. PERRL.  Lungs: Clear bilateral breath sounds Cardiovascular: RRR, rate 80s. No MRG Abdomen: Soft, normoactive.  Extremities: No acute deformity.  Neuro: Eyes open spontaneously. Not following commands. Move R spontaneously. L to pain.   Resolved Hospital Problem list     Assessment & Plan:   Acute ischemic stroke with MCA occlusion on the right post revascularization SAH - Per stroke service - SBP goal 120-180 - Holding AC - Echo  Atrial fibrillation with fast ventricular response in the 170s and 180s Chronic AF on Eliquis - On diltiazem, will attempt to transition away considering poor effect on HR and hypotension requiring pressors.  - History of amiodarone allergy (palpitations) cardiology consulted.  - Appreciate cardiology recs - Pressors are now off. If BP hold with give BB. If not will start amiodarone.  - Holding AC due to above.   Acute hypoxemic respiratory failure: stoke, CAP - Maintain ventilator support - Lung mechanics look good on SBT but not tracking or following commands.  If we can optimize her HR we will work towards extubation. - Transition to Precedex infusion.  - WUA/SBT as tolerated - VAP bundle  CAP vs aspiration - CTX and Azithromycin - Trend WBC and fever curves.  - If not improving may need to escalate ABX. Was prescribed Augmentin  11/6 for sinusitis.   Hypertension CAD - holding home benazepril, amlodipine, lasix (not taking), metoprolol, sotalol - Restart ASA today  History of DVT (2012) followed by vascular in [place]. AC stopped in 2019. - Monitor  Nutrition  - TF - Cortrak consult   Best Practice (right click and "Reselect all SmartList Selections" daily)   Diet/type: NPO TF DVT prophylaxis: prophylactic heparin  GI prophylaxis: PPI Lines: N/A Foley:  N/A Code Status:  full code Last date of multidisciplinary goals of care discussion [ 11/12 ]   Critical care time 42 minutes   Georgann Housekeeper, AGACNP-BC [facility]  See Amion for personal pager PCCM on call pager [phone] until 7pm. Please call Elink 7p-7a. [phone]  03/08/2022 8:44 AM

## 2022-03-08 NOTE — Plan of Care (Signed)
Personally seen and examined. Agree with Overnight Colleague (see consult note) with the following comments:  Briefly, from chart review, history of HTN, HLD, DVT, and Atrial fibrillation.  There have been questions about adherence to DOAC. There is an allergy of "'palpitations" on amiodarone. She is s/p r MCA stroke s/p thrombectomy.  She is intubated and sedated.  Exam notable for intubated but tracks.  She has a diastolic murmur She is on 60 of neo. She has heart rate variability; heart rates  ~ 100 bpm  Prior testing notable for moderate pericardial effusion and moderate AI.  EKG A fib RVR RBBB and LAFB Tele: AF with variable rates  For key conditions including  PAF CHADSVASC 6 with stroke this admission HTN HLD  SAH  Would recommend  - AC when appropriate per neuro team - given pressor requirement, if heart rates are persistently greater than 120s we would have to reconsider IV amiodarone bolus and and drip (I am unclear her true allergy to this, but we would have few option if still needing pressors) - repeat echo, limited for effusion, in two weeks  Riley Lam, MD FASE [facility] Cardiologist [facility]  [address] [place], [address] [place], [place] [address] [phone]  7:47 AM

## 2022-03-08 NOTE — Progress Notes (Signed)
eLink Physician-Brief Progress Note Patient Name: Sherry Olson DOB: [birth year] MRN: [id number]   Date of Service  03/08/2022  HPI/Events of Note  Patient continues to have paroxysmal atrial fib with RVR.  eICU Interventions  Digoxin 0.25 mg iv x 1 ordered.        Thomasene Lot Chasiti Waddington 03/08/2022, 6:02 AM

## 2022-03-08 NOTE — Progress Notes (Signed)
Amiodarone Drug - Drug Interaction Consult Note  Recommendations:  monitor QTc, lytes closely Amiodarone is metabolized by the cytochrome P450 system and therefore has the potential to cause many drug interactions. Amiodarone has an average plasma half-life of 50 days (range 20 to 100 days).   There is potential for drug interactions to occur several weeks or months after stopping treatment and the onset of drug interactions may be slow after initiating amiodarone.   [x]  Statins: Increased risk of myopathy. Simvastatin- restrict dose to 20mg  daily. Other statins: counsel patients to report any muscle pain or weakness immediately.  []  Anticoagulants: Amiodarone can increase anticoagulant effect. Consider warfarin dose reduction. Patients should be monitored closely and the dose of anticoagulant altered accordingly, remembering that amiodarone levels take several weeks to stabilize.  []  Antiepileptics: Amiodarone can increase plasma concentration of phenytoin, the dose should be reduced. Note that small changes in phenytoin dose can result in large changes in levels. Monitor patient and counsel on signs of toxicity.  []  Beta blockers: increased risk of bradycardia, AV block and myocardial depression. Sotalol - avoid concomitant use.  []   Calcium channel blockers (diltiazem and verapamil): increased risk of bradycardia, AV block and myocardial depression.  []   Cyclosporine: Amiodarone increases levels of cyclosporine. Reduced dose of cyclosporine is recommended.  [x]  Digoxin dose should be halved when amiodarone is started.  Only digoxin load given.  []  Diuretics: increased risk of cardiotoxicity if hypokalemia occurs.  []  Oral hypoglycemic agents (glyburide, glipizide, glimepiride): increased risk of hypoglycemia. Patient's glucose levels should be monitored closely when initiating amiodarone therapy.   [x]  Drugs that prolong the QT interval:  Torsades de pointes risk may be increased with  concurrent use - avoid if possible.  Monitor QTc, also keep magnesium/potassium WNL if concurrent therapy can't be avoided.  Antibiotics: e.g. fluoroquinolones, erythromycin.  Antiarrhythmics: e.g. quinidine, procainamide, disopyramide, sotalol.  Antipsychotics: e.g. phenothiazines, haloperidol.   Lithium, tricyclic antidepressants, and methadone. Thank You,  D. , PharmD, BCPS, BCCCP 03/08/2022, 9:51 AM

## 2022-03-08 NOTE — Progress Notes (Signed)
0800:  Patient's heart rate sustaining in 170s, CCM and Neuro notified and aware.  New orders received.

## 2022-03-08 NOTE — Progress Notes (Signed)
PT Cancellation Note  Patient Details Name: Sherry Olson MRN: [id number] DOB: [birth year]   Cancelled Treatment:    Reason Eval/Treat Not Completed: Patient not medically ready. HR sustaining 170s. PT to return as able/appropriate to complete PT eval.  Lewis Shock, PT, DPT Acute Rehabilitation Services Secure chat preferred Office #: [phone]    [name] 03/08/2022, 8:27 AM

## 2022-03-09 ENCOUNTER — Inpatient Hospital Stay (HOSPITAL_COMMUNITY): Payer: Medicare Other

## 2022-03-09 DIAGNOSIS — I63511 Cerebral infarction due to unspecified occlusion or stenosis of right middle cerebral artery: Secondary | ICD-10-CM | POA: Diagnosis not present

## 2022-03-09 DIAGNOSIS — J9621 Acute and chronic respiratory failure with hypoxia: Secondary | ICD-10-CM | POA: Diagnosis not present

## 2022-03-09 DIAGNOSIS — I351 Nonrheumatic aortic (valve) insufficiency: Secondary | ICD-10-CM | POA: Diagnosis not present

## 2022-03-09 DIAGNOSIS — J9622 Acute and chronic respiratory failure with hypercapnia: Secondary | ICD-10-CM | POA: Diagnosis not present

## 2022-03-09 DIAGNOSIS — I340 Nonrheumatic mitral (valve) insufficiency: Secondary | ICD-10-CM

## 2022-03-09 DIAGNOSIS — I4819 Other persistent atrial fibrillation: Secondary | ICD-10-CM

## 2022-03-09 DIAGNOSIS — I4891 Unspecified atrial fibrillation: Secondary | ICD-10-CM | POA: Diagnosis not present

## 2022-03-09 LAB — CBC
HCT: 29.4 % — ABNORMAL LOW (ref 36.0–46.0)
Hemoglobin: 9.7 g/dL — ABNORMAL LOW (ref 12.0–15.0)
MCH: 29.8 pg (ref 26.0–34.0)
MCHC: 33 g/dL (ref 30.0–36.0)
MCV: 90.2 fL (ref 80.0–100.0)
Platelets: 95 10*3/uL — ABNORMAL LOW (ref 150–400)
RBC: 3.26 MIL/uL — ABNORMAL LOW (ref 3.87–5.11)
RDW: 13.5 % (ref 11.5–15.5)
WBC: 9.1 10*3/uL (ref 4.0–10.5)
nRBC: 0 % (ref 0.0–0.2)

## 2022-03-09 LAB — PROTIME-INR
INR: 1.2 (ref 0.8–1.2)
Prothrombin Time: 15.4 seconds — ABNORMAL HIGH (ref 11.4–15.2)

## 2022-03-09 LAB — MAGNESIUM
Magnesium: 1.9 mg/dL (ref 1.7–2.4)
Magnesium: 1.9 mg/dL (ref 1.7–2.4)

## 2022-03-09 LAB — PHOSPHORUS
Phosphorus: 2.9 mg/dL (ref 2.5–4.6)
Phosphorus: 3.1 mg/dL (ref 2.5–4.6)

## 2022-03-09 LAB — CULTURE, RESPIRATORY W GRAM STAIN
Culture: NORMAL
Gram Stain: NONE SEEN

## 2022-03-09 LAB — COMPREHENSIVE METABOLIC PANEL
ALT: 16 U/L (ref 0–44)
AST: 29 U/L (ref 15–41)
Albumin: 2.3 g/dL — ABNORMAL LOW (ref 3.5–5.0)
Alkaline Phosphatase: 49 U/L (ref 38–126)
Anion gap: 8 (ref 5–15)
BUN: 11 mg/dL (ref 8–23)
CO2: 19 mmol/L — ABNORMAL LOW (ref 22–32)
Calcium: 8.3 mg/dL — ABNORMAL LOW (ref 8.9–10.3)
Chloride: 114 mmol/L — ABNORMAL HIGH (ref 98–111)
Creatinine, Ser: 0.58 mg/dL (ref 0.44–1.00)
GFR, Estimated: >60 mL/min (ref >60)
Glucose, Bld: 110 mg/dL — ABNORMAL HIGH (ref 70–99)
Potassium: 4.1 mmol/L (ref 3.5–5.1)
Sodium: 141 mmol/L (ref 135–145)
Total Bilirubin: 0.4 mg/dL (ref 0.3–1.2)
Total Protein: 4.9 g/dL — ABNORMAL LOW (ref 6.5–8.1)

## 2022-03-09 LAB — HEPARIN LEVEL (UNFRACTIONATED): Heparin Unfractionated: 0.45 IU/mL (ref 0.30–0.70)

## 2022-03-09 LAB — GLUCOSE, CAPILLARY: Glucose-Capillary: 112 mg/dL — ABNORMAL HIGH (ref 70–99)

## 2022-03-09 MED ORDER — LORAZEPAM 2 MG/ML IJ SOLN
INTRAMUSCULAR | Status: AC
  Filled 2022-03-09: qty 1

## 2022-03-09 MED ORDER — NITROGLYCERIN 2 % TD OINT
1.0000 [in_us] | TOPICAL_OINTMENT | Freq: Three times a day (TID) | TRANSDERMAL | Status: DC
  Filled 2022-03-09: qty 30

## 2022-03-09 MED ORDER — FENTANYL CITRATE PF 50 MCG/ML IJ SOSY
PREFILLED_SYRINGE | INTRAMUSCULAR | Status: AC
  Administered 2022-03-09: 50 ug
  Filled 2022-03-09: qty 1

## 2022-03-09 MED ORDER — HEPARIN (PORCINE) 25000 UT/250ML-% IV SOLN
900.0000 [IU]/h | INTRAVENOUS | Status: DC
  Administered 2022-03-09: 700 [IU]/h via INTRAVENOUS
  Administered 2022-03-10: 900 [IU]/h via INTRAVENOUS
  Filled 2022-03-09 (×2): qty 250

## 2022-03-09 MED ORDER — SENNOSIDES-DOCUSATE SODIUM 8.6-50 MG PO TABS
1.0000 | ORAL_TABLET | Freq: Every evening | ORAL | Status: DC | PRN
  Administered 2022-03-09: 1

## 2022-03-09 MED ORDER — METOPROLOL TARTRATE 5 MG/5ML IV SOLN
5.0000 mg | Freq: Once | INTRAVENOUS | Status: AC
  Administered 2022-03-09: 5 mg via INTRAVENOUS
  Filled 2022-03-09: qty 5

## 2022-03-09 MED ORDER — LORAZEPAM 2 MG/ML IJ SOLN
1.0000 mg | INTRAMUSCULAR | Status: DC | PRN
  Administered 2022-03-09: 2 mg via INTRAVENOUS

## 2022-03-09 MED ORDER — ETOMIDATE 2 MG/ML IV SOLN
INTRAVENOUS | Status: AC
  Filled 2022-03-09: qty 10

## 2022-03-09 MED ORDER — SODIUM CHLORIDE 0.9 % IV SOLN: INTRAVENOUS | Status: DC

## 2022-03-09 MED ORDER — ETOMIDATE 2 MG/ML IV SOLN
20.0000 mg | Freq: Once | INTRAVENOUS | Status: AC
  Administered 2022-03-09: 20 mg via INTRAVENOUS

## 2022-03-09 MED ORDER — PHENTOLAMINE MESYLATE 5 MG IJ SOLR
5.0000 mg | Freq: Once | INTRAMUSCULAR | Status: DC
  Filled 2022-03-09 (×2): qty 5

## 2022-03-09 MED ORDER — FENTANYL CITRATE PF 50 MCG/ML IJ SOSY
25.0000 ug | PREFILLED_SYRINGE | INTRAMUSCULAR | Status: DC | PRN
  Administered 2022-03-10 – 2022-03-12 (×5): 25 ug via INTRAVENOUS
  Filled 2022-03-09 (×6): qty 1

## 2022-03-09 MED ORDER — DILTIAZEM HCL-DEXTROSE 125-5 MG/125ML-% IV SOLN (PREMIX)
5.0000 mg/h | INTRAVENOUS | Status: DC
  Administered 2022-03-09: 5 mg/h via INTRAVENOUS
  Administered 2022-03-10: 10 mg/h via INTRAVENOUS
  Filled 2022-03-09 (×4): qty 125

## 2022-03-09 NOTE — Progress Notes (Signed)
Over the last 30 minutes HR has consistently been in the 140s. Cardiology paged, HR dropped to the 90s before raising back to the 140s. Cardiology is aware, day team will assess.

## 2022-03-09 NOTE — Progress Notes (Signed)
RT advanced ETT to 22@lip  due to cuff leak and lower than normal return volumes, along with increased PIP, and noises and being able to see cuff in the back of pts throat. Bilateral breath sounds noted. RN notified. CXR ordered to confirm placement.

## 2022-03-09 NOTE — Progress Notes (Signed)
SLP Cancellation Note  Patient Details Name: Sherry Olson MRN: [id number] DOB: 17-Mar-1946   Cancelled treatment:       Reason Eval/Treat Not Completed: Patient not medically ready   Navarro Nine, Riley Nearing 03/09/2022, 8:57 AM

## 2022-03-09 NOTE — Progress Notes (Signed)
Cortrak Tube Team Note:  Consult received to reposition patient's Cortrak feeding tube after procedure.   X-ray is required, abdominal x-ray has been ordered by the Cortrak team. Please confirm tube placement before using the Cortrak tube.   If the tube becomes dislodged please keep the tube and contact the Cortrak team at [email or web address] for replacement.  If after hours and replacement cannot be delayed, place a NG tube and confirm placement with an abdominal x-ray.    Cammy Copa., RD, LDN, CNSC See AMiON for contact information

## 2022-03-09 NOTE — Progress Notes (Signed)
Sherry Olson, MRN:  [id number], DOB:  April 09, 1946, LOS: 3 ADMISSION DATE:  02/26/2022, CONSULTATION DATE:  03/24/2022 REFERRING MD: Lorrin Goodell - Neuro, CHIEF COMPLAINT:  CVA   History of Present Illness:  76 year old woman who presented to [facility] ED 11/12 via EMS as Code Stroke with reported L-sided weakness and facial droop. LKW 1345 11/12. Intermittent confusion x 2-3 days with some hallucinations. PMHx significant for HTN, HLD, CAD, history of DVT, AF (on Eliquis)  Found to have R MCA M1 segment occlusion, s/p mechanical thrombectomy with revascularization.  Achieved TICI 2C revascularization.  PCCM consulted.  Pertinent Medical History:   Past Medical History:  Diagnosis Date   Coronary artery disease    DVT (deep venous thrombosis) (HCC)    HLD (hyperlipidemia)    Hypertension    Significant Hospital Events: Including procedures, antibiotic start and stop dates in addition to other pertinent events   11/12 Code Stroke CT concerning for acute infarcts in bilateral frontal lobes, right parietal and occipital lobes and bilateral cerebellum. Given involvement of multiple vascular territories, consider embolic etiology. Also, recommend MRI to further characterize. 2. No acute hemorrhage. 11/13 tachycardia overnight. Briefly in AF requiring diltiazem, but converted back to NS. Dilt turned off. Phenylephrine needed for post IR BP goals. MRI 11/13 Scattered and confluent areas of restricted diffusion in the bilateral cerebral and cerebellar hemispheres, consistent with acute infarcts, likely embolic. Small SAH in the right temporoparietal, bilateral occipital horns, and along the tentorium.  Echo 11/13 LVEF 60-65%, no RWMAs, normal RV systolic function, moderate pericardial effusion with dilated IVC, mild diastolic RV collapse, +MR 11/15 Afib with RVR overnight to 140s  Interim History / Subjective:  Ongoing Afib with RVR to 140s, ?SVT Amiodarone initiated, unfortunately IV  infiltrated Neo initiated as pressures soft Diltiazem gtt initiated Weaning on vent, but appears very fatigued/weak Ongoing AC discussion with Stroke/Cardiology teams, as this is the barrier to TEE/DCCV Per Dr. Erlinda Hong, ok for heparin [facility] for cardioversion; d/c ASA Plan for DCCV 11/15 vs. 11/16 Difficult access  Objective:  Blood pressure (!) 113/50, pulse 86, temperature 97.9 F (36.6 C), temperature source Axillary, resp. rate (!) 25, height 5\' 3"  (1.6 m), weight 61.4 kg, SpO2 100 %.    Vent Mode: PSV;CPAP FiO2 (%):  [40 %] 40 % Set Rate:  [16 bmp] 16 bmp Vt Set:  [410 mL] 410 mL PEEP:  [5 cmH20] 5 cmH20 Pressure Support:  [5 cmH20-8 cmH20] 8 cmH20 Plateau Pressure:  [11 cmH20-19 cmH20] 19 cmH20   Intake/Output Summary (Last 24 hours) at 03/09/2022 [id number] Last data filed at 03/09/2022 0600 Gross per 24 hour  Intake 1326.37 ml  Output 475 ml  Net 851.37 ml    Filed Weights   03/12/2022 1400 03/09/22 0500  Weight: 51.8 kg 61.4 kg   Physical Examination: General: Acutely ill-appearing elderly woman in NAD. Appears fatigued. HEENT: [place]/AT, anicteric sclera, PERRL 81mm, moist mucous membranes. ETT/OGT in place. Neuro:  Awake, nodding to questions, does not track examiner.  Responds to verbal stimuli. Following commands intermittently. Unilateral L-sided neglect noted. Generalized weakness. +Corneal, +Cough, and +Gag  CV: Irregularly irregular rhythm, rate 130s, no m/g/r. PULM: Breathing even and mildly labored on vent, mild tachypnea, (PSV 8/5). Lung fields coarse. GI: Soft, nontender, nondistended. Normoactive bowel sounds. Extremities: Bilateral UE edema noted, R > L in the setting of IV infiltration. Trace bilateral symmetric LE edema. Skin: Warm/dry, ecchymosis to posterior R forearm.  Resolved Hospital Problem List:     Assessment &  Plan:   Acute ischemic stroke with MCA occlusion on the right post revascularization SAH - Stroke team primary - Goal SBP 100-160s -  Neosynephrine titrated to goal SBP - Discontinue ASA per Stroke - Begin heparin gtt for [facility] for pending TEE/DCCV - PT/OT/SLP when clinically appropriate  Atrial fibrillation with fast ventricular response in the 170s and 180s Chronic AF on Eliquis History of amiodarone allergy (palpitations) cardiology consulted. - Cardiology following, appreciate recommendations - Continues on amiodarone gtt, though this infiltrated 11/15AM - Diltiazem gtt resumed 11/15 in the setting of ongoing Afib with RVR/poor control - Neosynephrine as above to support BP - AC initiation (heparin gtt) as above 11/15 - TEE/DCCV 11/15 versus 11/16  Acute hypoxemic respiratory failure: stroke, CAP If unable to extubate in the next few days, would likely need to consider tracheostomy versus transition to comfort, pending progress and prognosis - Continue full vent support (4-8cc/kg IBW) - Wean FiO2 for O2 sat > 90% - Daily WUA/SBT; lung mechanics adequate but not tracking examiner and still somewhat inconsistent command following; also suspect some component of fatigue/weakness playing a role - Hopeful that improved hemodynamics will facilitate vent weaning - VAP bundle - Pulmonary hygiene - PAD protocol for sedation: Precedex for goal RASS 0 to -1  CAP vs aspiration - Continue ceftriaxone, azithromycin - Trend WBC, fever curve - Low threshold to broaden antibiotics  Hypertension CAD - Hold home Benazepril, Lasix, metoprolol, sotalol - Hold ASA (per Stroke), given heparin initiation  History of DVT (2012) followed by vascular in [place]. AC stopped in 2019. - AC reinitiation 11/15 in the setting of TEE/DCCV need  Nutrition  - Cortrak placed 11/15, appreciate Nutrition team - TF via Cortrak  Best Practice (right click and "Reselect all SmartList Selections" daily)   Diet/type: NPO TF DVT prophylaxis: prophylactic heparin  GI prophylaxis: PPI Lines: N/A Foley:  N/A Code Status:  full code Last date  of multidisciplinary goals of care discussion [ 11/12 ]  Critical care time: 38 minutes   The patient is critically ill with multiple organ system failure and requires high complexity decision making for assessment and support, frequent evaluation and titration of therapies, advanced monitoring, review of radiographic studies and interpretation of complex data.   Critical Care Time devoted to patient care services, exclusive of separately billable procedures, described in this note is 38 minutes.  Tim Lair, PA-C [facility] 03/09/22 8:23 AM  Please see [email or web address] for pager details.  From 7A-7P if no response, please call [phone] After hours, please call ELink [phone]

## 2022-03-09 NOTE — Progress Notes (Signed)
OT Cancellation Note  Patient Details Name: Sherry Olson MRN: [id number] DOB: Jan 27, 1946   Cancelled Treatment:    Reason Eval/Treat Not Completed: Patient not medically ready (intubated pending update from readiness for activity)  Mateo Flow 03/09/2022, 10:28 AM

## 2022-03-09 NOTE — Progress Notes (Signed)
Upon assessment this RN observed that IV in right AC had infiltrated. IV amiodarone had been going through this IV.  NP Cloyd Stagers and [facility] were notified.  IV was removed and arm was elevated and warm packs were placed on it.

## 2022-03-09 NOTE — Progress Notes (Signed)
ANTICOAGULATION CONSULT NOTE - Initial Consult  Pharmacy Consult:  Heparin Indication: atrial fibrillation  Allergies  Allergen Reactions   Amiodarone Palpitations    "Heart trouble" per patient's daughter and husband.   Colesevelam Nausea And Vomiting   Welchol [Colesevelam Hcl] Diarrhea and Nausea And Vomiting    Patient Measurements: Height: 5\' 3"  (160 cm) Weight: 61.4 kg (135 lb 5.8 oz) IBW/kg (Calculated) : 52.4 Heparin Dosing Weight: 61 kg  Vital Signs: Temp: 99 F (37.2 C) (11/15 0800) Temp Source: Axillary (11/15 0800) BP: 113/63 (11/15 1400) Pulse Rate: 140 (11/15 1400)  Labs: Recent Labs    2022/03/20 1500 Mar 20, 2022 1505 March 20, 2022 1810 03/07/22 0015 03/07/22 2344 03/08/22 0453 03/08/22 0810 03/09/22 0605  HGB 14.2   < > 11.2*  --   --   --  10.7* 9.7*  HCT 42.5   < > 33.0*  --   --   --  34.6* 29.4*  PLT 104*  --   --   --   --   --  105* 95*  APTT 29  --   --   --   --   --   --   --   LABPROT 20.6*  --   --   --   --  17.7*  --   --   INR 1.8*  --   --   --   --  1.5*  --   --   CREATININE 0.87   < >  --    < > 0.72 0.66  --  0.58   < > = values in this interval not displayed.    Estimated Creatinine Clearance: 48.7 mL/min (by C-G formula based on SCr of 0.58 mg/dL).   Medical History: Past Medical History:  Diagnosis Date   Coronary artery disease    DVT (deep venous thrombosis) (HCC)    HLD (hyperlipidemia)    Hypertension     Assessment: 20 YOF with Afib on Eliquis PTA, last dose unknown.  Patient has an acute stroke with a small SAH s/p revascularization on 11/12.  She has been in Afib RVR and Pharmacy consulted to dose IV heparin for planned cardioversion.  Renal function stable.  Hemoglobin/hematocrit and platelet count are low; no bleeding reported.  Goal of Therapy:  Heparin level 0.3-0.5 units/ml aPTT 66-85 seconds Monitor platelets by anticoagulation protocol: Yes   Plan:  Heparin infusion at 700 units/hr - no bolus with SAH Check  8 hr aPTT and heparin level Daily heparin level, aPTT and CBC  Quincy Boy D. 13/12, PharmD, BCPS, BCCCP 03/09/2022, 2:31 PM

## 2022-03-09 NOTE — Progress Notes (Addendum)
STROKE TEAM PROGRESS NOTE   INTERVAL HISTORY RN is at the bedside. Remains intubated, but more awake alert, eyes open and following commands. Still has afib RVR, and HR 140-150s. Still on amiodarone drip, just off pressors, discussed with Dr. Gasper Sells, will try cardizem drip.    Vitals:   03/09/22 1000 03/09/22 1055 03/09/22 1100 03/09/22 1200  BP: (!) 114/57 (!) 121/57 (!) 140/90 131/81  Pulse: (!) 146 (!) 149 (!) 168 (!) 145  Resp: (!) 25 (!) 26 (!) 27 (!) 30  Temp:      TempSrc:      SpO2: 99% 99% 100% 99%  Weight:      Height:       CBC:  Recent Labs  Lab 03/01/2022 1500 03/07/2022 1505 03/08/22 0810 03/09/22 0605  WBC 11.1*  --  13.5* 9.1  NEUTROABS 8.1*  --   --   --   HGB 14.2   < > 10.7* 9.7*  HCT 42.5   < > 34.6* 29.4*  MCV 85.3  --  94.3 90.2  PLT 104*  --  105* 95*   < > = values in this interval not displayed.   Basic Metabolic Panel:  Recent Labs  Lab 03/08/22 0453 03/08/22 1612 03/09/22 0605  NA 143  --  141  K 4.4  --  4.1  CL 115*  --  114*  CO2 20*  --  19*  GLUCOSE 97  --  110*  BUN 10  --  11  CREATININE 0.66  --  0.58  CALCIUM 8.1*  --  8.3*  MG 2.2 2.0 1.9  PHOS  --  2.8 2.9   Lipid Panel:  Recent Labs  Lab 03/07/22 0547  CHOL 114  TRIG 125  HDL 32*  CHOLHDL 3.6  VLDL 25  LDLCALC 57   HgbA1c:  Recent Labs  Lab 03/20/2022 1500  HGBA1C 5.0   Urine Drug Screen: No results for input(s): "LABOPIA", "COCAINSCRNUR", "LABBENZ", "AMPHETMU", "THCU", "LABBARB" in the last 168 hours.  Alcohol Level  Recent Labs  Lab 03/10/2022 1500  ETH <10    IMAGING past 24 hours DG Abd Portable 1V  Result Date: 03/09/2022 CLINICAL DATA:  Feeding tube placement EXAM: PORTABLE ABDOMEN - 1 VIEW COMPARISON:  Abdomen 02/23/2022 FINDINGS: Feeding tube tip is in the distal body of the stomach. NG tube is been removed since the prior study. Nonobstructive bowel gas pattern. Gas in nondilated transverse colon. Surgical clips in the right upper quadrant.  Progression of left lower lobe airspace disease. IMPRESSION: Feeding tube tip in the distal body of the stomach. Progression of left lower lobe airspace disease. Electronically Signed   By: Franchot Gallo M.D.   On: 03/09/2022 11:33    PHYSICAL EXAM  Temp:  [97.2 F (36.2 C)-99 F (37.2 C)] 99 F (37.2 C) (11/15 0800) Pulse Rate:  [60-168] 145 (11/15 1200) Resp:  [19-30] 30 (11/15 1200) BP: (80-140)/(41-90) 131/81 (11/15 1200) SpO2:  [98 %-100 %] 99 % (11/15 1200) FiO2 (%):  [40 %] 40 % (11/15 1055) Weight:  [61.4 kg] 61.4 kg (11/15 0500)  General - Well nourished, well developed, intubated on precedex.  Ophthalmologic - fundi not visualized due to noncooperation.  Cardiovascular - irregularly irregular heart rate and rhythm with tachycardia  Neuro - intubated on precedex, eyes open, following simple commands on the right but psychomotor slowing. Eyes in mid position, not blinking to visual threat, doll's eyes absent, not tracking, PERRL. Corneal reflex present, gag and cough  present. Breathing over the vent.  Facial symmetry not able to test due to ET tube.  Tongue protrusion not cooperative. RUE and RLE 3/5 against gravity, LUE flaccid. LLE slight withdraw to pain. Sensation, coordination not cooperative and gait not tested.   ASSESSMENT/PLAN Ms. TYSHEA IMEL is a 76 y.o. female with history of DVT, HTN, HLD, Afibb on Eliquis who presents with sudden onset left-sided weakness, right gaze deviation. Mechanical thrombectomy done 11/12 with TICI2c revascularization. Remained intubated post procedure, MRI done and shows bilateral hemisphere strokes. According the the patients daughter she started feeling poorly on Saturday and likely stopped taking her medicine that day, including her xarelto.   Stroke: right MCA large infarct with scattered areas of infarct in bilateral cerebral and cerebellar with right M1 occlusion s/p IR with TICI3, likely embolic due to afib non compliant with  eliquis Code Stroke CT head- findings concerning for acute infarcts in bilateral frontal lobes, right parietal and occipital lobes and bilateral cerebellum.  CTA head & neck Distal right M1 MCA occlusion.  Post IR CT Hyperdense material in the right temporal and frontal lobe, which appears similar to the post thrombectomy CT and most likely represents contrast staining, although superimposed hemorrhage cannot be excluded. Redemonstrated hypodensity in the bilateral frontal lobes, right parietal lobe, and right occipital lobe, consistent with infarcts. Previously noted cerebellar hypodensity is less conspicuous on this exam. MRI  Scattered and confluent areas of restricted diffusion in the bilateral cerebral and cerebellar hemispheres, consistent with acute infarcts, likely embolic.  Small amount of subarachnoid hemorrhage in the right temporoparietal occipital region, bilateral occipital horns and along the tentorium.  MRA  R MCA patent now 2D Echo EF 60-65%  LDL 57 HgbA1c 5.0 VTE prophylaxis - heparin subq Eliquis (apixaban) daily prior to admission, now holding eliquis in the setting of SAH and large infarct. ASA 325mg  initiated, will consider heparin IV 5 days post stroke. Therapy recommendations:  pending Disposition:  pending  Atrial fibrillation RVR Home meds: Eliquis, sotalol Not compliant with eliquis, at least 2 days out of eliquis RVR overnight, HR up to 180s->150s - cardiology on board On amiodarone drip Will try cardizem drip since off pressor now  Hypotension Hx of HTN Home meds:  amlodipine, metoprolol succinate, benazepril Unstable- was requiring vasopressors Off pressor this am BP goal 100-160 Long-term BP goal normotensive  Respiratory failure Possible CAP Intubated On weaning  Vent management per CCM on precedex On rocephin and azithromycin  Hyperlipidemia Home meds:  Atorvastatin 10mg , resumed in hospital LDL 57, goal < 70 Continue statin on  discharge  Other Stroke Risk Factors Advanced Age >/= 58  Hx of DVT  Other Active Problems Dysphagia  OG in place on TF   Hospital day # 3   , MD PhD Stroke Neurology 03/09/2022 12:14 PM  This patient is critically ill due to embolic stroke with largest at right MCA, A-fib not compliant with anticoagulation, SAH and at significant risk of neurological worsening, death form recurrent stroke, recurrent ICH, brain herniation, heart failure, seizure. This patient's care requires constant monitoring of vital signs, hemodynamics, respiratory and cardiac monitoring, review of multiple databases, neurological assessment, discussion with family, other specialists and medical decision making of high complexity. I spent 40 minutes of neurocritical care time in the care of this patient. Discussed with Dr. Marvel Plan cardiology   To contact Stroke Continuity provider, please refer to 03/11/2022. After hours, contact General Neurology

## 2022-03-09 NOTE — Progress Notes (Signed)
Confirmed with Dr. Roda Shutters that bp parameters would be 100-160 systolic.

## 2022-03-09 NOTE — Progress Notes (Signed)
An USGPIV (ultrasound guided PIV) has been placed for short-term vasopressor infusion. A correctly placed ivWatch must be used when administering Vasopressors. Should this treatment be needed beyond 72 hours, central line access should be obtained.  It will be the responsibility of the bedside nurse to follow best practice to prevent extravasations.  HS Doralene Glanz RN 

## 2022-03-09 NOTE — Procedures (Signed)
Cortrak  Person Inserting Tube:  Viviene Thurston C, RD Tube Type:  Cortrak - 43 inches Tube Size:  10 Tube Location:  Left nare Secured by: Bridle Technique Used to Measure Tube Placement:  Marking at nare/corner of mouth Cortrak Secured At:  66 cm   Cortrak Tube Team Note:  Consult received to place a Cortrak feeding tube.   X-ray is required, abdominal x-ray has been ordered by the Cortrak team. Please confirm tube placement before using the Cortrak tube.   If the tube becomes dislodged please keep the tube and contact the Cortrak team at [email or web address] for replacement.  If after hours and replacement cannot be delayed, place a NG tube and confirm placement with an abdominal x-ray.    Chamaine Stankus P., RD, LDN, CNSC See AMiON for contact information    

## 2022-03-09 NOTE — Progress Notes (Signed)
PT Cancellation Note  Patient Details Name: Sherry Olson MRN: [id number] DOB: 02/07/1946   Cancelled Treatment:    Reason Eval/Treat Not Completed: Patient not medically ready. Pt intubated pending possible extubation later today. Will await for update regarding readiness for activity.  Lewis Shock, PT, DPT Acute Rehabilitation Services Secure chat preferred Office #: [phone]    [name] 03/09/2022, 11:50 AM

## 2022-03-09 NOTE — Progress Notes (Addendum)
Progress Note  Patient Name: NIRALYA SPRATT Date of Encounter: 03/09/2022  Primary Cardiologist: None   Subjective   Overnight had RVR. Per our discussion 11/14 and due to the nature of reported allergy (and given limited options) was transitioned to IV amiodarone. Patient on precedex at time of evaluation.  Less active.  Inpatient Medications    Scheduled Meds:  aspirin  325 mg Per Tube Daily   atorvastatin  10 mg Per Tube Daily   Chlorhexidine Gluconate Cloth  6 each Topical Daily   docusate  100 mg Per Tube BID   heparin injection (subcutaneous)  5,000 Units Subcutaneous Q8H   mouth rinse  15 mL Mouth Rinse Q2H   pantoprazole (PROTONIX) IV  40 mg Intravenous QHS   polyethylene glycol  17 g Per Tube Daily   sodium chloride flush  3 mL Intravenous Once   Continuous Infusions:  sodium chloride 10 mL/hr at 03/09/22 0000   amiodarone 30 mg/hr (03/09/22 0000)   azithromycin Stopped (03/08/22 1436)   cefTRIAXone (ROCEPHIN)  IV Stopped (03/08/22 1317)   dexmedetomidine (PRECEDEX) IV infusion 0.8 mcg/kg/hr (03/09/22 0422)   feeding supplement (PEPTAMEN 1.5 CAL)     phenylephrine (NEO-SYNEPHRINE) Adult infusion 20 mcg/min (03/09/22 0000)   PRN Meds: acetaminophen **OR** acetaminophen (TYLENOL) oral liquid 160 mg/5 mL **OR** acetaminophen, LORazepam, mouth rinse, senna-docusate   Vital Signs    Vitals:   03/09/22 0630 03/09/22 0645 03/09/22 0700 03/09/22 0735  BP: 123/67 (!) 113/58 123/79 (!) 113/50  Pulse: (!) 133 (!) 117 97 86  Resp: (!) 22 (!) 24 (!) 21 (!) 25  Temp:      TempSrc:      SpO2: 100% 100% 99% 100%  Weight:      Height:        Intake/Output Summary (Last 24 hours) at 03/09/2022 0815 Last data filed at 03/09/2022 0600 Gross per 24 hour  Intake 1326.37 ml  Output 475 ml  Net 851.37 ml   Filed Weights   03/01/2022 1400 03/09/22 0500  Weight: 51.8 kg 61.4 kg    Telemetry    A fib RVR QTc prolonged - Personally Reviewed  Physical Exam    Gen:  ill appearing Cardiac: No Rubs or Gallops, IRIR tachycardia with diastolic murmur Respiratory: Mechanical breath sounds  GI: Soft, nontender, non-distended  MS: R arm swelling from IV  Labs    Chemistry Recent Labs  Lab 03/21/2022 1500 03/12/2022 1505 03/07/22 0547 03/07/22 2344 03/08/22 0453  NA 140   < > 141 140 143  K 4.0   < > 4.3 4.4 4.4  CL 101   < > 110 113* 115*  CO2 25   < > 20* 18* 20*  GLUCOSE 108*   < > 107* 97 97  BUN 18   < > 14 12 10   CREATININE 0.87   < > 0.72 0.72 0.66  CALCIUM 9.4   < > 7.8* 7.7* 8.1*  PROT 6.4*  --  4.4*  --   --   ALBUMIN 3.5  --  2.5*  --   --   AST 60*  --  45*  --   --   ALT 23  --  19  --   --   ALKPHOS 61  --  44  --   --   BILITOT 1.1  --  0.5  --   --   GFRNONAA >60   < > >60 >60 >60  ANIONGAP 14   < >  11 9 8    < > = values in this interval not displayed.     Hematology Recent Labs  Lab 03/20/2022 1500 03/15/2022 1505 03/15/2022 1810 03/08/22 0810  WBC 11.1*  --   --  13.5*  RBC 4.98  --   --  3.67*  HGB 14.2 14.6 11.2* 10.7*  HCT 42.5 43.0 33.0* 34.6*  MCV 85.3  --   --  94.3  MCH 28.5  --   --  29.2  MCHC 33.4  --   --  30.9  RDW 13.3  --   --  14.1  PLT 104*  --   --  105*    Cardiac EnzymesNo results for input(s): "TROPONINI" in the last 168 hours. No results for input(s): "TROPIPOC" in the last 168 hours.   BNPNo results for input(s): "BNP", "PROBNP" in the last 168 hours.   DDimer No results for input(s): "DDIMER" in the last 168 hours.   Radiology    MR ANGIO HEAD WO CONTRAST  Result Date: 03/07/2022 CLINICAL DATA:  Stroke, follow-up. EXAM: MRI HEAD WITHOUT CONTRAST MRA HEAD WITHOUT CONTRAST TECHNIQUE: Multiplanar, multi-echo pulse sequences of the brain and surrounding structures were acquired without intravenous contrast. Angiographic images of the Circle of Willis were acquired using MRA technique without intravenous contrast. COMPARISON:  Head CT March 07, 2022. FINDINGS: MRI HEAD FINDINGS Brain:  Scattered and confluent areas of restricted diffusion are seen in the bilateral cerebral and cerebellar hemispheres with the largest confluent areas involving the right temporoparietal occipital region and bilateral frontal lobes. Susceptibility artifact is noted in the sulci of the right is temporoparietal occipital region, bilateral occipital horns and along the tentorium, with distribution similar to prior CT, likely representing subarachnoid hemorrhage. No significant mass effect, intraparenchymal hemorrhage, hydrocephalus or mass lesion. Vascular: Normal flow voids. Skull and upper cervical spine: Normal marrow signal. Sinuses/Orbits: Mucosal thickening and bubbly secretion within the right maxillary sinus. Right mastoid effusion. The orbits are maintained. Other: None. MRA HEAD FINDINGS Anterior circulation: The visualized portions of the distal cervical and intracranial internal carotid arteries are widely patent with normal flow related enhancement. The bilateral anterior cerebral arteries and middle cerebral arteries are widely patent with anterograde flow without high-grade flow-limiting stenosis or proximal branch occlusion. No intracranial aneurysm within the anterior circulation. Posterior circulation: The vertebral arteries are widely patent with anterograde flow. Vertebrobasilar junction and basilar artery are widely patent with anterograde flow without evidence of basilar stenosis or aneurysm. Posterior cerebral arteries are normal bilaterally. No intracranial aneurysm within the posterior circulation. Anatomic variants: None significant. IMPRESSION: 1. Scattered and confluent areas of restricted diffusion in the bilateral cerebral and cerebellar hemispheres, consistent with acute infarcts, likely embolic. 2. Small amount of subarachnoid hemorrhage in the right temporoparietal occipital region, bilateral occipital horns and along the tentorium. 3. No evidence of high-grade flow-limiting stenosis or  proximal branch occlusion in the intracranial circulation. Electronically Signed   By: Pedro Earls M.D.   On: 03/07/2022 13:22   MR BRAIN WO CONTRAST  Result Date: 03/07/2022 CLINICAL DATA:  Stroke, follow-up. EXAM: MRI HEAD WITHOUT CONTRAST MRA HEAD WITHOUT CONTRAST TECHNIQUE: Multiplanar, multi-echo pulse sequences of the brain and surrounding structures were acquired without intravenous contrast. Angiographic images of the Circle of Willis were acquired using MRA technique without intravenous contrast. COMPARISON:  Head CT March 07, 2022. FINDINGS: MRI HEAD FINDINGS Brain: Scattered and confluent areas of restricted diffusion are seen in the bilateral cerebral and cerebellar hemispheres with the largest  confluent areas involving the right temporoparietal occipital region and bilateral frontal lobes. Susceptibility artifact is noted in the sulci of the right is temporoparietal occipital region, bilateral occipital horns and along the tentorium, with distribution similar to prior CT, likely representing subarachnoid hemorrhage. No significant mass effect, intraparenchymal hemorrhage, hydrocephalus or mass lesion. Vascular: Normal flow voids. Skull and upper cervical spine: Normal marrow signal. Sinuses/Orbits: Mucosal thickening and bubbly secretion within the right maxillary sinus. Right mastoid effusion. The orbits are maintained. Other: None. MRA HEAD FINDINGS Anterior circulation: The visualized portions of the distal cervical and intracranial internal carotid arteries are widely patent with normal flow related enhancement. The bilateral anterior cerebral arteries and middle cerebral arteries are widely patent with anterograde flow without high-grade flow-limiting stenosis or proximal branch occlusion. No intracranial aneurysm within the anterior circulation. Posterior circulation: The vertebral arteries are widely patent with anterograde flow. Vertebrobasilar junction and basilar  artery are widely patent with anterograde flow without evidence of basilar stenosis or aneurysm. Posterior cerebral arteries are normal bilaterally. No intracranial aneurysm within the posterior circulation. Anatomic variants: None significant. IMPRESSION: 1. Scattered and confluent areas of restricted diffusion in the bilateral cerebral and cerebellar hemispheres, consistent with acute infarcts, likely embolic. 2. Small amount of subarachnoid hemorrhage in the right temporoparietal occipital region, bilateral occipital horns and along the tentorium. 3. No evidence of high-grade flow-limiting stenosis or proximal branch occlusion in the intracranial circulation. Electronically Signed   By: Pedro Earls M.D.   On: 03/07/2022 13:22   ECHOCARDIOGRAM COMPLETE  Result Date: 03/07/2022    ECHOCARDIOGRAM REPORT   Patient Name:   REXANN MARCHESANI [facility] Date of Exam: 03/07/2022 Medical Rec #:  [id number]       Height:       63.0 in Accession #:    [id number]      Weight:       114.2 lb Date of Birth:  06-10-1945        BSA:          1.524 m Patient Age:    15 years        BP:           121/48 mmHg Patient Gender: F               HR:           69 bpm. Exam Location:  Inpatient Procedure: 2D Echo, Cardiac Doppler and Color Doppler Indications:    Stroke I63.9  History:        Patient has no prior history of Echocardiogram examinations.                 CAD, Stroke; Risk Factors:Hypertension and Dyslipidemia.  Sonographer:    Ronny Flurry Referring Phys: [id number] [place]  1. Left ventricular ejection fraction, by estimation, is 60 to 65%. The left ventricle has normal function. The left ventricle has no regional wall motion abnormalities. Left ventricular diastolic parameters were normal.  2. Right ventricular systolic function is normal. The right ventricular size is normal.  3. Moderate but not quite circumferential effusion with dilated IVC Mild diastolic RV collapse on 2D but no  variation in mitral inflow Suggest clinical correltation and repeat TTE in 2 weeks . Moderate pericardial effusion. The pericardial effusion is posterior to the left ventricle, lateral to the left ventricle and anterior to the right ventricle.  4. The mitral valve is abnormal. Mild mitral valve regurgitation. No evidence of mitral stenosis.  5. The aortic  valve is tricuspid. There is mild calcification of the aortic valve. There is mild thickening of the aortic valve. Aortic valve regurgitation is mild to moderate. Aortic valve sclerosis is present, with no evidence of aortic valve stenosis.  6. The inferior vena cava is dilated in size with >50% respiratory variability, suggesting right atrial pressure of 8 mmHg. FINDINGS  Left Ventricle: Left ventricular ejection fraction, by estimation, is 60 to 65%. The left ventricle has normal function. The left ventricle has no regional wall motion abnormalities. The left ventricular internal cavity size was normal in size. There is  no left ventricular hypertrophy. Left ventricular diastolic parameters were normal. Right Ventricle: The right ventricular size is normal. No increase in right ventricular wall thickness. Right ventricular systolic function is normal. Left Atrium: Left atrial size was normal in size. Right Atrium: Right atrial size was normal in size. Pericardium: Moderate but not quite circumferential effusion with dilated IVC Mild diastolic RV collapse on 2D but no variation in mitral inflow Suggest clinical correltation and repeat TTE in 2 weeks. A moderately sized pericardial effusion is present. The pericardial effusion is posterior to the left ventricle, lateral to the left ventricle and anterior to the right ventricle. Mitral Valve: The mitral valve is abnormal. There is mild thickening of the mitral valve leaflet(s). There is mild calcification of the mitral valve leaflet(s). Mild mitral valve regurgitation. No evidence of mitral valve stenosis. Tricuspid  Valve: The tricuspid valve is normal in structure. Tricuspid valve regurgitation is trivial. No evidence of tricuspid stenosis. Aortic Valve: The aortic valve is tricuspid. There is mild calcification of the aortic valve. There is mild thickening of the aortic valve. Aortic valve regurgitation is mild to moderate. Aortic regurgitation PHT measures 466 msec. Aortic valve sclerosis  is present, with no evidence of aortic valve stenosis. Aortic valve mean gradient measures 4.7 mmHg. Aortic valve peak gradient measures 8.9 mmHg. Aortic valve area, by VTI measures 2.33 cm. Pulmonic Valve: The pulmonic valve was normal in structure. Pulmonic valve regurgitation is not visualized. No evidence of pulmonic stenosis. Aorta: The aortic root is normal in size and structure. Venous: The inferior vena cava is dilated in size with greater than 50% respiratory variability, suggesting right atrial pressure of 8 mmHg. IAS/Shunts: No atrial level shunt detected by color flow Doppler.  LEFT VENTRICLE PLAX 2D LVIDd:         4.70 cm   Diastology LVIDs:         3.20 cm   LV e' medial:    7.72 cm/s LV PW:         0.90 cm   LV E/e' medial:  10.0 LV IVS:        0.80 cm   LV e' lateral:   9.68 cm/s LVOT diam:     1.90 cm   LV E/e' lateral: 8.0 LV SV:         64 LV SV Index:   42 LVOT Area:     2.84 cm  RIGHT VENTRICLE             IVC RV Basal diam:  3.10 cm     IVC diam: 2.10 cm RV S prime:     13.10 cm/s TAPSE (M-mode): 1.9 cm LEFT ATRIUM             Index        RIGHT ATRIUM           Index LA diam:  3.70 cm 2.43 cm/m   RA Area:     15.00 cm LA Vol (A2C):   47.6 ml 31.24 ml/m  RA Volume:   37.00 ml  24.28 ml/m LA Vol (A4C):   51.1 ml 33.53 ml/m LA Biplane Vol: 50.2 ml 32.94 ml/m  AORTIC VALVE AV Area (Vmax):    2.53 cm AV Area (Vmean):   2.18 cm AV Area (VTI):     2.33 cm AV Vmax:           149.33 cm/s AV Vmean:          98.633 cm/s AV VTI:            0.274 m AV Peak Grad:      8.9 mmHg AV Mean Grad:      4.7 mmHg LVOT  Vmax:         133.00 cm/s LVOT Vmean:        75.800 cm/s LVOT VTI:          0.225 m LVOT/AV VTI ratio: 0.82 AI PHT:            466 msec  AORTA Ao Root diam: 3.00 cm Ao Asc diam:  3.10 cm MITRAL VALVE MV Area (PHT): 4.40 cm    SHUNTS MV Decel Time: 173 msec    Systemic VTI:  0.22 m MR Peak grad: 103.0 mmHg   Systemic Diam: 1.90 cm MR Mean grad: 68.0 mmHg MR Vmax:      507.50 cm/s MR Vmean:     385.0 cm/s MV E velocity: 77.50 cm/s MV A velocity: 60.30 cm/s MV E/A ratio:  1.29 Jenkins Rouge MD Electronically signed by Jenkins Rouge MD Signature Date/Time: 03/07/2022/11:47:30 AM    Final     Cardiac Studies   Cardiac Studies & Procedures       ECHOCARDIOGRAM  ECHOCARDIOGRAM COMPLETE 03/07/2022  Narrative ECHOCARDIOGRAM REPORT    Patient Name:   DAIONA TOMEY [facility] Date of Exam: 03/07/2022 Medical Rec #:  [id number]       Height:       63.0 in Accession #:    [id number]      Weight:       114.2 lb Date of Birth:  [birth year]        BSA:          1.524 m Patient Age:    34 years        BP:           121/48 mmHg Patient Gender: F               HR:           69 bpm. Exam Location:  Inpatient  Procedure: 2D Echo, Cardiac Doppler and Color Doppler  Indications:    Stroke I63.9  History:        Patient has no prior history of Echocardiogram examinations. CAD, Stroke; Risk Factors:Hypertension and Dyslipidemia.  Sonographer:    Ronny Flurry Referring Phys: [id number] [place]   1. Left ventricular ejection fraction, by estimation, is 60 to 65%. The left ventricle has normal function. The left ventricle has no regional wall motion abnormalities. Left ventricular diastolic parameters were normal. 2. Right ventricular systolic function is normal. The right ventricular size is normal. 3. Moderate but not quite circumferential effusion with dilated IVC Mild diastolic RV collapse on 2D but no variation in mitral inflow Suggest clinical correltation and repeat TTE in 2 weeks .  Moderate pericardial effusion. The pericardial  effusion is posterior to the left ventricle, lateral to the left ventricle and anterior to the right ventricle. 4. The mitral valve is abnormal. Mild mitral valve regurgitation. No evidence of mitral stenosis. 5. The aortic valve is tricuspid. There is mild calcification of the aortic valve. There is mild thickening of the aortic valve. Aortic valve regurgitation is mild to moderate. Aortic valve sclerosis is present, with no evidence of aortic valve stenosis. 6. The inferior vena cava is dilated in size with >50% respiratory variability, suggesting right atrial pressure of 8 mmHg.  FINDINGS Left Ventricle: Left ventricular ejection fraction, by estimation, is 60 to 65%. The left ventricle has normal function. The left ventricle has no regional wall motion abnormalities. The left ventricular internal cavity size was normal in size. There is no left ventricular hypertrophy. Left ventricular diastolic parameters were normal.  Right Ventricle: The right ventricular size is normal. No increase in right ventricular wall thickness. Right ventricular systolic function is normal.  Left Atrium: Left atrial size was normal in size.  Right Atrium: Right atrial size was normal in size.  Pericardium: Moderate but not quite circumferential effusion with dilated IVC Mild diastolic RV collapse on 2D but no variation in mitral inflow Suggest clinical correltation and repeat TTE in 2 weeks. A moderately sized pericardial effusion is present. The pericardial effusion is posterior to the left ventricle, lateral to the left ventricle and anterior to the right ventricle.  Mitral Valve: The mitral valve is abnormal. There is mild thickening of the mitral valve leaflet(s). There is mild calcification of the mitral valve leaflet(s). Mild mitral valve regurgitation. No evidence of mitral valve stenosis.  Tricuspid Valve: The tricuspid valve is normal in structure. Tricuspid  valve regurgitation is trivial. No evidence of tricuspid stenosis.  Aortic Valve: The aortic valve is tricuspid. There is mild calcification of the aortic valve. There is mild thickening of the aortic valve. Aortic valve regurgitation is mild to moderate. Aortic regurgitation PHT measures 466 msec. Aortic valve sclerosis is present, with no evidence of aortic valve stenosis. Aortic valve mean gradient measures 4.7 mmHg. Aortic valve peak gradient measures 8.9 mmHg. Aortic valve area, by VTI measures 2.33 cm.  Pulmonic Valve: The pulmonic valve was normal in structure. Pulmonic valve regurgitation is not visualized. No evidence of pulmonic stenosis.  Aorta: The aortic root is normal in size and structure.  Venous: The inferior vena cava is dilated in size with greater than 50% respiratory variability, suggesting right atrial pressure of 8 mmHg.  IAS/Shunts: No atrial level shunt detected by color flow Doppler.   LEFT VENTRICLE PLAX 2D LVIDd:         4.70 cm   Diastology LVIDs:         3.20 cm   LV e' medial:    7.72 cm/s LV PW:         0.90 cm   LV E/e' medial:  10.0 LV IVS:        0.80 cm   LV e' lateral:   9.68 cm/s LVOT diam:     1.90 cm   LV E/e' lateral: 8.0 LV SV:         64 LV SV Index:   42 LVOT Area:     2.84 cm   RIGHT VENTRICLE             IVC RV Basal diam:  3.10 cm     IVC diam: 2.10 cm RV S prime:     13.10 cm/s TAPSE (  M-mode): 1.9 cm  LEFT ATRIUM             Index        RIGHT ATRIUM           Index LA diam:        3.70 cm 2.43 cm/m   RA Area:     15.00 cm LA Vol (A2C):   47.6 ml 31.24 ml/m  RA Volume:   37.00 ml  24.28 ml/m LA Vol (A4C):   51.1 ml 33.53 ml/m LA Biplane Vol: 50.2 ml 32.94 ml/m AORTIC VALVE AV Area (Vmax):    2.53 cm AV Area (Vmean):   2.18 cm AV Area (VTI):     2.33 cm AV Vmax:           149.33 cm/s AV Vmean:          98.633 cm/s AV VTI:            0.274 m AV Peak Grad:      8.9 mmHg AV Mean Grad:      4.7 mmHg LVOT Vmax:          133.00 cm/s LVOT Vmean:        75.800 cm/s LVOT VTI:          0.225 m LVOT/AV VTI ratio: 0.82 AI PHT:            466 msec  AORTA Ao Root diam: 3.00 cm Ao Asc diam:  3.10 cm  MITRAL VALVE MV Area (PHT): 4.40 cm    SHUNTS MV Decel Time: 173 msec    Systemic VTI:  0.22 m MR Peak grad: 103.0 mmHg   Systemic Diam: 1.90 cm MR Mean grad: 68.0 mmHg MR Vmax:      507.50 cm/s MR Vmean:     385.0 cm/s MV E velocity: 77.50 cm/s MV A velocity: 60.30 cm/s MV E/A ratio:  1.29  Charlton Haws MD Electronically signed by Charlton Haws MD Signature Date/Time: 03/07/2022/11:47:30 AM    Final              Patient Profile     76 y.o. female with embolic stroke  Assessment & Plan    Persistent Atrial Fibrillation Embolic stroke with hemorrhagic conversion; per chart review in the setting of DOAC non-adherence Anemia, worsening suspicion anemia of chronic disease HTN Qtc prolongation - with RVR - limited options; per chart review had palpitations with amiodarone and was on sotalol; husband and daughter do not know what she took - has had pressor requirement and RVR - we will plan for IV amiodarone; our goal is to be on amiodarone for a minimal amount of time (I suspect peri-admission) - AC start when [facility] per neurology (potentially Friday); she may need a TEE/DCCV once on therapy - I have consented daugther for this procedure if we need it ([name]) and this could potentially be needed on Monday - worsening anemia is concerning as that could preclude DCCT - pressors stopped, we will see if this persisters  Moderate pericardial effusion with out tamponade Moderate AI - limited echo in ~ 2 weeks  HLD - continue statin  CRITICAL CARE Performed by: Ezana Hubbert A Taleya Whitcher  Total critical care time: 45 minutes. Critical care time was exclusive of separately billable procedures and treating other patients. Critical care was necessary to treat or prevent imminent or life-threatening  deterioration. Critical care was time spent personally by me on the following activities: development of treatment plan with patient and/or surrogate as  well as nursing, discussions with consultants, evaluation of patient's response to treatment, examination of patient, obtaining history from patient or surrogate, ordering and performing treatments and interventions, ordering and review of laboratory studies, ordering and review of radiographic studies, pulse oximetry and re-evaluation of patient's condition.   For questions or updates, please contact [facility] Please consult [email or web address] for contact info under Cardiology/STEMI.      Rudean Haskell, MD [address] [place], [place] [address] [phone]  8:15 AM   She has had hemodynamic instability with AF RVR.  Unable to DCCV due to no AC. Discussed with Dr. Lake Bells and Erlinda Hong. Discussed heparin then urgent TEE/DCCV. Discussed traditional risks then unique risk including both hemorrhagic and embolick stroke risk  There are limited options. Daugthers (tammy was called) has given consent for TEE/DCCV.  Heparin has been started.  Rudean Haskell, MD [facility] [address] [place], [place] [address] [phone]  3:00 PM     TRANSESOPHAGEAL ECHOCARDIOGRAM GUIDED DIRECT CURRENT CARDIOVERSION  NAME:  GALILEA RECLA    MRN: [id number] DOB:  [birth year]    ADMIT DATE: 03/24/2022  INDICATIONS: Symptomatic atrial fibrillation with hypotension  PROCEDURE:   Informed consent was obtained prior to the procedure. The risks, benefits and alternatives for the procedure were discussed and the patient comprehended these risks.  Risks include, but are not limited to, cough, sore throat, vomiting, nausea, somnolence, esophageal and stomach trauma or perforation, bleeding, low blood pressure, aspiration,  pneumonia, infection, trauma to the teeth and death.    After a procedural time-out, the oropharynx was anesthetized and the patient was sedated by the anesthesia service. The transesophageal probe was inserted in the esophagus and stomach without difficulty and multiple views were obtained.  I gave moderate sedation and of 2 mg ativan and 25 mcg fentanyl.  Had baseline pressors and precedex with no change.  Blood pressure want monitored every three minutes.  No hypotension  COMPLICATIONS:    Complications: No complications Patient tolerated procedure well.  KEY FINDINGS:  Patent left atrial appendage. Full Report to follow.   CARDIOVERSION:     Indications:  Symptomatic Atrial Fibrillation  Procedure Details:  Once the TEE was complete, the patient had the defibrillator pads placed in the anterior and posterior position. Once an appropriate level of sedation was confirmed, the patient was cardioverted x 2 with 200J of biphasic synchronized energy.  The patient converted to NSR but went back in to atrial fibrillation twice.  There were no apparent complications.  The patient had normal neuro status and respiratory status post procedure with vitals stable as recorded elsewhere.  Adequate airway was maintained throughout and vital signs monitored per protocol.  Rudean Haskell, MD [place]  4:08 PM  Discussed care with PCCM  and with family. Continue diltiazem and amiodarone. The fact that patient has converted spontaneously and with DCCV is optimistic that she may hold with improvement of her PNA. Will continue to follow.   CRITICAL CARE Performed by: [place] Droll A Emalea Mix  Total critical care time: 60 minutes  minutes. Critical care time was exclusive of separately billable procedures and treating other patients. Critical care was necessary to treat or prevent imminent or life-threatening deterioration. Critical care was time spent personally by me on the  following activities: development of treatment plan with patient and/or surrogate as well as nursing, discussions  with consultants, evaluation of patient's response to treatment, examination of patient, obtaining history from patient or surrogate, ordering and performing treatments and interventions, ordering and review of laboratory studies, ordering and review of radiographic studies, pulse oximetry and re-evaluation of patient's condition.    Signed, Rudean Haskell, [place]  03/09/2022 4:19 PM

## 2022-03-09 NOTE — Progress Notes (Signed)
Attending:    Subjective: Having problems with atrial fibrillation with RVR again More awake today Did not pass SBT due to high RR, inability to hold head off of pillow for sustained amount of time  Objective: Vitals:   03/09/22 1055 03/09/22 1100 03/09/22 1200 03/09/22 1300  BP: (!) 121/57 (!) 140/90 131/81 117/63  Pulse: (!) 149 (!) 168 (!) 145 (!) 145  Resp: (!) 26 (!) 27 (!) 30 20  Temp:      TempSrc:      SpO2: 99% 100% 99% 99%  Weight:      Height:       Vent Mode: PRVC FiO2 (%):  [40 %] 40 % Set Rate:  [16 bmp] 16 bmp Vt Set:  [410 mL] 410 mL PEEP:  [5 cmH20] 5 cmH20 Pressure Support:  [5 cmH20-8 cmH20] 8 cmH20 Plateau Pressure:  [11 cmH20-19 cmH20] 19 cmH20  Intake/Output Summary (Last 24 hours) at 03/09/2022 1401 Last data filed at 03/09/2022 1300 Gross per 24 hour  Intake 1414.02 ml  Output 590 ml  Net 824.02 ml    General:  In bed on vent HENT: NCAT ETT in place PULM: CTA B, vent supported breathing, tachypneic on pressure support ventilation CV: Irreg irreg, tachycardic, no mgr GI: BS+, soft, nontender MSK: normal bulk and tone Neuro: awake on vent, nods head, follows commands, generally very weak    CBC    Component Value Date/Time   WBC 9.1 03/09/2022 0605   RBC 3.26 (L) 03/09/2022 0605   HGB 9.7 (L) 03/09/2022 0605   HGB 12.3 12/30/2011 0952   HCT 29.4 (L) 03/09/2022 0605   HCT 37.5 12/30/2011 0952   PLT 95 (L) 03/09/2022 0605   PLT 168 12/30/2011 0952   MCV 90.2 03/09/2022 0605   MCV 88 12/30/2011 0952   MCH 29.8 03/09/2022 0605   MCHC 33.0 03/09/2022 0605   RDW 13.5 03/09/2022 0605   RDW 13.4 12/30/2011 0952   LYMPHSABS 1.7 03/12/2022 1500   LYMPHSABS 1.2 12/30/2011 0952   MONOABS 1.0 03/19/2022 1500   MONOABS 0.4 12/30/2011 0952   EOSABS 0.1 02/24/2022 1500   EOSABS 0.1 12/30/2011 0952   BASOSABS 0.1 03/24/2022 1500   BASOSABS 0.1 12/30/2011 0952    BMET    Component Value Date/Time   NA 141 03/09/2022 0605   K 4.1  03/09/2022 0605   CL 114 (H) 03/09/2022 0605   CO2 19 (L) 03/09/2022 0605   GLUCOSE 110 (H) 03/09/2022 0605   BUN 11 03/09/2022 0605   CREATININE 0.58 03/09/2022 0605   CALCIUM 8.3 (L) 03/09/2022 0605   GFRNONAA >60 03/09/2022 6962     Impression/Plan: Atrial fibrillation with RVR> high risk situation because A-fib with RVR is preventing her from getting extubated (in addition to generalized weakness) and causing shock, but anticoagulation and cardioversion both carry risks.  At this time given the severity of her RVR and the life threatening effects it's causing it seems the best approach is to proceed with cardioversion, have discussed with cardiology and neurology who agree.  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  Some improvement in neuro status today, but confounding medical problems (resp failure, pneumonia, afib) making likelihood of liberation from mechanical ventilation difficult.  Monitor neuro status closely, if doesn't improve more in next 2-3 days  will need to discuss possibility of tracheostomy vs comfort measures.  My cc time 40 minutes  Roselie Awkward, MD [place] [facility] Pager: [phone] Cell: [phone] After 7pm: [phone]

## 2022-03-10 DIAGNOSIS — J9621 Acute and chronic respiratory failure with hypoxia: Secondary | ICD-10-CM | POA: Diagnosis not present

## 2022-03-10 DIAGNOSIS — I4819 Other persistent atrial fibrillation: Secondary | ICD-10-CM | POA: Diagnosis not present

## 2022-03-10 DIAGNOSIS — J9622 Acute and chronic respiratory failure with hypercapnia: Secondary | ICD-10-CM | POA: Diagnosis not present

## 2022-03-10 DIAGNOSIS — I4891 Unspecified atrial fibrillation: Secondary | ICD-10-CM | POA: Diagnosis not present

## 2022-03-10 DIAGNOSIS — I63511 Cerebral infarction due to unspecified occlusion or stenosis of right middle cerebral artery: Secondary | ICD-10-CM | POA: Diagnosis not present

## 2022-03-10 LAB — CBC
HCT: 29.8 % — ABNORMAL LOW (ref 36.0–46.0)
Hemoglobin: 10 g/dL — ABNORMAL LOW (ref 12.0–15.0)
MCH: 29.2 pg (ref 26.0–34.0)
MCHC: 33.6 g/dL (ref 30.0–36.0)
MCV: 86.9 fL (ref 80.0–100.0)
Platelets: 142 10*3/uL — ABNORMAL LOW (ref 150–400)
RBC: 3.43 MIL/uL — ABNORMAL LOW (ref 3.87–5.11)
RDW: 13.8 % (ref 11.5–15.5)
WBC: 11.6 10*3/uL — ABNORMAL HIGH (ref 4.0–10.5)
nRBC: 0 % (ref 0.0–0.2)

## 2022-03-10 LAB — MAGNESIUM: Magnesium: 1.8 mg/dL (ref 1.7–2.4)

## 2022-03-10 LAB — BASIC METABOLIC PANEL
Anion gap: 9 (ref 5–15)
BUN: 13 mg/dL (ref 8–23)
CO2: 20 mmol/L — ABNORMAL LOW (ref 22–32)
Calcium: 8.1 mg/dL — ABNORMAL LOW (ref 8.9–10.3)
Chloride: 110 mmol/L (ref 98–111)
Creatinine, Ser: 0.58 mg/dL (ref 0.44–1.00)
GFR, Estimated: >60 mL/min (ref >60)
Glucose, Bld: 153 mg/dL — ABNORMAL HIGH (ref 70–99)
Potassium: 3.9 mmol/L (ref 3.5–5.1)
Sodium: 139 mmol/L (ref 135–145)

## 2022-03-10 LAB — HEPARIN LEVEL (UNFRACTIONATED)
Heparin Unfractionated: 0.26 IU/mL — ABNORMAL LOW (ref 0.30–0.70)
Heparin Unfractionated: 0.29 IU/mL — ABNORMAL LOW (ref 0.30–0.70)

## 2022-03-10 LAB — GLUCOSE, CAPILLARY
Glucose-Capillary: 139 mg/dL — ABNORMAL HIGH (ref 70–99)
Glucose-Capillary: 156 mg/dL — ABNORMAL HIGH (ref 70–99)
Glucose-Capillary: 160 mg/dL — ABNORMAL HIGH (ref 70–99)
Glucose-Capillary: 143 mg/dL — ABNORMAL HIGH (ref 70–99)
Glucose-Capillary: 158 mg/dL — ABNORMAL HIGH (ref 70–99)
Glucose-Capillary: 167 mg/dL — ABNORMAL HIGH (ref 70–99)

## 2022-03-10 LAB — PHOSPHORUS: Phosphorus: 2.5 mg/dL (ref 2.5–4.6)

## 2022-03-10 LAB — APTT: aPTT: 67 seconds — ABNORMAL HIGH (ref 24–36)

## 2022-03-10 MED ORDER — FUROSEMIDE 10 MG/ML IJ SOLN
40.0000 mg | Freq: Once | INTRAMUSCULAR | Status: AC
  Administered 2022-03-10: 40 mg via INTRAVENOUS
  Filled 2022-03-10: qty 4

## 2022-03-10 NOTE — Progress Notes (Signed)
SLP Cancellation Note  Patient Details Name: Sherry Olson MRN: [id number] DOB: 1946-03-10   Cancelled treatment:       Reason Eval/Treat Not Completed: Patient not medically ready (remains on vent). Will f/u as able.    Mahala Menghini., M.A. CCC-SLP Acute Rehabilitation Services Office [phone]  Secure chat preferred  03/10/2022, 7:52 AM

## 2022-03-10 NOTE — Progress Notes (Signed)
ANTICOAGULATION CONSULT NOTE - Initial Consult  Pharmacy Consult:  Heparin Indication: atrial fibrillation  Allergies  Allergen Reactions   Amiodarone Palpitations    "Heart trouble" per patient's daughter and husband.   Colesevelam Nausea And Vomiting   Welchol [Colesevelam Hcl] Diarrhea and Nausea And Vomiting    Patient Measurements: Height: 5\' 3"  (160 cm) Weight: 61.5 kg (135 lb 9.3 oz) IBW/kg (Calculated) : 52.4 Heparin Dosing Weight: 61 kg  Vital Signs: Temp: 99.8 F (37.7 C) (11/16 2000) Temp Source: Oral (11/16 2000) BP: 123/54 (11/16 2000) Pulse Rate: 73 (11/16 2000)  Labs: Recent Labs    03/08/22 0453 03/08/22 0810 03/08/22 0810 03/09/22 0605 03/09/22 1740 03/09/22 2254 03/10/22 0645 03/10/22 1046 03/10/22 1945  HGB  --  10.7*   < > 9.7*  --   --  10.0*  --   --   HCT  --  34.6*  --  29.4*  --   --  29.8*  --   --   PLT  --  105*  --  95*  --   --  142*  --   --   APTT  --   --   --   --   --  67*  --   --   --   LABPROT 17.7*  --   --   --  15.4*  --   --   --   --   INR 1.5*  --   --   --  1.2  --   --   --   --   HEPARINUNFRC  --   --   --   --   --  0.45  --  0.26* 0.29*  CREATININE 0.66  --   --  0.58  --   --  0.58  --   --    < > = values in this interval not displayed.     Estimated Creatinine Clearance: 48.7 mL/min (by C-G formula based on SCr of 0.58 mg/dL).   Medical History: Past Medical History:  Diagnosis Date   Coronary artery disease    DVT (deep venous thrombosis) (HCC)    HLD (hyperlipidemia)    Hypertension     Assessment: 7 YOF with Afib on Eliquis PTA, last dose unknown.  Patient has an acute stroke with a small SAH s/p revascularization on 11/12.  She has been in Afib RVR and pharmacy consulted to dose IV heparin for planned cardioversion. Renal function stable. D/w neuro-okay to continue heparin drip.  CBC: Hgb 10.0/ Plt1 42 (stable)  -no issues with infusion/line and no s/sx bleeding per RN.   HL 0.29, increase  rate and check in AM.  Goal of Therapy:  Heparin level 0.3-0.5 units/ml Monitor platelets by anticoagulation protocol: Yes   Plan:  Increase heparin infusion to 900 units/hr - no boluses with SAH Check AM heparin level  Daily heparin level and CBC  13/12, PharmD, BCIDP, AAHIVP, CPP Infectious Disease Pharmacist 03/10/2022 9:09 PM

## 2022-03-10 NOTE — Progress Notes (Signed)
OT Cancellation Note  Patient Details Name: Sherry Olson MRN: [id number] DOB: March 27, 1946   Cancelled Treatment:    Reason Eval/Treat Not Completed: Other (comment). Pt weaning of vent. Will check back tomorrow.   Annette Liotta,HILLARY 03/10/2022, 2:25 PM Luisa Dago, OT/L   Acute OT Clinical Specialist Acute Rehabilitation Services Pager [phone] Office [phone]

## 2022-03-10 NOTE — Progress Notes (Signed)
ANTICOAGULATION CONSULT NOTE  Pharmacy Consult:  Heparin Indication: atrial fibrillation Brief A/P: Heparin level within goal range Continue Heparin at current rate   Allergies  Allergen Reactions   Amiodarone Palpitations    "Heart trouble" per patient's daughter and husband.   Colesevelam Nausea And Vomiting   Welchol [Colesevelam Hcl] Diarrhea and Nausea And Vomiting    Patient Measurements: Height: 5\' 3"  (160 cm) Weight: 61.4 kg (135 lb 5.8 oz) IBW/kg (Calculated) : 52.4 Heparin Dosing Weight: 61 kg  Vital Signs: Temp: 98.6 F (37 C) (11/16 0000) Temp Source: Axillary (11/16 0000) BP: 117/48 (11/16 0000) Pulse Rate: 79 (11/16 0000)  Labs: Recent Labs    03/07/22 2344 03/08/22 0453 03/08/22 0810 03/09/22 0605 03/09/22 1740 03/09/22 2254  HGB  --   --  10.7* 9.7*  --   --   HCT  --   --  34.6* 29.4*  --   --   PLT  --   --  105* 95*  --   --   APTT  --   --   --   --   --  67*  LABPROT  --  17.7*  --   --  15.4*  --   INR  --  1.5*  --   --  1.2  --   HEPARINUNFRC  --   --   --   --   --  0.45  CREATININE 0.72 0.66  --  0.58  --   --      Estimated Creatinine Clearance: 48.7 mL/min (by C-G formula based on SCr of 0.58 mg/dL).  Assessment: 76 yo female with h/o Afib, Eliquis on hold for heparin  Goal of Therapy:  Heparin level 0.3-0.5 units/ml aPTT 66-85 seconds Monitor platelets by anticoagulation protocol: Yes   Plan:  Continue Heparin at current rate  Check heparin level in 8 hours.   62, PharmD, BCPS  03/10/2022, 12:40 AM

## 2022-03-10 NOTE — Progress Notes (Signed)
Progress Note  Patient Name: Sherry Olson Date of Encounter: 03/10/2022  Primary Cardiologist: None   Subjective   Overnight queries of cuff leak.  Had active afternoon 11/15 including emergent, high risk TEE/DCCV (see prior note). Has since converted to sinus rhythm with spikes of atrial fibrillation Intubated and sedated.  Inpatient Medications    Scheduled Meds:  atorvastatin  10 mg Per Tube Daily   Chlorhexidine Gluconate Cloth  6 each Topical Daily   docusate  100 mg Per Tube BID   mouth rinse  15 mL Mouth Rinse Q2H   pantoprazole (PROTONIX) IV  40 mg Intravenous QHS   polyethylene glycol  17 g Per Tube Daily   sodium chloride flush  3 mL Intravenous Once   Continuous Infusions:  sodium chloride 500 mL (03/10/22 0610)   sodium chloride     amiodarone 30 mg/hr (03/10/22 0600)   azithromycin 250 mL/hr at 03/09/22 2200   cefTRIAXone (ROCEPHIN)  IV Stopped (03/09/22 1304)   dexmedetomidine (PRECEDEX) IV infusion 0.8 mcg/kg/hr (03/10/22 0617)   diltiazem (CARDIZEM) infusion 10 mg/hr (03/10/22 0600)   feeding supplement (PEPTAMEN 1.5 CAL) 1,000 mL (03/10/22 0739)   heparin 700 Units/hr (03/10/22 0600)   phenylephrine (NEO-SYNEPHRINE) Adult infusion 30 mcg/min (03/10/22 0618)   PRN Meds: acetaminophen **OR** acetaminophen (TYLENOL) oral liquid 160 mg/5 mL **OR** acetaminophen, fentaNYL (SUBLIMAZE) injection, LORazepam, LORazepam, mouth rinse, senna-docusate   Vital Signs    Vitals:   03/10/22 0616 03/10/22 0630 03/10/22 0700 03/10/22 0759  BP: (!) 108/45 (!) 109/49 (!) 120/44   Pulse: 71 71 (!) 59   Resp: (!) 22 (!) 21 20   Temp:    99.1 F (37.3 C)  TempSrc:    Axillary  SpO2: 98% 98% 98%   Weight:      Height:        Intake/Output Summary (Last 24 hours) at 03/10/2022 0801 Last data filed at 03/10/2022 0600 Gross per 24 hour  Intake 1862.11 ml  Output 437 ml  Net 1425.11 ml   Filed Weights   03/19/2022 1400 03/09/22 0500 03/10/22 0500  Weight:  51.8 kg 61.4 kg 61.5 kg    Telemetry    Sinus rhythm with spikes of A fib - Personally Reviewed  Physical Exam   Gen: intubated and sedated Cardiac: No Rubs or Gallops, systolic and diastolic murmur, RRR  Respiratory: Mechanical breath sounds GI: Soft, nontender, non-distended  Integument: Skin feels warm  Labs    Chemistry Recent Labs  Lab 03/21/2022 1500 03/20/2022 1505 03/07/22 0547 03/07/22 2344 03/08/22 0453 03/09/22 0605 03/10/22 0645  NA 140   < > 141   < > 143 141 139  K 4.0   < > 4.3   < > 4.4 4.1 3.9  CL 101   < > 110   < > 115* 114* 110  CO2 25   < > 20*   < > 20* 19* 20*  GLUCOSE 108*   < > 107*   < > 97 110* 153*  BUN 18   < > 14   < > 10 11 13   CREATININE 0.87   < > 0.72   < > 0.66 0.58 0.58  CALCIUM 9.4   < > 7.8*   < > 8.1* 8.3* 8.1*  PROT 6.4*  --  4.4*  --   --  4.9*  --   ALBUMIN 3.5  --  2.5*  --   --  2.3*  --   AST  60*  --  45*  --   --  29  --   ALT 23  --  19  --   --  16  --   ALKPHOS 61  --  44  --   --  49  --   BILITOT 1.1  --  0.5  --   --  0.4  --   GFRNONAA >60   < > >60   < > >60 >60 >60  ANIONGAP 14   < > 11   < > 8 8 9    < > = values in this interval not displayed.     Hematology Recent Labs  Lab 03/08/22 0810 03/09/22 0605 03/10/22 0645  WBC 13.5* 9.1 11.6*  RBC 3.67* 3.26* 3.43*  HGB 10.7* 9.7* 10.0*  HCT 34.6* 29.4* 29.8*  MCV 94.3 90.2 86.9  MCH 29.2 29.8 29.2  MCHC 30.9 33.0 33.6  RDW 14.1 13.5 13.8  PLT 105* 95* 142*    Cardiac EnzymesNo results for input(s): "TROPONINI" in the last 168 hours. No results for input(s): "TROPIPOC" in the last 168 hours.   BNPNo results for input(s): "BNP", "PROBNP" in the last 168 hours.   DDimer No results for input(s): "DDIMER" in the last 168 hours.   Radiology    DG CHEST PORT 1 VIEW  Result Date: 03/09/2022 CLINICAL DATA:  Intubated EXAM: PORTABLE CHEST 1 VIEW COMPARISON:  03/23/2022 FINDINGS: Single frontal view of the chest demonstrates endotracheal tube overlying  tracheal air column tip at thoracic inlet. Enteric catheter passes below diaphragm tip excluded by collimation. The cardiac silhouette is enlarged but stable. There is progressive left-sided airspace disease consistent with pneumonia. Small left effusion at the left lung base. Right chest is clear. No pneumothorax. IMPRESSION: 1. Progressive left-sided bronchopneumonia with small left parapneumonic effusion. 2. Support devices as above. Electronically Signed   By: Randa Ngo M.D.   On: 03/09/2022 21:22   ECHO TEE  Result Date: 03/09/2022    TRANSESOPHOGEAL ECHO REPORT   Patient Name:   Sherry Olson [facility] Date of Exam: 03/09/2022 Medical Rec #:  [id number]       Height:       63.0 in Accession #:    [id number]      Weight:       135.4 lb Date of Birth:  09-06-1945        BSA:          1.638 m Patient Age:    76 years        BP:           125/68 mmHg Patient Gender: F               HR:           83 bpm. Exam Location:  Inpatient Procedure: Transesophageal Echo, Cardiac Doppler and Color Doppler Indications:     Afib  History:         Patient has prior history of Echocardiogram examinations, most                  recent 03/07/2022. CAD; Risk Factors:Hypertension and                  Dyslipidemia.  Sonographer:     Darlina Sicilian RDCS Referring Phys:  [id number] [facility] A Gasper Sells Diagnosing Phys: Rudean Haskell MD PROCEDURE: After discussion of the risks and benefits of a TEE, an informed consent was obtained from a family member. The patient was  intubated. TEE procedure time was 6 minutes. The transesophogeal probe was passed without difficulty through the esophogus of the patient. Imaged were obtained with the patient in a supine position. Sedation performed by performing physician. Patients was under conscious sedation during this procedure. Image quality was good. The patient's vital signs; including heart rate, blood pressure, and oxygen saturation; remained stable throughout the procedure. The patient  developed no complications during the procedure. A direct current cardioversion was performed.  IMPRESSIONS  1. Left ventricular ejection fraction, by estimation, is 55 to 60%. The left ventricle has normal function.  2. Right ventricular systolic function is normal. The right ventricular size is normal.  3. No left atrial/left atrial appendage thrombus was detected.  4. Moderate pericardial effusion. There is no evidence of cardiac tamponade. Large pleural effusion in the left lateral region.  5. The mitral valve is grossly normal. At least moderate central mitral valve regurgitation. No evidence of mitral stenosis. R-R variability, limited images in the setting of emerent TEE/DCCV  6. The aortic valve is tricuspid. Aortic valve regurgitation is at least moderate. No holodiastolic flow verseral. R-R variability. No aortic stenosis is present.  7. There is mild (Grade II) plaque involving the descending aorta and ascending aorta. FINDINGS  Left Ventricle: Left ventricular ejection fraction, by estimation, is 55 to 60%. The left ventricle has normal function. The left ventricular internal cavity size was normal in size. Right Ventricle: The right ventricular size is normal. No increase in right ventricular wall thickness. Right ventricular systolic function is normal. Left Atrium: Left atrial size was normal in size. No left atrial/left atrial appendage thrombus was detected. Right Atrium: Right atrial size was normal in size. Pericardium: A moderately sized pericardial effusion is present. There is no evidence of cardiac tamponade. Mitral Valve: The mitral valve is grossly normal. Moderate mitral valve regurgitation. No evidence of mitral valve stenosis. Tricuspid Valve: The tricuspid valve is normal in structure. Tricuspid valve regurgitation is trivial. No evidence of tricuspid stenosis. Aortic Valve: The aortic valve is tricuspid. Aortic valve regurgitation is moderate. No aortic stenosis is present. Pulmonic  Valve: The pulmonic valve was not well visualized. Pulmonic valve regurgitation is not visualized. Aorta: The aortic root, ascending aorta and aortic arch are all structurally normal, with no evidence of dilitation or obstruction. There is mild (Grade II) plaque involving the descending aorta and ascending aorta. IAS/Shunts: The atrial septum is grossly normal. Additional Comments: There is a large pleural effusion in the left lateral region. Spectral Doppler performed. Rudean Haskell MD Electronically signed by Rudean Haskell MD Signature Date/Time: 03/09/2022/6:37:44 PM    Final    DG Abd Portable 1V  Result Date: 03/09/2022 CLINICAL DATA:  Feeding tube placement. EXAM: PORTABLE ABDOMEN - 1 VIEW COMPARISON:  Radiographs earlier the same date and 03/14/2022. CT 10/27/2010. FINDINGS: 1635 hours. The tip of the feeding tube is unchanged from the earlier study, projecting over the left iliac crest and likely in the distal body of the stomach. The visualized bowel gas pattern is nonobstructive. Persistent left lower lobe airspace disease with probable left pleural effusion. IMPRESSION: Unchanged position of the feeding tube, tip likely in the distal body of the stomach. Electronically Signed   By: Richardean Sale M.D.   On: 03/09/2022 16:56   DG Abd Portable 1V  Result Date: 03/09/2022 CLINICAL DATA:  Feeding tube placement EXAM: PORTABLE ABDOMEN - 1 VIEW COMPARISON:  Abdomen 03/22/2022 FINDINGS: Feeding tube tip is in the distal body of the stomach. NG tube  is been removed since the prior study. Nonobstructive bowel gas pattern. Gas in nondilated transverse colon. Surgical clips in the right upper quadrant. Progression of left lower lobe airspace disease. IMPRESSION: Feeding tube tip in the distal body of the stomach. Progression of left lower lobe airspace disease. Electronically Signed   By: Franchot Gallo M.D.   On: 03/09/2022 11:33    Cardiac Studies  Cardiac Studies & Procedures        ECHOCARDIOGRAM  ECHOCARDIOGRAM COMPLETE 03/07/2022  Narrative ECHOCARDIOGRAM REPORT    Patient Name:   BRYAH LIFFICK [facility] Date of Exam: 03/07/2022 Medical Rec #:  [id number]       Height:       63.0 in Accession #:    [id number]      Weight:       114.2 lb Date of Birth:  Feb 22, 1946        BSA:          1.524 m Patient Age:    89 years        BP:           121/48 mmHg Patient Gender: F               HR:           69 bpm. Exam Location:  Inpatient  Procedure: 2D Echo, Cardiac Doppler and Color Doppler  Indications:    Stroke I63.9  History:        Patient has no prior history of Echocardiogram examinations. CAD, Stroke; Risk Factors:Hypertension and Dyslipidemia.  Sonographer:    Ronny Flurry Referring Phys: [id number] [place]   1. Left ventricular ejection fraction, by estimation, is 60 to 65%. The left ventricle has normal function. The left ventricle has no regional wall motion abnormalities. Left ventricular diastolic parameters were normal. 2. Right ventricular systolic function is normal. The right ventricular size is normal. 3. Moderate but not quite circumferential effusion with dilated IVC Mild diastolic RV collapse on 2D but no variation in mitral inflow Suggest clinical correltation and repeat TTE in 2 weeks . Moderate pericardial effusion. The pericardial effusion is posterior to the left ventricle, lateral to the left ventricle and anterior to the right ventricle. 4. The mitral valve is abnormal. Mild mitral valve regurgitation. No evidence of mitral stenosis. 5. The aortic valve is tricuspid. There is mild calcification of the aortic valve. There is mild thickening of the aortic valve. Aortic valve regurgitation is mild to moderate. Aortic valve sclerosis is present, with no evidence of aortic valve stenosis. 6. The inferior vena cava is dilated in size with >50% respiratory variability, suggesting right atrial pressure of 8  mmHg.  FINDINGS Left Ventricle: Left ventricular ejection fraction, by estimation, is 60 to 65%. The left ventricle has normal function. The left ventricle has no regional wall motion abnormalities. The left ventricular internal cavity size was normal in size. There is no left ventricular hypertrophy. Left ventricular diastolic parameters were normal.  Right Ventricle: The right ventricular size is normal. No increase in right ventricular wall thickness. Right ventricular systolic function is normal.  Left Atrium: Left atrial size was normal in size.  Right Atrium: Right atrial size was normal in size.  Pericardium: Moderate but not quite circumferential effusion with dilated IVC Mild diastolic RV collapse on 2D but no variation in mitral inflow Suggest clinical correltation and repeat TTE in 2 weeks. A moderately sized pericardial effusion is present. The pericardial effusion is posterior to the left ventricle,  lateral to the left ventricle and anterior to the right ventricle.  Mitral Valve: The mitral valve is abnormal. There is mild thickening of the mitral valve leaflet(s). There is mild calcification of the mitral valve leaflet(s). Mild mitral valve regurgitation. No evidence of mitral valve stenosis.  Tricuspid Valve: The tricuspid valve is normal in structure. Tricuspid valve regurgitation is trivial. No evidence of tricuspid stenosis.  Aortic Valve: The aortic valve is tricuspid. There is mild calcification of the aortic valve. There is mild thickening of the aortic valve. Aortic valve regurgitation is mild to moderate. Aortic regurgitation PHT measures 466 msec. Aortic valve sclerosis is present, with no evidence of aortic valve stenosis. Aortic valve mean gradient measures 4.7 mmHg. Aortic valve peak gradient measures 8.9 mmHg. Aortic valve area, by VTI measures 2.33 cm.  Pulmonic Valve: The pulmonic valve was normal in structure. Pulmonic valve regurgitation is not visualized. No  evidence of pulmonic stenosis.  Aorta: The aortic root is normal in size and structure.  Venous: The inferior vena cava is dilated in size with greater than 50% respiratory variability, suggesting right atrial pressure of 8 mmHg.  IAS/Shunts: No atrial level shunt detected by color flow Doppler.   LEFT VENTRICLE PLAX 2D LVIDd:         4.70 cm   Diastology LVIDs:         3.20 cm   LV e' medial:    7.72 cm/s LV PW:         0.90 cm   LV E/e' medial:  10.0 LV IVS:        0.80 cm   LV e' lateral:   9.68 cm/s LVOT diam:     1.90 cm   LV E/e' lateral: 8.0 LV SV:         64 LV SV Index:   42 LVOT Area:     2.84 cm   RIGHT VENTRICLE             IVC RV Basal diam:  3.10 cm     IVC diam: 2.10 cm RV S prime:     13.10 cm/s TAPSE (M-mode): 1.9 cm  LEFT ATRIUM             Index        RIGHT ATRIUM           Index LA diam:        3.70 cm 2.43 cm/m   RA Area:     15.00 cm LA Vol (A2C):   47.6 ml 31.24 ml/m  RA Volume:   37.00 ml  24.28 ml/m LA Vol (A4C):   51.1 ml 33.53 ml/m LA Biplane Vol: 50.2 ml 32.94 ml/m AORTIC VALVE AV Area (Vmax):    2.53 cm AV Area (Vmean):   2.18 cm AV Area (VTI):     2.33 cm AV Vmax:           149.33 cm/s AV Vmean:          98.633 cm/s AV VTI:            0.274 m AV Peak Grad:      8.9 mmHg AV Mean Grad:      4.7 mmHg LVOT Vmax:         133.00 cm/s LVOT Vmean:        75.800 cm/s LVOT VTI:          0.225 m LVOT/AV VTI ratio: 0.82 AI PHT:  466 msec  AORTA Ao Root diam: 3.00 cm Ao Asc diam:  3.10 cm  MITRAL VALVE MV Area (PHT): 4.40 cm    SHUNTS MV Decel Time: 173 msec    Systemic VTI:  0.22 m MR Peak grad: 103.0 mmHg   Systemic Diam: 1.90 cm MR Mean grad: 68.0 mmHg MR Vmax:      507.50 cm/s MR Vmean:     385.0 cm/s MV E velocity: 77.50 cm/s MV A velocity: 60.30 cm/s MV E/A ratio:  1.29  Charlton Haws MD Electronically signed by Charlton Haws MD Signature Date/Time: 03/07/2022/11:47:30 AM    Final   TEE  ECHO TEE  03/09/2022  Narrative TRANSESOPHOGEAL ECHO REPORT    Patient Name:   Sherry Olson [facility] Date of Exam: 03/09/2022 Medical Rec #:  [id number]       Height:       63.0 in Accession #:    [id number]      Weight:       135.4 lb Date of Birth:  1945/12/28        BSA:          1.638 m Patient Age:    76 years        BP:           125/68 mmHg Patient Gender: F               HR:           83 bpm. Exam Location:  Inpatient  Procedure: Transesophageal Echo, Cardiac Doppler and Color Doppler  Indications:     Afib  History:         Patient has prior history of Echocardiogram examinations, most recent 03/07/2022. CAD; Risk Factors:Hypertension and Dyslipidemia.  Sonographer:     Leta Jungling RDCS Referring Phys:  [id number] [facility] A Izora Ribas Diagnosing Phys: Riley Lam MD  PROCEDURE: After discussion of the risks and benefits of a TEE, an informed consent was obtained from a family member. The patient was intubated. TEE procedure time was 6 minutes. The transesophogeal probe was passed without difficulty through the esophogus of the patient. Imaged were obtained with the patient in a supine position. Sedation performed by performing physician. Patients was under conscious sedation during this procedure. Image quality was good. The patient's vital signs; including heart rate, blood pressure, and oxygen saturation; remained stable throughout the procedure. The patient developed no complications during the procedure. A direct current cardioversion was performed.  IMPRESSIONS   1. Left ventricular ejection fraction, by estimation, is 55 to 60%. The left ventricle has normal function. 2. Right ventricular systolic function is normal. The right ventricular size is normal. 3. No left atrial/left atrial appendage thrombus was detected. 4. Moderate pericardial effusion. There is no evidence of cardiac tamponade. Large pleural effusion in the left lateral region. 5. The mitral valve is  grossly normal. At least moderate central mitral valve regurgitation. No evidence of mitral stenosis. R-R variability, limited images in the setting of emerent TEE/DCCV 6. The aortic valve is tricuspid. Aortic valve regurgitation is at least moderate. No holodiastolic flow verseral. R-R variability. No aortic stenosis is present. 7. There is mild (Grade II) plaque involving the descending aorta and ascending aorta.  FINDINGS Left Ventricle: Left ventricular ejection fraction, by estimation, is 55 to 60%. The left ventricle has normal function. The left ventricular internal cavity size was normal in size.  Right Ventricle: The right ventricular size is normal. No increase in right ventricular wall thickness. Right ventricular  systolic function is normal.  Left Atrium: Left atrial size was normal in size. No left atrial/left atrial appendage thrombus was detected.  Right Atrium: Right atrial size was normal in size.  Pericardium: A moderately sized pericardial effusion is present. There is no evidence of cardiac tamponade.  Mitral Valve: The mitral valve is grossly normal. Moderate mitral valve regurgitation. No evidence of mitral valve stenosis.  Tricuspid Valve: The tricuspid valve is normal in structure. Tricuspid valve regurgitation is trivial. No evidence of tricuspid stenosis.  Aortic Valve: The aortic valve is tricuspid. Aortic valve regurgitation is moderate. No aortic stenosis is present.  Pulmonic Valve: The pulmonic valve was not well visualized. Pulmonic valve regurgitation is not visualized.  Aorta: The aortic root, ascending aorta and aortic arch are all structurally normal, with no evidence of dilitation or obstruction. There is mild (Grade II) plaque involving the descending aorta and ascending aorta.  IAS/Shunts: The atrial septum is grossly normal.  Additional Comments: There is a large pleural effusion in the left lateral region. Spectral Doppler performed.  Rudean Haskell MD Electronically signed by Rudean Haskell MD Signature Date/Time: 03/09/2022/6:37:44 PM    Final             Patient Profile     76 y.o. female with Afib and embolic stroke  Assessment & Plan     Persistent Atrial Fibrillation Embolic stroke with hemorrhagic conversion; per chart review in the setting of DOAC non-adherence Anemia, worsening suspicion anemia of chronic disease HTN Qtc prolongation - with RVR - on heparin without new SAH (see 03/09/22 note for more details) - will continue IV amiodarone despite (unclear) allergy given limited options - stop diltiazem today (there is no epic order for me to stop, discussed with team and nursing) - reviewed with daughter [name]   Moderate pericardial effusion with out tamponade Moderate AI, Moderate MR - limited echo in ~ 2 weeks; TEE shows unchanged findings 03/09/22   HLD - continue statin  CRITICAL CARE Performed by: Aracelli Woloszyn A Kimball Manske  Total critical care time: 35 minutes. Critical care time was exclusive of separately billable procedures and treating other patients. Critical care was necessary to treat or prevent imminent or life-threatening deterioration. Critical care was time spent personally by me on the following activities: development of treatment plan with patient and/or surrogate as well as nursing, discussions with consultants, evaluation of patient's response to treatment, examination of patient, obtaining history from patient or surrogate, ordering and performing treatments and interventions, ordering and review of laboratory studies, ordering and review of radiographic studies, pulse oximetry and re-evaluation of patient's condition.    Signed, Rudean Haskell, [place]  03/10/2022 9:34 AM     For questions or updates, please contact [facility] Please consult [email or web address] for contact info under Cardiology/STEMI.      Rudean Haskell, MD FASE [address] [place], [place] [address] [phone]  8:01 AM

## 2022-03-10 NOTE — Progress Notes (Addendum)
STROKE TEAM PROGRESS NOTE   INTERVAL HISTORY Patient seen in room with no family at the bedside. Remains intubated, but more awake alert, eyes open and following commands. Still has afib, but heart rate is in the 70s today.   Vitals:   03/10/22 0820 03/10/22 0900 03/10/22 0951 03/10/22 1000  BP:  113/65  (!) 117/48  Pulse:  79 61 64  Resp:  (!) 34  (!) 21  Temp:      TempSrc:      SpO2: 99% 99% 98% 98%  Weight:      Height:       CBC:  Recent Labs  Lab 03/04/2022 1500 03/21/2022 1505 03/09/22 0605 03/10/22 0645  WBC 11.1*   < > 9.1 11.6*  NEUTROABS 8.1*  --   --   --   HGB 14.2   < > 9.7* 10.0*  HCT 42.5   < > 29.4* 29.8*  MCV 85.3   < > 90.2 86.9  PLT 104*   < > 95* 142*   < > = values in this interval not displayed.    Basic Metabolic Panel:  Recent Labs  Lab 03/09/22 0605 03/09/22 1648 03/10/22 0645  NA 141  --  139  K 4.1  --  3.9  CL 114*  --  110  CO2 19*  --  20*  GLUCOSE 110*  --  153*  BUN 11  --  13  CREATININE 0.58  --  0.58  CALCIUM 8.3*  --  8.1*  MG 1.9 1.9 1.8  PHOS 2.9 3.1 2.5    Lipid Panel:  Recent Labs  Lab 03/07/22 0547  CHOL 114  TRIG 125  HDL 32*  CHOLHDL 3.6  VLDL 25  LDLCALC 57    HgbA1c:  Recent Labs  Lab 02/23/2022 1500  HGBA1C 5.0    Urine Drug Screen: No results for input(s): "LABOPIA", "COCAINSCRNUR", "LABBENZ", "AMPHETMU", "THCU", "LABBARB" in the last 168 hours.  Alcohol Level  Recent Labs  Lab 03/03/2022 1500  ETH <10     IMAGING past 24 hours DG CHEST PORT 1 VIEW  Result Date: 03/09/2022 CLINICAL DATA:  Intubated EXAM: PORTABLE CHEST 1 VIEW COMPARISON:  03/04/2022 FINDINGS: Single frontal view of the chest demonstrates endotracheal tube overlying tracheal air column tip at thoracic inlet. Enteric catheter passes below diaphragm tip excluded by collimation. The cardiac silhouette is enlarged but stable. There is progressive left-sided airspace disease consistent with pneumonia. Small left effusion at the left  lung base. Right chest is clear. No pneumothorax. IMPRESSION: 1. Progressive left-sided bronchopneumonia with small left parapneumonic effusion. 2. Support devices as above. Electronically Signed   By: Sharlet Salina M.D.   On: 03/09/2022 21:22   ECHO TEE  Result Date: 03/09/2022    TRANSESOPHOGEAL ECHO REPORT   Patient Name:   TESNEEM DUFRANE [facility] Date of Exam: 03/09/2022 Medical Rec #:  [id number]       Height:       63.0 in Accession #:    [id number]      Weight:       135.4 lb Date of Birth:  May 12, 1945        BSA:          1.638 m Patient Age:    76 years        BP:           125/68 mmHg Patient Gender: F  HR:           83 bpm. Exam Location:  Inpatient Procedure: Transesophageal Echo, Cardiac Doppler and Color Doppler Indications:     Afib  History:         Patient has prior history of Echocardiogram examinations, most                  recent 03/07/2022. CAD; Risk Factors:Hypertension and                  Dyslipidemia.  Sonographer:     Darlina Sicilian RDCS Referring Phys:  [id number] [facility] Gasper Sells Diagnosing Phys: Rudean Haskell MD PROCEDURE: After discussion of the risks and benefits of a TEE, an informed consent was obtained from a family member. The patient was intubated. TEE procedure time was 6 minutes. The transesophogeal probe was passed without difficulty through the esophogus of the patient. Imaged were obtained with the patient in a supine position. Sedation performed by performing physician. Patients was under conscious sedation during this procedure. Image quality was good. The patient's vital signs; including heart rate, blood pressure, and oxygen saturation; remained stable throughout the procedure. The patient developed no complications during the procedure. A direct current cardioversion was performed.  IMPRESSIONS  1. Left ventricular ejection fraction, by estimation, is 55 to 60%. The left ventricle has normal function.  2. Right ventricular systolic function is normal.  The right ventricular size is normal.  3. No left atrial/left atrial appendage thrombus was detected.  4. Moderate pericardial effusion. There is no evidence of cardiac tamponade. Large pleural effusion in the left lateral region.  5. The mitral valve is grossly normal. At least moderate central mitral valve regurgitation. No evidence of mitral stenosis. R-R variability, limited images in the setting of emerent TEE/DCCV  6. The aortic valve is tricuspid. Aortic valve regurgitation is at least moderate. No holodiastolic flow verseral. R-R variability. No aortic stenosis is present.  7. There is mild (Grade II) plaque involving the descending aorta and ascending aorta. FINDINGS  Left Ventricle: Left ventricular ejection fraction, by estimation, is 55 to 60%. The left ventricle has normal function. The left ventricular internal cavity size was normal in size. Right Ventricle: The right ventricular size is normal. No increase in right ventricular wall thickness. Right ventricular systolic function is normal. Left Atrium: Left atrial size was normal in size. No left atrial/left atrial appendage thrombus was detected. Right Atrium: Right atrial size was normal in size. Pericardium: A moderately sized pericardial effusion is present. There is no evidence of cardiac tamponade. Mitral Valve: The mitral valve is grossly normal. Moderate mitral valve regurgitation. No evidence of mitral valve stenosis. Tricuspid Valve: The tricuspid valve is normal in structure. Tricuspid valve regurgitation is trivial. No evidence of tricuspid stenosis. Aortic Valve: The aortic valve is tricuspid. Aortic valve regurgitation is moderate. No aortic stenosis is present. Pulmonic Valve: The pulmonic valve was not well visualized. Pulmonic valve regurgitation is not visualized. Aorta: The aortic root, ascending aorta and aortic arch are all structurally normal, with no evidence of dilitation or obstruction. There is mild (Grade II) plaque  involving the descending aorta and ascending aorta. IAS/Shunts: The atrial septum is grossly normal. Additional Comments: There is a large pleural effusion in the left lateral region. Spectral Doppler performed. Rudean Haskell MD Electronically signed by Rudean Haskell MD Signature Date/Time: 03/09/2022/6:37:44 PM    Final    DG Abd Portable 1V  Result Date: 03/09/2022 CLINICAL DATA:  Feeding tube placement. EXAM:  PORTABLE ABDOMEN - 1 VIEW COMPARISON:  Radiographs earlier the same date and 03/11/2022. CT 10/27/2010. FINDINGS: 1635 hours. The tip of the feeding tube is unchanged from the earlier study, projecting over the left iliac crest and likely in the distal body of the stomach. The visualized bowel gas pattern is nonobstructive. Persistent left lower lobe airspace disease with probable left pleural effusion. IMPRESSION: Unchanged position of the feeding tube, tip likely in the distal body of the stomach. Electronically Signed   By: Richardean Sale M.D.   On: 03/09/2022 16:56    PHYSICAL EXAM  Temp:  [97.5 F (36.4 C)-100 F (37.8 C)] 99.1 F (37.3 C) (11/16 0759) Pulse Rate:  [59-145] 64 (11/16 1000) Resp:  [16-34] 21 (11/16 1000) BP: (64-131)/(44-81) 117/48 (11/16 1000) SpO2:  [92 %-100 %] 98 % (11/16 1000) FiO2 (%):  [40 %] 40 % (11/16 0820) Weight:  [61.5 kg] 61.5 kg (11/16 0500)  General - Well nourished, well developed, intubated on precedex.  Ophthalmologic - fundi not visualized due to noncooperation.  Cardiovascular - irregularly irregular heart rate and rhythm  Neuro - intubated on precedex, eyes open, following simple commands on the right but psychomotor slowing. Eyes in mid position, inconsistently blinks to threat on the right only, doll's eyes present, not tracking, PERRL. Corneal reflex present, gag and cough present. Facial symmetry not able to test due to ET tube.  Tongue protrusion not cooperative. RUE and RLE 3/5 against gravity, LUE flaccid. LLE slight  withdraw to pain. Sensation, coordination not cooperative and gait not tested.   ASSESSMENT/PLAN Ms. PETRONIA JOICE is a 76 y.o. female with history of DVT, HTN, HLD, Afibb on Eliquis who presents with sudden onset left-sided weakness, right gaze deviation. Mechanical thrombectomy done 11/12 with TICI2c revascularization. Remained intubated post procedure, MRI done and shows bilateral hemisphere strokes. According the the patients daughter she started feeling poorly on Saturday and likely stopped taking her medicine that day, including her xarelto.   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  CTA head & neck Distal right M1 MCA occlusion.  Post IR CT Hyperdense material in the right temporal and frontal lobe, which appears similar to the post thrombectomy CT and most likely represents contrast staining, although superimposed hemorrhage cannot be excluded. Redemonstrated hypodensity in the bilateral frontal lobes, right parietal lobe, and right occipital lobe, consistent with infarcts. Previously noted cerebellar hypodensity is less conspicuous on this exam. MRI  Scattered and confluent areas of restricted diffusion in the bilateral cerebral and cerebellar hemispheres, consistent with acute infarcts, likely embolic.  Small amount of subarachnoid hemorrhage in the right temporoparietal occipital region, bilateral occipital horns and along the tentorium.  MRA  R MCA patent now 2D Echo EF 60-65%  LDL 57 HgbA1c 5.0 VTE prophylaxis - heparin subq Eliquis (apixaban) daily prior to admission, now on heparin IV.  Therapy recommendations:  pending Disposition:  pending  Atrial fibrillation RVR Home meds:  Eliquis, sotalol Not compliant with eliquis, at least 2 days out of eliquis RVR overnight, HR up to 180s->150s - cardiology on board On amiodarone and cardizem drip S/p high risk TEE/DCCV 03/09/22 Now in and out of afib but rate controlled On heparin IV  Hypotension Hx of HTN Home  meds:  amlodipine, metoprolol succinate, benazepril Unstable- was requiring vasopressors On low dose pressor this am BP goal 100-160 Long-term BP goal normotensive  Respiratory failure Possible CAP Intubated On weaning, extubate as able  Vent management per CCM on precedex On rocephin and azithromycin  Hyperlipidemia Home meds:  Atorvastatin 10mg , resumed in hospital LDL 57, goal < 70 Continue statin on discharge  Other Stroke Risk Factors Advanced Age >/= 65  Hx of DVT  Other Active Problems Dysphagia, OG in place on TF   Hospital day # 4  Patient seen by NP and then by MD Cortney E Carron Curie , MSN, AGACNP-BC [facility] See Amion for schedule and pager information 03/10/2022 11:33 AM   ATTENDING NOTE: I reviewed above note and agree with the assessment and plan. Pt was seen and examined.   No family at the bedside. Pt still intubated, lethargic on precedex. Yesterday she had TEE and DCCV for afib RVR, this am she is in and out of afib but rate controlled. She is on heparin IV now per stroke protocol and on pressor, amiodarone and cardizem drip.  On exam, she is lethargic, eyes open but not tracking or gaze as request. Inconsistently blinking to visual threat on the right but not on the left. Non verbal but able to follow some simple commands on the right hand and right foot with delay. LUE flaccid and LLE DF with pain stimulation. Sensation, coordination and gait not tested.  Will continue heparin IV for now, appreciate cardiology and CCM assistance. Close HR and BP monitoring. Taper off pressor as able. Pt has been on weaning, extubate as able by CCM. PT/OT still  pending. If not extubatable, we may need palliative care on board for [place] discussion.   For detailed assessment and plan, please refer to above/below as I have made changes wherever appropriate.   Rosalin Hawking, MD PhD Stroke Neurology 03/10/2022 3:04 PM  This patient is critically ill due to large RMCA infarct, s/p IR, afib RVR, respiratory failure and at significant risk of neurological worsening, death form recurrent stroke, heart failure, hemorrhagic conversion, seizure. This patient's care requires constant monitoring of vital signs, hemodynamics, respiratory and cardiac monitoring, review of multiple databases, neurological assessment, discussion with family, other specialists and medical decision making of high complexity. I spent 40 minutes of neurocritical care time in the care of this patient. I discussed with Dr. Ardyth Harps CCM    To contact Stroke Continuity provider, please refer to [email or web address] After hours, contact General Neurology

## 2022-03-10 NOTE — Progress Notes (Signed)
PT Cancellation Note  Patient Details Name: Sherry Olson MRN: [id number] DOB: December 16, 1945   Cancelled Treatment:    Reason Eval/Treat Not Completed: Patient not medically ready.  Still intubated, not sure if well extubate today.  Hold per RN for now. 03/10/2022  Jacinto Halim., PT Acute Rehabilitation Services [phone]  (office)   Sherry Olson 03/10/2022, 2:56 PM

## 2022-03-10 NOTE — Progress Notes (Signed)
Sherry Olson, MRN:  [id number], DOB:  [birth year], LOS: 4 ADMISSION DATE:  03/24/2022, CONSULTATION DATE:  02/26/2022 REFERRING MD: Derry Lory - Neuro, CHIEF COMPLAINT:  CVA   History of Present Illness:  76 year old woman who presented to [facility] ED 11/12 via EMS as Code Stroke with reported L-sided weakness and facial droop. LKW 1345 11/12. Intermittent confusion x 2-3 days with some hallucinations. PMHx significant for HTN, HLD, CAD, history of DVT, AF (on Eliquis)  Found to have R MCA M1 segment occlusion, s/p mechanical thrombectomy with revascularization.  Achieved TICI 2C revascularization.  PCCM consulted.  Pertinent Medical History:   Past Medical History:  Diagnosis Date   Coronary artery disease    DVT (deep venous thrombosis) (HCC)    HLD (hyperlipidemia)    Hypertension    Significant Hospital Events: Including procedures, antibiotic start and stop dates in addition to other pertinent events   11/12 Code Stroke CT concerning for acute infarcts in bilateral frontal lobes, right parietal and occipital lobes and bilateral cerebellum. Given involvement of multiple vascular territories, consider embolic etiology. Also, recommend MRI to further characterize. 2. No acute hemorrhage. 11/13 tachycardia overnight. Briefly in AF requiring diltiazem, but converted back to NS. Dilt turned off. Phenylephrine needed for post IR BP goals. MRI 11/13 Scattered and confluent areas of restricted diffusion in the bilateral cerebral and cerebellar hemispheres, consistent with acute infarcts, likely embolic. Small SAH in the right temporoparietal, bilateral occipital horns, and along the tentorium.  Echo 11/13 LVEF 60-65%, no RWMAs, normal RV systolic function, moderate pericardial effusion with dilated IVC, mild diastolic RV collapse, +MR 11/15 Afib with RVR overnight to 140s. TEE completed, EF 55-60%, no LAA thrombus. DCCV x 2 with no conversion to NSR. Dilt gtt initiated. 11/16 Ongoing  Afib, improved rates to 70s-80s with spikes into 120s-130s less frequent. Dilt gtt discontinued. Ongoing amiodarone. Briefly weaned on vent but became tachypneic.  Interim History / Subjective:  No significant events overnight Ongoing Afib but rates improved today, 70s-80s with intermittent spikes to 120s Continues on amiodarone Dilt gtt discontinued TEE completed with normal EF, no LAA thrombus; DCCV x 2 attempted and no conversion to NSR Weaned vent this AM PSV 8/5, became tachypneic  Objective:  Blood pressure (!) 109/49, pulse 71, temperature 99 F (37.2 C), temperature source Axillary, resp. rate (!) 21, height 5\' 3"  (1.6 m), weight 61.5 kg, SpO2 98 %.    Vent Mode: PRVC FiO2 (%):  [40 %] 40 % Set Rate:  [16 bmp] 16 bmp Vt Set:  [410 mL-490 mL] 410 mL PEEP:  [5 cmH20] 5 cmH20 Pressure Support:  [8 cmH20] 8 cmH20 Plateau Pressure:  [15 cmH20-23 cmH20] 20 cmH20   Intake/Output Summary (Last 24 hours) at 03/10/2022 0739 Last data filed at 03/10/2022 0600 Gross per 24 hour  Intake 2283.04 ml  Output 437 ml  Net 1846.04 ml    Filed Weights   03/03/2022 1400 03/09/22 0500 03/10/22 0500  Weight: 51.8 kg 61.4 kg 61.5 kg   Physical Examination: General: Acute-on-chronically ill-appearing elderly woman in NAD. HEENT: [place]/AT, anicteric sclera, PERRL 66mm, dry mucous membranes. ETT/Cortrak in place. Neuro:  Awake, eyes open but not tracking examiner.  Responds to verbal stimuli. Following commands intermittently. Unilateral L-sided neglect noted. Generalized weakness.+Corneal, +Cough, and +Gag  CV: Irregularly irregular rhythm, rate 80s, no m/g/r. PULM: Breathing even and mildly tachypneic on vent (PSV 8/5). Lung fields diminished at bases L > R. GI: Soft, nontender, nondistended. Hypoactive bowel sounds. Extremities: Bilateral  symmetric 1+ LE edema noted. Skin: Warm/dry, no rashes. Ecchymosis to posterior R forearm.  Resolved Hospital Problem List:     Assessment & Plan:    Acute ischemic stroke with MCA occlusion on the right post revascularization SAH - Stroke team primary - Goal SBP 100-160s - Neo titrated to goal SBP, current rate 76mcg - ASA discontinued per Stroke team - Heparin gtt for [facility] (began 11/15) - PT/OT/SLP when able to participate in care  Atrial fibrillation with fast ventricular response in the 170s and 180s Chronic AF on Eliquis History of amiodarone allergy (palpitations) cardiology consulted. - Cardiology following, appreciate assistance - Continue amiodarone gtt - Discontinue diltiazem, as rates now better controlled - Neo as above to support BP goal - Heparin gtt as above, started 11/15 - S/p TEE/DCCV 11/15 with EF 55-60%, no LAA thrombus  Acute hypoxemic respiratory failure: stroke, CAP If unable to extubate in the next few days, would likely need to consider tracheostomy versus transition to comfort, pending progress and prognosis. - Continue full vent support (4-8cc/kg IBW) - Wean FiO2 for O2 sat > 90% - Daily WUA/SBT; good lung mechanics on wean but becoming tachypneic due to fatigue/generalized weakness, more consistently following commands - VAP bundle - Pulmonary hygiene - PAD protocol for sedation: Precedex for goal RASS 0 to -1  CAP vs. aspiration - Continue ceftriaxone/azithromycin - Trend WBC, fever curve - Low threshold to broaden antibiotics; doubt necessary as WBC downtrending  Hypertension CAD - Hold home Benazepril, Lasix, metop, sotalol - Hold ASA (per Stroke) - Heparin gtt as above  History of DVT (2012) followed by vascular in [place]. AC stopped in 2019. - AC reinitiated 11/15 - Continue heparin gtt  Nutrition  - Cortrak placed 11/15 - Continue TF via [place] (right click and "Reselect all SmartList Selections" daily)   Diet/type: NPO TF DVT prophylaxis: prophylactic heparin  GI prophylaxis: PPI Lines: N/A Foley:  N/A Code Status:  full code Last date of  multidisciplinary goals of care discussion [11/12]  Critical care time: 36 minutes   The patient is critically ill with multiple organ system failure and requires high complexity decision making for assessment and support, frequent evaluation and titration of therapies, advanced monitoring, review of radiographic studies and interpretation of complex data.   Critical Care Time devoted to patient care services, exclusive of separately billable procedures, described in this note is 36 minutes.  Lestine Mount, PA-C [facility] 03/10/22 7:39 AM  Please see [email or web address] for pager details.  From 7A-7P if no response, please call [phone] After hours, please call ELink [phone]

## 2022-03-10 NOTE — Progress Notes (Signed)
ANTICOAGULATION CONSULT NOTE - Initial Consult  Pharmacy Consult:  Heparin Indication: atrial fibrillation  Allergies  Allergen Reactions   Amiodarone Palpitations    "Heart trouble" per patient's daughter and husband.   Colesevelam Nausea And Vomiting   Welchol [Colesevelam Hcl] Diarrhea and Nausea And Vomiting    Patient Measurements: Height: 5\' 3"  (160 cm) Weight: 61.5 kg (135 lb 9.3 oz) IBW/kg (Calculated) : 52.4 Heparin Dosing Weight: 61 kg  Vital Signs: Temp: 99.1 F (37.3 C) (11/16 0759) Temp Source: Axillary (11/16 0759) BP: 117/48 (11/16 1000) Pulse Rate: 64 (11/16 1000)  Labs: Recent Labs    03/08/22 0453 03/08/22 0810 03/08/22 0810 03/09/22 0605 03/09/22 1740 03/09/22 2254 03/10/22 0645 03/10/22 1046  HGB  --  10.7*   < > 9.7*  --   --  10.0*  --   HCT  --  34.6*  --  29.4*  --   --  29.8*  --   PLT  --  105*  --  95*  --   --  142*  --   APTT  --   --   --   --   --  67*  --   --   LABPROT 17.7*  --   --   --  15.4*  --   --   --   INR 1.5*  --   --   --  1.2  --   --   --   HEPARINUNFRC  --   --   --   --   --  0.45  --  0.26*  CREATININE 0.66  --   --  0.58  --   --  0.58  --    < > = values in this interval not displayed.     Estimated Creatinine Clearance: 48.7 mL/min (by C-G formula based on SCr of 0.58 mg/dL).   Medical History: Past Medical History:  Diagnosis Date   Coronary artery disease    DVT (deep venous thrombosis) (HCC)    HLD (hyperlipidemia)    Hypertension     Assessment: 51 YOF with Afib on Eliquis PTA, last dose unknown.  Patient has an acute stroke with a small SAH s/p revascularization on 11/12.  She has been in Afib RVR and pharmacy consulted to dose IV heparin for planned cardioversion. Renal function stable. D/w neuro-okay to continue heparin drip.  HL 0.26 (subtherapeutic)  CBC: Hgb 10.0/ Plt1 42 (stable)  -no issues with infusion/line and no s/sx bleeding per RN.   Goal of Therapy:  Heparin level 0.3-0.5  units/ml Monitor platelets by anticoagulation protocol: Yes   Plan:  Heparin infusion at 800 units/hr - no boluses with SAH Check 8 hr heparin level @2000  Daily heparin level and CBC  13/12, PharmD Clinical Pharmacist 03/10/2022 12:10 PM

## 2022-03-11 ENCOUNTER — Inpatient Hospital Stay (HOSPITAL_COMMUNITY): Payer: Medicare Other

## 2022-03-11 ENCOUNTER — Inpatient Hospital Stay: Payer: Self-pay

## 2022-03-11 DIAGNOSIS — I4819 Other persistent atrial fibrillation: Secondary | ICD-10-CM | POA: Diagnosis not present

## 2022-03-11 DIAGNOSIS — J9601 Acute respiratory failure with hypoxia: Secondary | ICD-10-CM | POA: Diagnosis not present

## 2022-03-11 DIAGNOSIS — I4891 Unspecified atrial fibrillation: Secondary | ICD-10-CM | POA: Diagnosis not present

## 2022-03-11 DIAGNOSIS — J9602 Acute respiratory failure with hypercapnia: Secondary | ICD-10-CM | POA: Diagnosis not present

## 2022-03-11 DIAGNOSIS — I63511 Cerebral infarction due to unspecified occlusion or stenosis of right middle cerebral artery: Secondary | ICD-10-CM | POA: Diagnosis not present

## 2022-03-11 LAB — GLUCOSE, CAPILLARY
Glucose-Capillary: 151 mg/dL — ABNORMAL HIGH (ref 70–99)
Glucose-Capillary: 137 mg/dL — ABNORMAL HIGH (ref 70–99)
Glucose-Capillary: 126 mg/dL — ABNORMAL HIGH (ref 70–99)
Glucose-Capillary: 122 mg/dL — ABNORMAL HIGH (ref 70–99)
Glucose-Capillary: 153 mg/dL — ABNORMAL HIGH (ref 70–99)
Glucose-Capillary: 114 mg/dL — ABNORMAL HIGH (ref 70–99)

## 2022-03-11 LAB — MAGNESIUM: Magnesium: 1.7 mg/dL (ref 1.7–2.4)

## 2022-03-11 MED ORDER — SODIUM CHLORIDE 0.9% FLUSH
10.0000 mL | Freq: Two times a day (BID) | INTRAVENOUS | Status: DC
  Administered 2022-03-11: 30 mL
  Administered 2022-03-11: 10 mL
  Administered 2022-03-12: 30 mL
  Administered 2022-03-12 – 2022-03-13 (×3): 10 mL
  Administered 2022-03-14: 20 mL
  Administered 2022-03-14 – 2022-03-15 (×3): 10 mL

## 2022-03-11 MED ORDER — FENTANYL CITRATE PF 50 MCG/ML IJ SOSY
200.0000 ug | PREFILLED_SYRINGE | Freq: Once | INTRAMUSCULAR | Status: AC
  Administered 2022-03-11: 100 ug via INTRAVENOUS
  Filled 2022-03-11: qty 4

## 2022-03-11 MED ORDER — VITAL 1.5 CAL PO LIQD
1000.0000 mL | ORAL | Status: DC
  Administered 2022-03-11 – 2022-03-12 (×2): 1000 mL

## 2022-03-11 MED ORDER — MIDAZOLAM HCL 2 MG/2ML IJ SOLN: 5.0000 mg | Freq: Once | INTRAMUSCULAR | Status: DC

## 2022-03-11 MED ORDER — ALTEPLASE 2 MG IJ SOLR: 2.0000 mg | Freq: Once | INTRAMUSCULAR | Status: DC

## 2022-03-11 MED ORDER — PROPOFOL 1000 MG/100ML IV EMUL
0.0000 ug/kg/min | INTRAVENOUS | Status: DC
  Administered 2022-03-11: 20 ug/kg/min via INTRAVENOUS

## 2022-03-11 MED ORDER — HEPARIN (PORCINE) 25000 UT/250ML-% IV SOLN
1100.0000 [IU]/h | INTRAVENOUS | Status: DC
  Administered 2022-03-11: 900 [IU]/h via INTRAVENOUS
  Administered 2022-03-12: 1100 [IU]/h via INTRAVENOUS
  Filled 2022-03-11 (×2): qty 250

## 2022-03-11 MED ORDER — SODIUM CHLORIDE 0.9% FLUSH: 10.0000 mL | INTRAVENOUS | Status: DC | PRN

## 2022-03-11 MED ORDER — ROCURONIUM BROMIDE 10 MG/ML (PF) SYRINGE
PREFILLED_SYRINGE | INTRAVENOUS | Status: AC
  Administered 2022-03-11: 60 mg via INTRAVENOUS
  Filled 2022-03-11: qty 10

## 2022-03-11 MED ORDER — ETOMIDATE 2 MG/ML IV SOLN
20.0000 mg | Freq: Once | INTRAVENOUS | Status: AC
  Administered 2022-03-11: 20 mg via INTRAVENOUS
  Filled 2022-03-11: qty 10

## 2022-03-11 MED ORDER — MIDAZOLAM HCL 2 MG/2ML IJ SOLN
6.0000 mg | Freq: Once | INTRAMUSCULAR | Status: AC
  Administered 2022-03-11: 3 mg via INTRAVENOUS
  Filled 2022-03-11: qty 6

## 2022-03-11 MED ORDER — POLYETHYLENE GLYCOL 3350 17 G PO PACK: 17.0000 g | PACK | Freq: Every day | ORAL | Status: DC | PRN

## 2022-03-11 MED ORDER — AMIODARONE IV BOLUS ONLY 150 MG/100ML
150.0000 mg | Freq: Four times a day (QID) | INTRAVENOUS | Status: DC | PRN
  Administered 2022-03-12 (×2): 150 mg via INTRAVENOUS
  Filled 2022-03-11: qty 100

## 2022-03-11 MED ORDER — ROCURONIUM BROMIDE 50 MG/5ML IV SOLN
100.0000 mg | Freq: Once | INTRAVENOUS | Status: AC
  Filled 2022-03-11: qty 10

## 2022-03-11 MED ORDER — LACTATED RINGERS IV SOLN: INTRAVENOUS | Status: DC

## 2022-03-11 NOTE — Progress Notes (Addendum)
ANTICOAGULATION CONSULT NOTE - Initial Consult  Pharmacy Consult:  Heparin Indication: atrial fibrillation  Allergies  Allergen Reactions   Amiodarone Palpitations    "Heart trouble" per patient's daughter and husband.   Colesevelam Nausea And Vomiting   Welchol [Colesevelam Hcl] Diarrhea and Nausea And Vomiting    Patient Measurements: Height: 5\' 3"  (160 cm) Weight: 61.3 kg (135 lb 2.3 oz) IBW/kg (Calculated) : 52.4 Heparin Dosing Weight: 61 kg  Vital Signs: Temp: 98.1 F (36.7 C) (11/17 0400) Temp Source: Oral (11/17 0400) BP: 131/68 (11/17 0900) Pulse Rate: 127 (11/17 0900)  Labs: Recent Labs    03/09/22 0605 03/09/22 1740 03/09/22 2254 03/10/22 0645 03/10/22 1046 03/10/22 1945  HGB 9.7*  --   --  10.0*  --   --   HCT 29.4*  --   --  29.8*  --   --   PLT 95*  --   --  142*  --   --   APTT  --   --  67*  --   --   --   LABPROT  --  15.4*  --   --   --   --   INR  --  1.2  --   --   --   --   HEPARINUNFRC  --   --  0.45  --  0.26* 0.29*  CREATININE 0.58  --   --  0.58  --   --      Estimated Creatinine Clearance: 48.7 mL/min (by C-G formula based on SCr of 0.58 mg/dL).   Medical History: Past Medical History:  Diagnosis Date   Coronary artery disease    DVT (deep venous thrombosis) (HCC)    HLD (hyperlipidemia)    Hypertension     Assessment: 41 YOF with Afib on Eliquis PTA, last dose unknown.  Patient has an acute stroke with a small SAH s/p revascularization on 11/12.  She has been in Afib RVR and pharmacy consulted to dose IV heparin for planned cardioversion. Renal function stable. D/w neuro-okay to continue heparin drip.  CBC: Hgb 10.0/ Plt1 42 (stable - 11/16) -no issues with infusion/line and no s/sx bleeding per RN.   Goal of Therapy:  Heparin level 0.3-0.5 units/ml Monitor platelets by anticoagulation protocol: Yes   Plan:  Heparin on hold currently per CCM/Neuro pending family discussions 12/16  Neuro will re-consult pharmacy  when ready to restart heparin gtt pending procedural plans  LE:[email or web address], PharmD Clinical Pharmacist 03/11/2022 9:19 AM

## 2022-03-11 NOTE — Progress Notes (Signed)
Stopped Heparin gtt per MD request and TF for potential bedside procedure later today after family discussions. Ananda Caya, [place] Scrape, RN

## 2022-03-11 NOTE — Progress Notes (Signed)
PT Cancellation Note  Patient Details Name: Sherry Olson MRN: [id number] DOB: 29-Jan-1946   Cancelled Treatment:    Reason Eval/Treat Not Completed: Patient not medically ready.  Remians intubated; currently having PICC placed. Will follow up next week. 03/11/2022  Jacinto Halim., PT Acute Rehabilitation Services [phone]  (office)   [name] 03/11/2022, 1:54 PM

## 2022-03-11 NOTE — Progress Notes (Signed)
Attending:    Subjective: Awake on vent Tachycardic RR 20-25 on PSV 10/5  Objective: Vitals:   03/11/22 0800 03/11/22 0831 03/11/22 0832 03/11/22 0900  BP: 128/69 (!) 134/55  131/68  Pulse: (!) 104 (!) 133  (!) 127  Resp: (!) 23 (!) 22  (!) 23  Temp:      TempSrc:      SpO2: 100% 99% 99% 100%  Weight:      Height:       Vent Mode: PSV;CPAP FiO2 (%):  [40 %] 40 % Set Rate:  [16 bmp] 16 bmp Vt Set:  [410 mL] 410 mL PEEP:  [5 cmH20] 5 cmH20 Pressure Support:  [10 cmH20] 10 cmH20 Plateau Pressure:  [18 cmH20-20 cmH20] 20 cmH20  Intake/Output Summary (Last 24 hours) at 03/11/2022 0957 Last data filed at 03/11/2022 0908 Gross per 24 hour  Intake 2260.13 ml  Output 1790 ml  Net 470.13 ml    General:  In bed on vent HENT: NCAT ETT in place PULM: CTA B, vent supported breathing CV: irreg irreg, tachy, no mgr GI: BS+, soft, nontender MSK: normal bulk and tone Neuro: awake, not moving left side    CBC    Component Value Date/Time   WBC 11.6 (H) 03/10/2022 0645   RBC 3.43 (L) 03/10/2022 0645   HGB 10.0 (L) 03/10/2022 0645   HGB 12.3 12/30/2011 0952   HCT 29.8 (L) 03/10/2022 0645   HCT 37.5 12/30/2011 0952   PLT 142 (L) 03/10/2022 0645   PLT 168 12/30/2011 0952   MCV 86.9 03/10/2022 0645   MCV 88 12/30/2011 0952   MCH 29.2 03/10/2022 0645   MCHC 33.6 03/10/2022 0645   RDW 13.8 03/10/2022 0645   RDW 13.4 12/30/2011 0952   LYMPHSABS 1.7 03/05/2022 1500   LYMPHSABS 1.2 12/30/2011 0952   MONOABS 1.0 03/09/2022 1500   MONOABS 0.4 12/30/2011 0952   EOSABS 0.1 03/18/2022 1500   EOSABS 0.1 12/30/2011 0952   BASOSABS 0.1 03/12/2022 1500   BASOSABS 0.1 12/30/2011 0952    BMET    Component Value Date/Time   NA 139 03/10/2022 0645   K 3.9 03/10/2022 0645   CL 110 03/10/2022 0645   CO2 20 (L) 03/10/2022 0645   GLUCOSE 153 (H) 03/10/2022 0645   BUN 13 03/10/2022 0645   CREATININE 0.58 03/10/2022 0645   CALCIUM 8.1 (L) 03/10/2022 0645   GFRNONAA >60 03/10/2022  0645      Impression/Plan: Bilateral embolic strokes> heparin, glucose goal 140-180, SBP < 160/90, secondary stroke prevention per stroke service Atrial fibrillation with RVR> amiodarone, tele, monitor HR Acute respiratory failure with hypoxemia> no significant improvement, not passing SBT due to elevated RR, poorly controlled atrial fibrillation and weakness; recommend tracheostomy.  If the family agrees can perform that later today Need for sedation for mechanical ventilation> PAD Protocol, precedex  Place PICC today  Rest per NP note  My cc time 24 minutes  Heber Sibley, MD [place] [facility] Pager: [phone] Cell: [phone] After 7pm: [phone]

## 2022-03-11 NOTE — Progress Notes (Signed)
NAMEVINEY Olson, MRN:  [id number], DOB:  May 20, 1945, LOS: 5 ADMISSION DATE:  03/18/2022, CONSULTATION DATE:  03/03/2022 REFERRING MD: Derry Lory - Neuro, CHIEF COMPLAINT:  CVA   History of Present Illness:  76 year old woman who presented to [facility] ED 11/12 via EMS as Code Stroke with reported L-sided weakness and facial droop. LKW 1345 11/12. Intermittent confusion x 2-3 days with some hallucinations. PMHx significant for HTN, HLD, CAD, history of DVT, AF (on Eliquis)  Found to have R MCA M1 segment occlusion, s/p mechanical thrombectomy with revascularization.  Achieved TICI 2C revascularization.  PCCM consulted.  Pertinent Medical History:   Past Medical History:  Diagnosis Date   Coronary artery disease    DVT (deep venous thrombosis) (HCC)    HLD (hyperlipidemia)    Hypertension    Significant Hospital Events: Including procedures, antibiotic start and stop dates in addition to other pertinent events   11/12 Code Stroke CT concerning for acute infarcts in bilateral frontal lobes, right parietal and occipital lobes and bilateral cerebellum. Given involvement of multiple vascular territories, consider embolic etiology. Also, recommend MRI to further characterize. 2. No acute hemorrhage. 11/13 tachycardia overnight. Briefly in AF requiring diltiazem, but converted back to NS. Dilt turned off. Phenylephrine needed for post IR BP goals. MRI 11/13 Scattered and confluent areas of restricted diffusion in the bilateral cerebral and cerebellar hemispheres, consistent with acute infarcts, likely embolic. Small SAH in the right temporoparietal, bilateral occipital horns, and along the tentorium.  Echo 11/13 LVEF 60-65%, no RWMAs, normal RV systolic function, moderate pericardial effusion with dilated IVC, mild diastolic RV collapse, +MR 11/15 Afib with RVR overnight to 140s. TEE completed, EF 55-60%, no LAA thrombus. DCCV x 2 with no conversion to NSR. Dilt gtt initiated. 11/16 Ongoing  Afib, improved rates to 70s-80s with spikes into 120s-130s less frequent. Dilt gtt discontinued. Ongoing amiodarone. Briefly weaned on vent x 2 but became tachypneic. 11/17 Intermittent NSR/Afib with rates 80s-130s. Amiodarone ongoing. Weaned on vent 10/5, but concern patient is still too weak to safely extubate. Considering tracheostomy. PICC to be placed today.  Interim History / Subjective:  No significant events overnight Patient continues to flip in/out of Afib, rates variable 80s to 130s Continues on amiodarone Weaning on vent this AM, PSV 10/5 CXR with worsening LLL opacity Concern patient's generalized weakness/deconditioning will be a barrier to safe extubation/further progress Considering tracheostomy, primary team (Stroke) to d/w family re: prognosis and GOC  Objective:  Blood pressure (!) 105/45, pulse 73, temperature 98.1 F (36.7 C), temperature source Oral, resp. rate 20, height 5\' 3"  (1.6 m), weight 61.3 kg, SpO2 100 %.    Vent Mode: PRVC FiO2 (%):  [40 %] 40 % Set Rate:  [16 bmp] 16 bmp Vt Set:  [410 mL] 410 mL PEEP:  [5 cmH20] 5 cmH20 Pressure Support:  [8 cmH20-10 cmH20] 10 cmH20 Plateau Pressure:  [18 cmH20-20 cmH20] 20 cmH20   Intake/Output Summary (Last 24 hours) at 03/11/2022 0740 Last data filed at 03/11/2022 0700 Gross per 24 hour  Intake 2241.86 ml  Output 1790 ml  Net 451.86 ml    Filed Weights   03/09/22 0500 03/10/22 0500 03/11/22 0444  Weight: 61.4 kg 61.5 kg 61.3 kg   Physical Examination: General: Chronically ill-appearing elderly woman in NAD. Frail. HEENT: [place]/AT, anicteric sclera, PERRL 55mm, dry mucous membranes. ETT/Cortrak in place. Neuro:  Awake, appears to be oriented, nodding appropriately to questions.  Responds to verbal stimuli. Following commands consistently on R. Unilateral L-sided  neglect noted. Generalized weakness. +Corneal, +Cough, and +Gag  CV: Irregularly irregular rhythm, rates 90s-130s, no m/g/r. PULM: Breathing tachypneic  to high 20s and mildly labored on vent (PSV 10/5, FiO2 40%). Lung fields diminished on L, clear on R. GI: Soft, nontender, nondistended. Normoactive bowel sounds. Extremities: Bilateral symmetric 1+ LE edema noted. Skin: Warm/dry, no rashes. Scattered ecchymosis to bilateral forearms.  Resolved Hospital Problem List:     Assessment & Plan:   Acute ischemic stroke with MCA occlusion on the right post revascularization SAH - Stroke team primary - Goal SBP 100-160 - Neo titrated to goal SBP, weaning as able - ASA discontinued per Stroke team - Heparin gtt started 11/15, HELD 11/17AM in the setting of possible trach - Inability to safely extubate remains barrier to progress - PT/OT/SLP when able to participate in care  Atrial fibrillation with fast ventricular response in the 170s and 180s Chronic AF on Eliquis History of amiodarone allergy (palpitations) cardiology consulted. S/p TEE/DCCV 11/15 with EF 55-60%, no LAA thrombus. DCCV x 2 11/15 with transient conversion to NSR. - Cardiology following, appreciate assistance - Amiodarone gtt - Dilt discontinued 11/16, rates improved - Neo as above to support BP goal - Heparin gtt held 11/17AM for ?trach, resume as appropriate - Cardiac monitoring  Acute hypoxemic respiratory failure: stroke, CAP If unable to extubate in the next few days, would likely need to consider tracheostomy versus transition to comfort, pending progress and prognosis. - Continue full vent support (4-8cc/kg IBW) - Wean FiO2 for O2 sat > 90% - Daily WUA/SBT; ongoing good lung mechanics on wean but fails fairly quickly due to fatigue and general weakness/deconditioning; at this point patient has been intubated nearly 1 week and if patient/family wishes to continue to be aggressive with rehabilitation tracheostomy will likely be necessary - Possible trach today, 11/17 pending family discussion - VAP bundle - Pulmonary hygiene - PAD protocol for sedation: Precedex for  goal RASS 0 to -1  CAP vs. Aspiration - Continue ceftriaxone/azithromycin - Trend WBC, fever curve - Low threshold to broaden antibiotics - Intermittent CXR, monitor LLL PNA  Hypertension CAD - Hold home Benazepril, Lasix metoprolol, sotalol - Hold ASA (per Stroke)  History of DVT (2012) followed by vascular in [place]. AC stopped in 2019. - AC reinitiated 11/15 - Heparin gtt (held 11/17AM for ?procedure), resume as appropriate  Nutrition  - Cortrak placed 11/15 - TF via [place] (right click and "Reselect all SmartList Selections" daily)   Diet/type: NPO TF DVT prophylaxis: prophylactic heparin  GI prophylaxis: PPI Lines: N/A Foley:  N/A Code Status:  full code Last date of multidisciplinary goals of care discussion [11/17, per Stroke team]  Critical care time: 37 minutes   The patient is critically ill with multiple organ system failure and requires high complexity decision making for assessment and support, frequent evaluation and titration of therapies, advanced monitoring, review of radiographic studies and interpretation of complex data.   Critical Care Time devoted to patient care services, exclusive of separately billable procedures, described in this note is 37 minutes.  Lestine Mount, PA-C [place] 03/11/22 7:40 AM  Please see [email or web address] for pager details.  From 7A-7P if no response, please call [phone] After hours, please call ELink [phone]

## 2022-03-11 NOTE — Procedures (Signed)
Diagnostic Bronchoscopy  Sherry Olson  [id number]  01-May-1945  Date:03/11/22  Time:3:19 PM   Provider Performing:Madilynne Mullan Judie Petit Pecola Leisure   Procedure: Diagnostic Bronchoscopy [phone])  Indication(s): Assist with direct visualization of tracheostomy placement  Consent: Risks of the procedure as well as the alternatives and risks of each were explained to the patient and/or caregiver.  Consent for the procedure was obtained.  Anesthesia: See separate tracheostomy note  Time Out: Verified patient identification, verified procedure, site/side was marked, verified correct patient position, special equipment/implants available, medications/allergies/relevant history reviewed, required imaging and test results available.  Sterile Technique: Usual hand hygiene, masks, gowns, and gloves were used  Procedure Description: Bronchoscope advanced through endotracheal tube and into airway.  After suctioning out tracheal secretions, bronchoscope used to provide direct visualization of tracheostomy placement.  Complications/Tolerance: None; patient tolerated the procedure well.  EBL: Minimal  Specimen(s): None  The entire procedure was supervised/proctored by Dr. Cheri Fowler.  Tim Lair, PA-C [place] 03/11/22 3:20 PM  Please see [email or web address] for pager details.

## 2022-03-11 NOTE — Progress Notes (Signed)
OT Cancellation Note  Patient Details Name: Sherry Olson MRN: [id number] DOB: Nov 07, 1945   Cancelled Treatment:    Reason Eval/Treat Not Completed: Patient not medically ready (REmians intubated; currently having PICC placed. Will follow up next week.)  [name],HILLARY 03/11/2022, 1:31 PM Luisa Dago, OT/L   Acute OT Clinical Specialist Acute Rehabilitation Services Pager [phone] Office [phone]

## 2022-03-11 NOTE — Progress Notes (Signed)
At bedside for PICC placement. Upon completion, RAC region noted to have a previous PIV recently removed. Site hard and remains open from insertion from catheter. No drainage noted at this time. Bilateral arms with edema from infiltrations and multiple PIV sticks. Secure port to site to seal site and prevent further risk for infection. RN made aware.

## 2022-03-11 NOTE — Progress Notes (Signed)
Peripherally Inserted Central Catheter Placement  The IV Nurse has discussed with the patient and/or persons authorized to consent for the patient, the purpose of this procedure and the potential benefits and risks involved with this procedure.  The benefits include less needle sticks, lab draws from the catheter, and the patient may be discharged home with the catheter. Risks include, but not limited to, infection, bleeding, blood clot (thrombus formation), and puncture of an artery; nerve damage and irregular heartbeat and possibility to perform a PICC exchange if needed/ordered by physician.  Alternatives to this procedure were also discussed.  Bard Power PICC patient education guide, fact sheet on infection prevention and patient information card has been provided to patient /or left at bedside.    PICC Placement Documentation  PICC Triple Lumen 03/11/22 Right Brachial 34 cm 0 cm (Active)  Site Assessment Clean, Dry, Intact 03/11/22 1315  Lumen #1 Status Flushed;Blood return noted;Saline locked 03/11/22 1315  Lumen #2 Status Flushed;Blood return noted;Saline locked 03/11/22 1315  Lumen #3 Status Flushed;Blood return noted;Saline locked 03/11/22 1315  Dressing Type Transparent;Securing device 03/11/22 1315  Dressing Status Antimicrobial disc in place 03/11/22 1315  Safety Lock Not Applicable 03/11/22 1315  Dressing Change Due 03/18/22 03/11/22 1315       Romie Jumper 03/11/2022, 1:18 PM

## 2022-03-11 NOTE — Progress Notes (Signed)
ANTICOAGULATION CONSULT NOTE   Pharmacy Consult:  Heparin Indication: atrial fibrillation  Allergies  Allergen Reactions   Amiodarone Palpitations    "Heart trouble" per patient's daughter and husband.   Colesevelam Nausea And Vomiting   Welchol [Colesevelam Hcl] Diarrhea and Nausea And Vomiting    Patient Measurements: Height: 5\' 3"  (160 cm) Weight: 61.3 kg (135 lb 2.3 oz) IBW/kg (Calculated) : 52.4 Heparin Dosing Weight: 61 kg  Vital Signs: Temp: 98.1 F (36.7 C) (11/17 1600) Temp Source: Axillary (11/17 1600) BP: 122/43 (11/17 1700) Pulse Rate: 69 (11/17 1700)  Labs: Recent Labs    03/09/22 0605 03/09/22 1740 03/09/22 2254 03/10/22 0645 03/10/22 1046 03/10/22 1945  HGB 9.7*  --   --  10.0*  --   --   HCT 29.4*  --   --  29.8*  --   --   PLT 95*  --   --  142*  --   --   APTT  --   --  67*  --   --   --   LABPROT  --  15.4*  --   --   --   --   INR  --  1.2  --   --   --   --   HEPARINUNFRC  --   --  0.45  --  0.26* 0.29*  CREATININE 0.58  --   --  0.58  --   --      Estimated Creatinine Clearance: 48.7 mL/min (by C-G formula based on SCr of 0.58 mg/dL).   Medical History: Past Medical History:  Diagnosis Date   Coronary artery disease    DVT (deep venous thrombosis) (HCC)    HLD (hyperlipidemia)    Hypertension     Assessment: 32 YOF with Afib on Eliquis PTA, last dose unknown.  Patient has an acute stroke with a small SAH s/p revascularization on 11/12.  She has been in Afib RVR and pharmacy consulted to dose IV heparin for planned cardioversion. Renal function stable. D/w neuro-okay to continue heparin drip.  CBC: Hgb 10.0/ Plt1 42 (stable - 11/16) -no issues with infusion/line and no s/sx bleeding per RN.   Heparin was turned off this afternoon for trach placement.  Pharmacy now asked by CCM to resume.    Goal of Therapy:  Heparin level 0.3-0.5 units/ml Monitor platelets by anticoagulation protocol: Yes   Plan:  Restart heparin at 900  units/hr. Check heparin level in 8 hrs. Daily heparin level and CBC.  12/16, Reece Leader, BCCP Clinical Pharmacist  03/11/2022 5:19 PM   [facility] pharmacy phone numbers are listed on [email or web address]

## 2022-03-11 NOTE — Progress Notes (Signed)
   03/11/22 1555  Urine Characteristics  Urinary Interventions Bladder scan  Bladder Scan Volume (mL) 102 mL   CCM PA and MD notified of low urine output on bladder scan and no void post Foley. Order given for LR @ 75/hr.

## 2022-03-11 NOTE — Procedures (Signed)
Percutaneous Tracheostomy Procedure Note   Sherry Olson  [id number]  22-Nov-1945  Date:03/11/22  Time:3:32 PM   Provider Performing:Brent Darianne Muralles  Procedure: Percutaneous Tracheostomy with Bronchoscopic Guidance (40086)  Indication(s) Chronic respiratory failure with hypoxemia  Consent Risks of the procedure as well as the alternatives and risks of each were explained to the patient and/or caregiver.  Consent for the procedure was obtained.  Anesthesia Etomidate, Versed, Fentanyl, Vecuronium   Time Out Verified patient identification, verified procedure, site/side was marked, verified correct patient position, special equipment/implants available, medications/allergies/relevant history reviewed, required imaging and test results available.   Sterile Technique Maximal sterile technique including sterile barrier drape, hand hygiene, sterile gown, sterile gloves, mask, hair covering.    Procedure Description Appropriate anatomy identified by palpation.  Patient's neck prepped and draped in sterile fashion.  1% lidocaine with epinephrine was used to anesthetize skin overlying neck.  1.5cm incision made and blunt dissection performed until tracheal rings could be easily palpated.   Then a size 6  Shiley tracheostomy was placed under bronchoscopic visualization using usual Seldinger technique and serial dilation.   Bronchoscope confirmed placement above the carina.  Tracheostomy was sutured in place with adhesive pad to protect skin under pressure.    Patient connected to ventilator.   Complications/Tolerance None; patient tolerated the procedure well. Chest X-ray is ordered to confirm no post-procedural complication.   EBL Minimal   Specimen(s) None   [name], MD [place] [facility] Pager: [phone] Cell: [phone] After 7:00 pm call Elink  [phone]

## 2022-03-11 NOTE — Progress Notes (Addendum)
Progress Note  Patient Name: Sherry Olson Date of Encounter: 03/11/2022  Primary Cardiologist: None   Subjective   Rates have improved with intermittent PAF Planned for potential trach She is very response to sedation- she will convert when she is sedated but this causes her weaning to be worsened; she had not responded to dig.  She is still on pressors.  Inpatient Medications    Scheduled Meds:  atorvastatin  10 mg Per Tube Daily   Chlorhexidine Gluconate Cloth  6 each Topical Daily   docusate  100 mg Per Tube BID   mouth rinse  15 mL Mouth Rinse Q2H   pantoprazole (PROTONIX) IV  40 mg Intravenous QHS   polyethylene glycol  17 g Per Tube Daily   sodium chloride flush  3 mL Intravenous Once   Continuous Infusions:  sodium chloride 500 mL (03/10/22 0610)   amiodarone 30 mg/hr (03/11/22 0700)   azithromycin Stopped (03/10/22 1426)   cefTRIAXone (ROCEPHIN)  IV Stopped (03/10/22 1243)   dexmedetomidine (PRECEDEX) IV infusion 1.2 mcg/kg/hr (03/11/22 0700)   feeding supplement (PEPTAMEN 1.5 CAL) 30 mL/hr at 03/11/22 0700   heparin 900 Units/hr (03/11/22 0700)   phenylephrine (NEO-SYNEPHRINE) Adult infusion 16 mcg/min (03/11/22 0700)   PRN Meds: acetaminophen **OR** acetaminophen (TYLENOL) oral liquid 160 mg/5 mL **OR** acetaminophen, fentaNYL (SUBLIMAZE) injection, LORazepam, LORazepam, mouth rinse, senna-docusate   Vital Signs    Vitals:   03/11/22 0500 03/11/22 0548 03/11/22 0600 03/11/22 0700  BP: (!) 113/51  (!) 106/47 (!) 105/45  Pulse: 68 85 (!) 113 73  Resp: 18 (!) 23 (!) 24 20  Temp:      TempSrc:      SpO2: 99% 100% 100% 100%  Weight:      Height:        Intake/Output Summary (Last 24 hours) at 03/11/2022 0747 Last data filed at 03/11/2022 0700 Gross per 24 hour  Intake 2241.86 ml  Output 1790 ml  Net 451.86 ml   Filed Weights   03/09/22 0500 03/10/22 0500 03/11/22 0444  Weight: 61.4 kg 61.5 kg 61.3 kg    Telemetry    Sinus rhythm with  spikes of A fib - Personally Reviewed  Physical Exam   Gen: intubated and sedated  Cardiac: No Rubs or Gallops, systolic and diastolic murmur, RRR  Respiratory: Mechanical breath sounds GI: Soft, nontender, non-distended  Integument: Skin feels warm  Labs    Chemistry Recent Labs  Lab 02/27/2022 1500 02/23/2022 1505 03/07/22 0547 03/07/22 2344 03/08/22 0453 03/09/22 0605 03/10/22 0645  NA 140   < > 141   < > 143 141 139  K 4.0   < > 4.3   < > 4.4 4.1 3.9  CL 101   < > 110   < > 115* 114* 110  CO2 25   < > 20*   < > 20* 19* 20*  GLUCOSE 108*   < > 107*   < > 97 110* 153*  BUN 18   < > 14   < > 10 11 13   CREATININE 0.87   < > 0.72   < > 0.66 0.58 0.58  CALCIUM 9.4   < > 7.8*   < > 8.1* 8.3* 8.1*  PROT 6.4*  --  4.4*  --   --  4.9*  --   ALBUMIN 3.5  --  2.5*  --   --  2.3*  --   AST 60*  --  45*  --   --  29  --   ALT 23  --  19  --   --  16  --   ALKPHOS 61  --  44  --   --  49  --   BILITOT 1.1  --  0.5  --   --  0.4  --   GFRNONAA >60   < > >60   < > >60 >60 >60  ANIONGAP 14   < > 11   < > 8 8 9    < > = values in this interval not displayed.     Hematology Recent Labs  Lab 03/08/22 0810 03/09/22 0605 03/10/22 0645  WBC 13.5* 9.1 11.6*  RBC 3.67* 3.26* 3.43*  HGB 10.7* 9.7* 10.0*  HCT 34.6* 29.4* 29.8*  MCV 94.3 90.2 86.9  MCH 29.2 29.8 29.2  MCHC 30.9 33.0 33.6  RDW 14.1 13.5 13.8  PLT 105* 95* 142*    Cardiac EnzymesNo results for input(s): "TROPONINI" in the last 168 hours. No results for input(s): "TROPIPOC" in the last 168 hours.   BNPNo results for input(s): "BNP", "PROBNP" in the last 168 hours.   DDimer No results for input(s): "DDIMER" in the last 168 hours.   Radiology    DG CHEST PORT 1 VIEW  Result Date: 03/09/2022 CLINICAL DATA:  Intubated EXAM: PORTABLE CHEST 1 VIEW COMPARISON:  03/02/2022 FINDINGS: Single frontal view of the chest demonstrates endotracheal tube overlying tracheal air column tip at thoracic inlet. Enteric catheter passes  below diaphragm tip excluded by collimation. The cardiac silhouette is enlarged but stable. There is progressive left-sided airspace disease consistent with pneumonia. Small left effusion at the left lung base. Right chest is clear. No pneumothorax. IMPRESSION: 1. Progressive left-sided bronchopneumonia with small left parapneumonic effusion. 2. Support devices as above. Electronically Signed   By: Randa Ngo M.D.   On: 03/09/2022 21:22   ECHO TEE  Result Date: 03/09/2022    TRANSESOPHOGEAL ECHO REPORT   Patient Name:   TAMI ALBERTS [facility] Date of Exam: 03/09/2022 Medical Rec #:  [id number]       Height:       63.0 in Accession #:    [id number]      Weight:       135.4 lb Date of Birth:  1945-07-22        BSA:          1.638 m Patient Age:    76 years        BP:           125/68 mmHg Patient Gender: F               HR:           83 bpm. Exam Location:  Inpatient Procedure: Transesophageal Echo, Cardiac Doppler and Color Doppler Indications:     Afib  History:         Patient has prior history of Echocardiogram examinations, most                  recent 03/07/2022. CAD; Risk Factors:Hypertension and                  Dyslipidemia.  Sonographer:     Darlina Sicilian RDCS Referring Phys:  [id number] [facility] A Gasper Sells Diagnosing Phys: Rudean Haskell MD PROCEDURE: After discussion of the risks and benefits of a TEE, an informed consent was obtained from a family member. The patient was intubated. TEE procedure time was 6 minutes. The transesophogeal probe was  passed without difficulty through the esophogus of the patient. Imaged were obtained with the patient in a supine position. Sedation performed by performing physician. Patients was under conscious sedation during this procedure. Image quality was good. The patient's vital signs; including heart rate, blood pressure, and oxygen saturation; remained stable throughout the procedure. The patient developed no complications during the procedure. A direct current  cardioversion was performed.  IMPRESSIONS  1. Left ventricular ejection fraction, by estimation, is 55 to 60%. The left ventricle has normal function.  2. Right ventricular systolic function is normal. The right ventricular size is normal.  3. No left atrial/left atrial appendage thrombus was detected.  4. Moderate pericardial effusion. There is no evidence of cardiac tamponade. Large pleural effusion in the left lateral region.  5. The mitral valve is grossly normal. At least moderate central mitral valve regurgitation. No evidence of mitral stenosis. R-R variability, limited images in the setting of emerent TEE/DCCV  6. The aortic valve is tricuspid. Aortic valve regurgitation is at least moderate. No holodiastolic flow verseral. R-R variability. No aortic stenosis is present.  7. There is mild (Grade II) plaque involving the descending aorta and ascending aorta. FINDINGS  Left Ventricle: Left ventricular ejection fraction, by estimation, is 55 to 60%. The left ventricle has normal function. The left ventricular internal cavity size was normal in size. Right Ventricle: The right ventricular size is normal. No increase in right ventricular wall thickness. Right ventricular systolic function is normal. Left Atrium: Left atrial size was normal in size. No left atrial/left atrial appendage thrombus was detected. Right Atrium: Right atrial size was normal in size. Pericardium: A moderately sized pericardial effusion is present. There is no evidence of cardiac tamponade. Mitral Valve: The mitral valve is grossly normal. Moderate mitral valve regurgitation. No evidence of mitral valve stenosis. Tricuspid Valve: The tricuspid valve is normal in structure. Tricuspid valve regurgitation is trivial. No evidence of tricuspid stenosis. Aortic Valve: The aortic valve is tricuspid. Aortic valve regurgitation is moderate. No aortic stenosis is present. Pulmonic Valve: The pulmonic valve was not well visualized. Pulmonic valve  regurgitation is not visualized. Aorta: The aortic root, ascending aorta and aortic arch are all structurally normal, with no evidence of dilitation or obstruction. There is mild (Grade II) plaque involving the descending aorta and ascending aorta. IAS/Shunts: The atrial septum is grossly normal. Additional Comments: There is a large pleural effusion in the left lateral region. Spectral Doppler performed. Rudean Haskell MD Electronically signed by Rudean Haskell MD Signature Date/Time: 03/09/2022/6:37:44 PM    Final    DG Abd Portable 1V  Result Date: 03/09/2022 CLINICAL DATA:  Feeding tube placement. EXAM: PORTABLE ABDOMEN - 1 VIEW COMPARISON:  Radiographs earlier the same date and 02/23/2022. CT 10/27/2010. FINDINGS: 1635 hours. The tip of the feeding tube is unchanged from the earlier study, projecting over the left iliac crest and likely in the distal body of the stomach. The visualized bowel gas pattern is nonobstructive. Persistent left lower lobe airspace disease with probable left pleural effusion. IMPRESSION: Unchanged position of the feeding tube, tip likely in the distal body of the stomach. Electronically Signed   By: Richardean Sale M.D.   On: 03/09/2022 16:56   DG Abd Portable 1V  Result Date: 03/09/2022 CLINICAL DATA:  Feeding tube placement EXAM: PORTABLE ABDOMEN - 1 VIEW COMPARISON:  Abdomen 03/08/2022 FINDINGS: Feeding tube tip is in the distal body of the stomach. NG tube is been removed since the prior study. Nonobstructive bowel gas pattern.  Gas in nondilated transverse colon. Surgical clips in the right upper quadrant. Progression of left lower lobe airspace disease. IMPRESSION: Feeding tube tip in the distal body of the stomach. Progression of left lower lobe airspace disease. Electronically Signed   By: Franchot Gallo M.D.   On: 03/09/2022 11:33    Cardiac Studies  Cardiac Studies & Procedures       ECHOCARDIOGRAM  ECHOCARDIOGRAM COMPLETE  03/07/2022  Narrative ECHOCARDIOGRAM REPORT    Patient Name:   Sherry Olson [facility] Date of Exam: 03/07/2022 Medical Rec #:  [id number]       Height:       63.0 in Accession #:    [id number]      Weight:       114.2 lb Date of Birth:  [birth year]        BSA:          1.524 m Patient Age:    76 years        BP:           121/48 mmHg Patient Gender: F               HR:           69 bpm. Exam Location:  Inpatient  Procedure: 2D Echo, Cardiac Doppler and Color Doppler  Indications:    Stroke I63.9  History:        Patient has no prior history of Echocardiogram examinations. CAD, Stroke; Risk Factors:Hypertension and Dyslipidemia.  Sonographer:    Ronny Flurry Referring Phys: [id number] [place]   1. Left ventricular ejection fraction, by estimation, is 60 to 65%. The left ventricle has normal function. The left ventricle has no regional wall motion abnormalities. Left ventricular diastolic parameters were normal. 2. Right ventricular systolic function is normal. The right ventricular size is normal. 3. Moderate but not quite circumferential effusion with dilated IVC Mild diastolic RV collapse on 2D but no variation in mitral inflow Suggest clinical correltation and repeat TTE in 2 weeks . Moderate pericardial effusion. The pericardial effusion is posterior to the left ventricle, lateral to the left ventricle and anterior to the right ventricle. 4. The mitral valve is abnormal. Mild mitral valve regurgitation. No evidence of mitral stenosis. 5. The aortic valve is tricuspid. There is mild calcification of the aortic valve. There is mild thickening of the aortic valve. Aortic valve regurgitation is mild to moderate. Aortic valve sclerosis is present, with no evidence of aortic valve stenosis. 6. The inferior vena cava is dilated in size with >50% respiratory variability, suggesting right atrial pressure of 8 mmHg.  FINDINGS Left Ventricle: Left ventricular ejection  fraction, by estimation, is 60 to 65%. The left ventricle has normal function. The left ventricle has no regional wall motion abnormalities. The left ventricular internal cavity size was normal in size. There is no left ventricular hypertrophy. Left ventricular diastolic parameters were normal.  Right Ventricle: The right ventricular size is normal. No increase in right ventricular wall thickness. Right ventricular systolic function is normal.  Left Atrium: Left atrial size was normal in size.  Right Atrium: Right atrial size was normal in size.  Pericardium: Moderate but not quite circumferential effusion with dilated IVC Mild diastolic RV collapse on 2D but no variation in mitral inflow Suggest clinical correltation and repeat TTE in 2 weeks. A moderately sized pericardial effusion is present. The pericardial effusion is posterior to the left ventricle, lateral to the left ventricle and anterior to the right ventricle.  Mitral Valve: The mitral valve is abnormal. There is mild thickening of the mitral valve leaflet(s). There is mild calcification of the mitral valve leaflet(s). Mild mitral valve regurgitation. No evidence of mitral valve stenosis.  Tricuspid Valve: The tricuspid valve is normal in structure. Tricuspid valve regurgitation is trivial. No evidence of tricuspid stenosis.  Aortic Valve: The aortic valve is tricuspid. There is mild calcification of the aortic valve. There is mild thickening of the aortic valve. Aortic valve regurgitation is mild to moderate. Aortic regurgitation PHT measures 466 msec. Aortic valve sclerosis is present, with no evidence of aortic valve stenosis. Aortic valve mean gradient measures 4.7 mmHg. Aortic valve peak gradient measures 8.9 mmHg. Aortic valve area, by VTI measures 2.33 cm.  Pulmonic Valve: The pulmonic valve was normal in structure. Pulmonic valve regurgitation is not visualized. No evidence of pulmonic stenosis.  Aorta: The aortic root is  normal in size and structure.  Venous: The inferior vena cava is dilated in size with greater than 50% respiratory variability, suggesting right atrial pressure of 8 mmHg.  IAS/Shunts: No atrial level shunt detected by color flow Doppler.   LEFT VENTRICLE PLAX 2D LVIDd:         4.70 cm   Diastology LVIDs:         3.20 cm   LV e' medial:    7.72 cm/s LV PW:         0.90 cm   LV E/e' medial:  10.0 LV IVS:        0.80 cm   LV e' lateral:   9.68 cm/s LVOT diam:     1.90 cm   LV E/e' lateral: 8.0 LV SV:         64 LV SV Index:   42 LVOT Area:     2.84 cm   RIGHT VENTRICLE             IVC RV Basal diam:  3.10 cm     IVC diam: 2.10 cm RV S prime:     13.10 cm/s TAPSE (M-mode): 1.9 cm  LEFT ATRIUM             Index        RIGHT ATRIUM           Index LA diam:        3.70 cm 2.43 cm/m   RA Area:     15.00 cm LA Vol (A2C):   47.6 ml 31.24 ml/m  RA Volume:   37.00 ml  24.28 ml/m LA Vol (A4C):   51.1 ml 33.53 ml/m LA Biplane Vol: 50.2 ml 32.94 ml/m AORTIC VALVE AV Area (Vmax):    2.53 cm AV Area (Vmean):   2.18 cm AV Area (VTI):     2.33 cm AV Vmax:           149.33 cm/s AV Vmean:          98.633 cm/s AV VTI:            0.274 m AV Peak Grad:      8.9 mmHg AV Mean Grad:      4.7 mmHg LVOT Vmax:         133.00 cm/s LVOT Vmean:        75.800 cm/s LVOT VTI:          0.225 m LVOT/AV VTI ratio: 0.82 AI PHT:            466 msec  AORTA Ao Root diam: 3.00 cm  Ao Asc diam:  3.10 cm  MITRAL VALVE MV Area (PHT): 4.40 cm    SHUNTS MV Decel Time: 173 msec    Systemic VTI:  0.22 m MR Peak grad: 103.0 mmHg   Systemic Diam: 1.90 cm MR Mean grad: 68.0 mmHg MR Vmax:      507.50 cm/s MR Vmean:     385.0 cm/s MV E velocity: 77.50 cm/s MV A velocity: 60.30 cm/s MV E/A ratio:  1.29  Charlton Haws MD Electronically signed by Charlton Haws MD Signature Date/Time: 03/07/2022/11:47:30 AM    Final   TEE  ECHO TEE 03/09/2022  Narrative TRANSESOPHOGEAL ECHO REPORT    Patient  Name:   JANASHIA PARCO [facility] Date of Exam: 03/09/2022 Medical Rec #:  [id number]       Height:       63.0 in Accession #:    [id number]      Weight:       135.4 lb Date of Birth:  23-Jun-1945        BSA:          1.638 m Patient Age:    76 years        BP:           125/68 mmHg Patient Gender: F               HR:           83 bpm. Exam Location:  Inpatient  Procedure: Transesophageal Echo, Cardiac Doppler and Color Doppler  Indications:     Afib  History:         Patient has prior history of Echocardiogram examinations, most recent 03/07/2022. CAD; Risk Factors:Hypertension and Dyslipidemia.  Sonographer:     Leta Jungling RDCS Referring Phys:  [id number] [facility] A Izora Ribas Diagnosing Phys: Riley Lam MD  PROCEDURE: After discussion of the risks and benefits of a TEE, an informed consent was obtained from a family member. The patient was intubated. TEE procedure time was 6 minutes. The transesophogeal probe was passed without difficulty through the esophogus of the patient. Imaged were obtained with the patient in a supine position. Sedation performed by performing physician. Patients was under conscious sedation during this procedure. Image quality was good. The patient's vital signs; including heart rate, blood pressure, and oxygen saturation; remained stable throughout the procedure. The patient developed no complications during the procedure. A direct current cardioversion was performed.  IMPRESSIONS   1. Left ventricular ejection fraction, by estimation, is 55 to 60%. The left ventricle has normal function. 2. Right ventricular systolic function is normal. The right ventricular size is normal. 3. No left atrial/left atrial appendage thrombus was detected. 4. Moderate pericardial effusion. There is no evidence of cardiac tamponade. Large pleural effusion in the left lateral region. 5. The mitral valve is grossly normal. At least moderate central mitral valve regurgitation. No  evidence of mitral stenosis. R-R variability, limited images in the setting of emerent TEE/DCCV 6. The aortic valve is tricuspid. Aortic valve regurgitation is at least moderate. No holodiastolic flow verseral. R-R variability. No aortic stenosis is present. 7. There is mild (Grade II) plaque involving the descending aorta and ascending aorta.  FINDINGS Left Ventricle: Left ventricular ejection fraction, by estimation, is 55 to 60%. The left ventricle has normal function. The left ventricular internal cavity size was normal in size.  Right Ventricle: The right ventricular size is normal. No increase in right ventricular wall thickness. Right ventricular systolic function is normal.  Left Atrium: Left atrial  size was normal in size. No left atrial/left atrial appendage thrombus was detected.  Right Atrium: Right atrial size was normal in size.  Pericardium: A moderately sized pericardial effusion is present. There is no evidence of cardiac tamponade.  Mitral Valve: The mitral valve is grossly normal. Moderate mitral valve regurgitation. No evidence of mitral valve stenosis.  Tricuspid Valve: The tricuspid valve is normal in structure. Tricuspid valve regurgitation is trivial. No evidence of tricuspid stenosis.  Aortic Valve: The aortic valve is tricuspid. Aortic valve regurgitation is moderate. No aortic stenosis is present.  Pulmonic Valve: The pulmonic valve was not well visualized. Pulmonic valve regurgitation is not visualized.  Aorta: The aortic root, ascending aorta and aortic arch are all structurally normal, with no evidence of dilitation or obstruction. There is mild (Grade II) plaque involving the descending aorta and ascending aorta.  IAS/Shunts: The atrial septum is grossly normal.  Additional Comments: There is a large pleural effusion in the left lateral region. Spectral Doppler performed.  Rudean Haskell MD Electronically signed by Rudean Haskell MD Signature  Date/Time: 03/09/2022/6:37:44 PM    Final             Patient Profile     76 y.o. female with Afib and embolic stroke  Assessment & Plan     Persistent Atrial Fibrillation Embolic stroke with hemorrhagic conversion; per chart review in the setting of DOAC non-adherence Anemia, worsening suspicion anemia of chronic disease HTN Qtc prolongation - with RVR intermittent RVR - on heparin without new SAH (see 03/09/22 note for more details) - will continue IV amiodarone despite (unclear) allergy given limited options; if she converts over the weekend and this is sustained (she is being considered for trach today) could transition to per tube amiodarone (400 mg PO BID); we have not been able to clarify through admission   Moderate pericardial effusion with out tamponade Moderate AI, Moderate MR - limited echo in ~ 2 weeks; TEE shows unchanged findings 03/09/22   HLD - continue statin  CRITICAL CARE Performed by: Chayne Baumgart A Arthi Mcdonald  Total critical care time: 45 minutes. Critical care time was exclusive of separately billable procedures and treating other patients. Critical care was necessary to treat or prevent imminent or life-threatening deterioration. Critical care was time spent personally by me on the following activities: development of treatment plan with patient and/or surrogate as well as nursing, discussions with consultants, evaluation of patient's response to treatment, examination of patient, obtaining history from patient or surrogate, ordering and performing treatments and interventions, ordering and review of laboratory studies, ordering and review of radiographic studies, pulse oximetry and re-evaluation of patient's condition. Discussed with daughter [name], ICU team, Stroke team.   Signed, Rudean Haskell, [place]  03/11/2022 7:47 AM   We will be transitioning coverage to another of my partners; please reach out through the [facility]  system for questions or concerns. Weekend Coverage: likely telemetry check; please call for questions/concerns   For questions or updates, please contact [facility] Please consult [email or web address] for contact info under Cardiology/STEMI.      Rudean Haskell, MD FASE [address], [place] [address] [phone]  7:47 AM

## 2022-03-11 NOTE — Progress Notes (Addendum)
STROKE TEAM PROGRESS NOTE   INTERVAL HISTORY Patient seen in room with no family at the bedside. Remains intubated, but more awake alert, eyes open and following commands. Still has afib, but heart rate is in the 100-140s today. CCM to do trach today. Spoke with Sherry Olson (Daughter) about trach and neurological recovery and they are willing to proceed with trach today.   Vitals:   03/11/22 0800 03/11/22 0831 03/11/22 0832 03/11/22 0900  BP: 128/69 (!) 134/55  131/68  Pulse: (!) 104 (!) 133  (!) 127  Resp: (!) 23 (!) 22  (!) 23  Temp:      TempSrc:      SpO2: 100% 99% 99% 100%  Weight:      Height:       CBC:  Recent Labs  Lab 02/28/2022 1500 03/05/2022 1505 03/09/22 0605 03/10/22 0645  WBC 11.1*   < > 9.1 11.6*  NEUTROABS 8.1*  --   --   --   HGB 14.2   < > 9.7* 10.0*  HCT 42.5   < > 29.4* 29.8*  MCV 85.3   < > 90.2 86.9  PLT 104*   < > 95* 142*   < > = values in this interval not displayed.    Basic Metabolic Panel:  Recent Labs  Lab 03/09/22 0605 03/09/22 1648 03/10/22 0645  NA 141  --  139  K 4.1  --  3.9  CL 114*  --  110  CO2 19*  --  20*  GLUCOSE 110*  --  153*  BUN 11  --  13  CREATININE 0.58  --  0.58  CALCIUM 8.3*  --  8.1*  MG 1.9 1.9 1.8  PHOS 2.9 3.1 2.5    Lipid Panel:  Recent Labs  Lab 03/07/22 0547  CHOL 114  TRIG 125  HDL 32*  CHOLHDL 3.6  VLDL 25  LDLCALC 57    HgbA1c:  Recent Labs  Lab 03/19/2022 1500  HGBA1C 5.0    Urine Drug Screen: No results for input(s): "LABOPIA", "COCAINSCRNUR", "LABBENZ", "AMPHETMU", "THCU", "LABBARB" in the last 168 hours.  Alcohol Level  Recent Labs  Lab 03/11/2022 1500  ETH <10     IMAGING past 24 hours DG CHEST PORT 1 VIEW  Result Date: 03/11/2022 CLINICAL DATA:  [id number] History of ETT [id number] EXAM: PORTABLE CHEST 1 VIEW COMPARISON:  March 09, 2022 March 06, 2022 FINDINGS: Evaluation is limited by patient rotation. The cardiomediastinal silhouette is unchanged in contour.Atherosclerotic  calcifications of the aorta. The enteric tube courses through the chest to the abdomen beyond the field-of-view. ETT tip terminates approximately 4.3 cm above the carina. Probable small LEFT pleural effusion. No pneumothorax. Heterogeneous opacification of the LEFT lung the increased since March 06, 2022 with increased confluent opacity of the LEFT lung base. Visualized abdomen is unremarkable. IMPRESSION: 1.  Support apparatus as described above. 2. Worsening airspace opacity of the LEFT lung Electronically Signed   By: Valentino Saxon M.D.   On: 03/11/2022 08:33   [place] EKG SITE RITE  Result Date: 03/11/2022 If Site Rite image not attached, placement could not be confirmed due to current cardiac rhythm.   PHYSICAL EXAM  Temp:  [98.1 F (36.7 C)-99.8 F (37.7 C)] 98.1 F (36.7 C) (11/17 0400) Pulse Rate:  [61-136] 127 (11/17 0900) Resp:  [17-27] 23 (11/17 0900) BP: (105-134)/(45-72) 131/68 (11/17 0900) SpO2:  [97 %-100 %] 100 % (11/17 0900) FiO2 (%):  [40 %] 40 % (11/17 0832) Weight:  [  61.3 kg] 61.3 kg (11/17 0444)  General - Well nourished, well developed, intubated on precedex.  Ophthalmologic - fundi not visualized due to noncooperation.  Cardiovascular - irregularly irregular heart rate and rhythm  Neuro - intubated on precedex, eyes open, following simple commands on the right but psychomotor slowing. Eyes in mid position, inconsistently blinks to threat on the right only, doll's eyes present, not tracking, PERRL. Corneal reflex present, gag and cough present. Facial symmetry not able to test due to ET tube.  Tongue protrusion not cooperative. RUE and RLE 3/5 against gravity, LUE flaccid. LLE slight withdraw to pain. Sensation, coordination not cooperative and gait not tested.   ASSESSMENT/PLAN [name] is a 76 y.o. female with history of DVT, HTN, HLD, Afibb on Eliquis who presents with sudden onset left-sided weakness, right gaze deviation. Mechanical thrombectomy  done 11/12 with TICI2c revascularization. Remained intubated post procedure, MRI done and shows bilateral hemisphere strokes. According the the patients daughter she started feeling poorly on Saturday and likely stopped taking her medicine that day, including her xarelto.   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  CTA head & neck Distal right M1 MCA occlusion.  Post IR CT Hyperdense material in the right temporal and frontal lobe, which appears similar to the post thrombectomy CT and most likely represents contrast staining, although superimposed hemorrhage cannot be excluded. Redemonstrated hypodensity in the bilateral frontal lobes, right parietal lobe, and right occipital lobe, consistent with infarcts. Previously noted cerebellar hypodensity is less conspicuous on this exam. MRI  Scattered and confluent areas of restricted diffusion in the bilateral cerebral and cerebellar hemispheres, consistent with acute infarcts, likely embolic.  Small amount of subarachnoid hemorrhage in the right temporoparietal occipital region, bilateral occipital horns and along the tentorium.  MRA  R MCA patent now 2D Echo EF 60-65%  LDL 57 HgbA1c 5.0 VTE prophylaxis - heparin subq Eliquis (apixaban) daily prior to admission, now on heparin IV.  Therapy recommendations:  pending Disposition:  pending  Atrial fibrillation RVR Home meds: Eliquis, sotalol Not compliant with eliquis, at least 2 days out of eliquis RVR overnight, HR up to 180s->150s - cardiology on board On amiodarone and cardizem drip S/p high risk TEE/DCCV 03/09/22 Now in and out of afib but rate controlled On heparin IV- brief on hold for trach  procedure  Hypotension Hx of HTN Home meds:  amlodipine, metoprolol succinate, benazepril Unstable- was requiring vasopressors On low dose pressor this am BP goal 100-160 Long-term BP goal normotensive  Respiratory failure Possible CAP Intubated Was on weaning but tachypnea 11/17  Vent management per CCM on precedex On rocephin and azithromycin Discussed with family, trach to be done 11/17  Hyperlipidemia Home meds:  Atorvastatin 10mg , resumed in hospital LDL 57, goal < 70 Continue statin on discharge  Other Stroke Risk Factors Advanced Age >/= 97  Hx of DVT  Other Active Problems Dysphagia, OG in place on TF   Hospital day # 5   Patient seen and examined by NP/APP with MD. MD to update note as needed.   75, DNP, FNP-BC [facility] Pager: [phone]  ATTENDING NOTE: I reviewed above note and agree with the assessment and plan. Pt was seen and  examined.   No family at the bedside. Pt still intubated on precedex. More lethargic than yesterday, not quite following commends on the right. Tachypnea overnight, HR fluctuates 80s-140s. Continued on amiodarone, appreciate cardiology assistance. Dr. Lake Bells recommend trach for further management. Our NP [place] discussed with family over the phone, they have consented for the procedure. Continue heparin IV, statin and TF.   For detailed assessment and plan, please refer to above/below as I have made changes wherever appropriate.   Rosalin Hawking, MD PhD Stroke Neurology 03/11/2022 11:08 PM  This patient is critically ill due to large RMCA infarct, s/p IR, afib RVR, respiratory failure and at significant risk of neurological worsening, death form recurrent stroke, heart failure, hemorrhagic conversion, seizure. This patient's care requires constant monitoring of vital signs, hemodynamics, respiratory and cardiac monitoring, review of multiple databases, neurological assessment, discussion with family,  other specialists and medical decision making of high complexity. I spent 35 minutes of neurocritical care time in the care of this patient. I discussed with Dr. Ardyth Harps CCM and Dr. Gasper Sells cardiology.     To contact Stroke Continuity provider, please refer to [email or web address] After hours, contact General Neurology

## 2022-03-12 DIAGNOSIS — I63511 Cerebral infarction due to unspecified occlusion or stenosis of right middle cerebral artery: Secondary | ICD-10-CM | POA: Diagnosis not present

## 2022-03-12 DIAGNOSIS — I480 Paroxysmal atrial fibrillation: Secondary | ICD-10-CM | POA: Diagnosis not present

## 2022-03-12 LAB — BASIC METABOLIC PANEL
Anion gap: 10 (ref 5–15)
BUN: 14 mg/dL (ref 8–23)
CO2: 25 mmol/L (ref 22–32)
Calcium: 8.5 mg/dL — ABNORMAL LOW (ref 8.9–10.3)
Chloride: 106 mmol/L (ref 98–111)
Creatinine, Ser: 0.58 mg/dL (ref 0.44–1.00)
GFR, Estimated: >60 mL/min (ref >60)
Glucose, Bld: 141 mg/dL — ABNORMAL HIGH (ref 70–99)
Potassium: 3.3 mmol/L — ABNORMAL LOW (ref 3.5–5.1)
Sodium: 141 mmol/L (ref 135–145)

## 2022-03-12 LAB — CBC
HCT: 29.9 % — ABNORMAL LOW (ref 36.0–46.0)
Hemoglobin: 9.9 g/dL — ABNORMAL LOW (ref 12.0–15.0)
MCH: 28.8 pg (ref 26.0–34.0)
MCHC: 33.1 g/dL (ref 30.0–36.0)
MCV: 86.9 fL (ref 80.0–100.0)
Platelets: 196 10*3/uL (ref 150–400)
RBC: 3.44 MIL/uL — ABNORMAL LOW (ref 3.87–5.11)
RDW: 14.1 % (ref 11.5–15.5)
WBC: 12.3 10*3/uL — ABNORMAL HIGH (ref 4.0–10.5)
nRBC: 0 % (ref 0.0–0.2)

## 2022-03-12 LAB — HEPARIN LEVEL (UNFRACTIONATED)
Heparin Unfractionated: 0.24 IU/mL — ABNORMAL LOW (ref 0.30–0.70)
Heparin Unfractionated: 0.27 IU/mL — ABNORMAL LOW (ref 0.30–0.70)
Heparin Unfractionated: 0.4 IU/mL (ref 0.30–0.70)

## 2022-03-12 LAB — GLUCOSE, CAPILLARY
Glucose-Capillary: 132 mg/dL — ABNORMAL HIGH (ref 70–99)
Glucose-Capillary: 124 mg/dL — ABNORMAL HIGH (ref 70–99)
Glucose-Capillary: 109 mg/dL — ABNORMAL HIGH (ref 70–99)
Glucose-Capillary: 122 mg/dL — ABNORMAL HIGH (ref 70–99)
Glucose-Capillary: 132 mg/dL — ABNORMAL HIGH (ref 70–99)
Glucose-Capillary: 159 mg/dL — ABNORMAL HIGH (ref 70–99)

## 2022-03-12 LAB — MAGNESIUM: Magnesium: 1.6 mg/dL — ABNORMAL LOW (ref 1.7–2.4)

## 2022-03-12 LAB — TRIGLYCERIDES: Triglycerides: 87 mg/dL (ref <150)

## 2022-03-12 MED ORDER — FUROSEMIDE 10 MG/ML IJ SOLN
20.0000 mg | Freq: Once | INTRAMUSCULAR | Status: AC
  Administered 2022-03-12: 20 mg via INTRAVENOUS
  Filled 2022-03-12: qty 2

## 2022-03-12 MED ORDER — BISACODYL 10 MG RE SUPP: 10.0000 mg | Freq: Every day | RECTAL | Status: DC | PRN

## 2022-03-12 MED ORDER — LACTATED RINGERS IV BOLUS
250.0000 mL | Freq: Once | INTRAVENOUS | Status: AC
  Administered 2022-03-12: 250 mL via INTRAVENOUS

## 2022-03-12 MED ORDER — FENTANYL CITRATE PF 50 MCG/ML IJ SOSY
25.0000 ug | PREFILLED_SYRINGE | INTRAMUSCULAR | Status: DC | PRN
  Administered 2022-03-12 – 2022-03-15 (×2): 25 ug via INTRAVENOUS
  Filled 2022-03-12 (×2): qty 1

## 2022-03-12 MED ORDER — POTASSIUM CHLORIDE 20 MEQ PO PACK
40.0000 meq | PACK | Freq: Once | ORAL | Status: AC
  Administered 2022-03-12: 40 meq
  Filled 2022-03-12: qty 2

## 2022-03-12 MED ORDER — MAGNESIUM SULFATE 2 GM/50ML IV SOLN
2.0000 g | Freq: Once | INTRAVENOUS | Status: AC
  Administered 2022-03-12: 2 g via INTRAVENOUS
  Filled 2022-03-12: qty 50

## 2022-03-12 NOTE — Progress Notes (Signed)
ANTICOAGULATION CONSULT NOTE  Pharmacy Consult:  Heparin Indication: atrial fibrillation  Allergies  Allergen Reactions   Amiodarone Palpitations    "Heart trouble" per patient's daughter and husband.   Colesevelam Nausea And Vomiting   Welchol [Colesevelam Hcl] Diarrhea and Nausea And Vomiting    Patient Measurements: Height: 5\' 3"  (160 cm) Weight: 64 kg (141 lb 1.5 oz) IBW/kg (Calculated) : 52.4 Heparin Dosing Weight: 61 kg  Vital Signs: Temp: 99 F (37.2 C) (11/18 1200) Temp Source: Axillary (11/18 1200) BP: 148/75 (11/18 1300) Pulse Rate: 134 (11/18 1300)  Labs: Recent Labs    03/09/22 1740 03/09/22 2254 03/10/22 0645 03/10/22 1046 03/10/22 1945 03/12/22 0327 03/12/22 1255  HGB  --   --  10.0*  --   --  9.9*  --   HCT  --   --  29.8*  --   --  29.9*  --   PLT  --   --  142*  --   --  196  --   APTT  --  67*  --   --   --   --   --   LABPROT 15.4*  --   --   --   --   --   --   INR 1.2  --   --   --   --   --   --   HEPARINUNFRC  --  0.45  --    < > 0.29* 0.24* 0.27*  CREATININE  --   --  0.58  --   --  0.58  --    < > = values in this interval not displayed.     Estimated Creatinine Clearance: 53 mL/min (by C-G formula based on SCr of 0.58 mg/dL).   Assessment: 55 YOF with Afib on Eliquis PTA, last dose unknown.  Patient has an acute stroke with a small SAH s/p revascularization on 11/12.  She has been in Afib RVR and pharmacy consulted to dose IV heparin for cardioversion on 11/15.  D/w neuro-okay to continue heparin drip.  Heparin level sub-therapeutic at 0.27 units/mL.  Patient has bruising but no overt bleeding; no issue with heparin infusion.  Goal of Therapy:  Heparin level 0.3-0.5 units/ml Monitor platelets by anticoagulation protocol: Yes   Plan:  Increase heparin gtt to 1100 units/hr. Check heparin level in 8 hrs Daily heparin level and CBC.  Marielys Trinidad D. 12/15, PharmD, BCPS, BCCCP 03/12/2022, 1:44 PM

## 2022-03-12 NOTE — Progress Notes (Addendum)
STROKE TEAM PROGRESS NOTE   INTERVAL HISTORY  Trach done yesterday. Still having some episodes of RVR, but less frequent today. Remains on amiodarone drip and neo. She is following commands and mouthing words.   Vitals:   03/12/22 0745 03/12/22 0755 03/12/22 0800 03/12/22 0815  BP: (!) 126/47  (!) 123/46 (!) 133/46  Pulse: 70 69 67 77  Resp: 20 20 20  (!) 26  Temp:      TempSrc:      SpO2: 100% 100% 100% 100%  Weight:      Height:       CBC:  Recent Labs  Lab March 18, 2022 1500 03/18/22 1505 03/10/22 0645 03/12/22 0327  WBC 11.1*   < > 11.6* 12.3*  NEUTROABS 8.1*  --   --   --   HGB 14.2   < > 10.0* 9.9*  HCT 42.5   < > 29.8* 29.9*  MCV 85.3   < > 86.9 86.9  PLT 104*   < > 142* 196   < > = values in this interval not displayed.    Basic Metabolic Panel:  Recent Labs  Lab 03/09/22 1648 03/10/22 0645 03/11/22 1700 03/12/22 0327  NA  --  139  --  141  K  --  3.9  --  3.3*  CL  --  110  --  106  CO2  --  20*  --  25  GLUCOSE  --  153*  --  141*  BUN  --  13  --  14  CREATININE  --  0.58  --  0.58  CALCIUM  --  8.1*  --  8.5*  MG 1.9 1.8 1.7 1.6*  PHOS 3.1 2.5  --   --     Lipid Panel:  Recent Labs  Lab 03/07/22 0547 03/12/22 0327  CHOL 114  --   TRIG 125 87  HDL 32*  --   CHOLHDL 3.6  --   VLDL 25  --   LDLCALC 57  --     HgbA1c:  Recent Labs  Lab 2022/03/18 1500  HGBA1C 5.0    Urine Drug Screen: No results for input(s): "LABOPIA", "COCAINSCRNUR", "LABBENZ", "AMPHETMU", "THCU", "LABBARB" in the last 168 hours.  Alcohol Level  Recent Labs  Lab 03-18-22 1500  ETH <10     IMAGING past 24 hours DG CHEST PORT 1 VIEW  Result Date: 03/11/2022 CLINICAL DATA:  03/13/2022 Tracheostomy tube present [facility] EXAM: PORTABLE CHEST 1 VIEW COMPARISON:  March 11, 2022 FINDINGS: The cardiomediastinal silhouette is unchanged in contour.Interval tracheostomy. RIGHT upper extremity PICC tip terminating over the superior cavoatrial junction. The enteric tube  courses through the chest to the abdomen beyond the field-of-view. Favored smallLEFT pleural effusion, similar in comparison to prior. No pneumothorax. Persistent LEFT-sided airspace opacities. RIGHT lung is clear. IMPRESSION: 1. Interval tracheostomy. 2. Persistent LEFT-sided airspace opacities and favored small LEFT pleural effusion. Electronically Signed   By: March 13, 2022 M.D.   On: 03/11/2022 15:58   03/13/2022 EKG SITE RITE  Result Date: 03/11/2022 If Site Rite image not attached, placement could not be confirmed due to current cardiac rhythm.   PHYSICAL EXAM  Temp:  [97.6 F (36.4 C)-98.7 F (37.1 C)] 98.7 F (37.1 C) (11/18 0400) Pulse Rate:  [60-140] 77 (11/18 0815) Resp:  [16-32] 26 (11/18 0815) BP: (89-147)/(42-116) 133/46 (11/18 0815) SpO2:  [93 %-100 %] 100 % (11/18 0815) FiO2 (%):  [40 %] 40 % (11/18 0755) Weight:  [64 kg] 64  kg (11/18 0427)  General - Well nourished, well developed, intubated on precedex.  Ophthalmologic - fundi not visualized due to noncooperation.  Cardiovascular - irregularly irregular heart rate and rhythm  Neuro - On precedex, eyes open, following simple commands on the right but psychomotor slowing. Mouthing words. Eyes in mid position, inconsistently blinks to threat on the right only, doll's eyes present, not tracking, PERRL. Corneal reflex present, gag and cough present. Facial symmetry not able to test due to ET tube.  Tongue protrusion not cooperative. RUE and RLE 3/5 against gravity, LUE flaccid. LLE slight withdraw to pain. Sensation, coordination not cooperative and gait not tested.   ASSESSMENT/PLAN Ms. KEYMYA QUINLAN is a 76 y.o. female with history of DVT, HTN, HLD, Afibb on Eliquis who presents with sudden onset left-sided weakness, right gaze deviation. Mechanical thrombectomy done 11/12 with TICI2c revascularization. Remained intubated post procedure, MRI done and shows bilateral hemisphere strokes. According the the patients daughter  she started feeling poorly on Saturday and likely stopped taking her medicine that day, including her xarelto.   Stroke: right MCA large infarct with scattered areas of infarct in bilateral cerebral and cerebellar with right M1 occlusion s/p IR with TICI3, likely embolic due to afib non compliant with eliquis  Code Stroke CT head- findings concerning for acute infarcts in bilateral frontal lobes, right parietal and occipital lobes and bilateral cerebellum.  CTA head & neck Distal right M1 MCA occlusion.  Post IR CT Hyperdense material in the right temporal and frontal lobe, which appears similar to the post thrombectomy CT and most likely represents contrast staining, although superimposed hemorrhage cannot be excluded. Redemonstrated hypodensity in the bilateral frontal lobes, right parietal lobe, and right occipital lobe, consistent with infarcts. Previously noted cerebellar hypodensity is less conspicuous on this exam.  MRI  Scattered and confluent areas of restricted diffusion in the bilateral cerebral and cerebellar hemispheres, consistent with acute infarcts, likely embolic.  Small amount of subarachnoid hemorrhage in the right temporoparietal occipital region, bilateral occipital horns and along the tentorium.  MRA  R MCA patent now 2D Echo EF 60-65%  LDL 57 HgbA1c 5.0 VTE prophylaxis - heparin subq Eliquis (apixaban) daily prior to admission, on heparin IV.  Therapy recommendations:  SNF Disposition:  pending  Atrial fibrillation RVR Home meds: Eliquis, sotalol Not compliant with eliquis, at least 2 days out of eliquis RVR overnight, HR up to 180s->150s - cardiology on board On amiodarone and cardizem drip S/p high risk TEE/DCCV 03/09/22 Now in and out of afib but rate controlled On heparin IV- brief on hold for trach procedure  Hypotension Hx of HTN Home meds:  amlodipine, metoprolol succinate, benazepril Unstable- was requiring vasopressors On low dose pressor this am BP  goal 100-160 Long-term BP goal normotensive  Respiratory failure Possible CAP Intubated -> Trached 11/17  Vent management per CCM on precedex On rocephin and azithromycin-now stopped Discussed with family, trach to be done 11/17  Hyperlipidemia Home meds:  Atorvastatin 10mg , resumed in hospital LDL 57, goal < 70 Continue statin on discharge  Other Stroke Risk Factors Advanced Age >/= 36  Hx of DVT  Other Active Problems Dysphagia, OG in place on TF Hypokalemia- K 3.3 replaced   Hospital day # 6   Patient seen and examined by NP/APP with MD. MD to update note as needed.   Janine Ores, DNP, FNP-BC [facility] Pager: [phone]  ATTENDING ATTESTATION:  76 year old with right M1 occlusion status post IR prior history of being on  Eliquis for atrial fibrillation now on IV heparin until procedure completed and then transition back.  She is status post trach.  Cardiology following for A-fib with RVR requiring amiodarone drip and transition if stable over the weekend to p.o. potassium and magnesium replaced.  On exam she is awake and alert.  Exam as above.  She attempts to mouth words but unable to produce speech.  She can follow commands with some delay.  Overall improving.  Continues to be on Neo, amiodarone Precedex heparin drip.  CCM predicting the long wean from the vent, Lasix today for oliguria and fluids restarted.  Given 250 bolus overnight of lactated Ringer's by eICU  WBC trending up slightly  will continue to monitor.  No fevers overnight.  Chest x-ray with atelectasis but no effusion.  Off antibiotics.  Appreciate CCM and cardiology assistance.  Dr. Reeves Forth evaluated pt independently, reviewed imaging, chart, labs. Discussed and formulated plan with the Resident/APP. Changes were made to the note where appropriate. Please see APP/resident note above for details.    This patient is critically ill due to large RMCA infarct, s/p IR, afib RVR, respiratory  failure and at significant risk of neurological worsening, death form recurrent stroke, heart failure, hemorrhagic conversion, seizure. This patient's care requires constant monitoring of vital signs, hemodynamics, respiratory and cardiac monitoring, review of multiple databases, neurological assessment, discussion with family, other specialists and medical decision making of high complexity. I spent 35 minutes of neurocritical care time in the care of this patient.   Shalana Jardin,MD     To contact Stroke Continuity provider, please refer to [email or web address] After hours, contact General Neurology

## 2022-03-12 NOTE — Progress Notes (Signed)
ANTICOAGULATION CONSULT NOTE  Pharmacy Consult:  Heparin Indication: atrial fibrillation Brief A/P: Heparin level subtherapeutic Increase Heparin rate  Allergies  Allergen Reactions   Amiodarone Palpitations    "Heart trouble" per patient's daughter and husband.   Colesevelam Nausea And Vomiting   Welchol [Colesevelam Hcl] Diarrhea and Nausea And Vomiting    Patient Measurements: Height: 5\' 3"  (160 cm) Weight: 61.3 kg (135 lb 2.3 oz) IBW/kg (Calculated) : 52.4 Heparin Dosing Weight: 61 kg  Vital Signs: Temp: 98.7 F (37.1 C) (11/18 0400) Temp Source: Oral (11/18 0400) BP: 124/67 (11/18 0330) Pulse Rate: 63 (11/18 0330)  Labs: Recent Labs    03/09/22 0605 03/09/22 0605 03/09/22 1740 03/09/22 2254 03/10/22 0645 03/10/22 1046 03/10/22 1945 03/12/22 0327  HGB 9.7*  --   --   --  10.0*  --   --  9.9*  HCT 29.4*  --   --   --  29.8*  --   --  29.9*  PLT 95*  --   --   --  142*  --   --  196  APTT  --   --   --  67*  --   --   --   --   LABPROT  --   --  15.4*  --   --   --   --   --   INR  --   --  1.2  --   --   --   --   --   HEPARINUNFRC  --    < >  --  0.45  --  0.26* 0.29* 0.24*  CREATININE 0.58  --   --   --  0.58  --   --   --    < > = values in this interval not displayed.     Estimated Creatinine Clearance: 48.7 mL/min (by C-G formula based on SCr of 0.58 mg/dL).  Assessment: 76 yo female with h/o Afib, Eliquis on hold for heparin  Goal of Therapy:  Heparin level 0.3-0.5 units/mL Monitor platelets by anticoagulation protocol: Yes   Plan:  Increase Heparin 1000 units/hr  62, PharmD, BCPS  03/12/2022, 4:07 AM

## 2022-03-12 NOTE — Progress Notes (Signed)
Sherry Olson, MRN:  [id number], DOB:  January 24, 1946, LOS: 6 ADMISSION DATE:  02/24/2022, CONSULTATION DATE:  02/23/2022 REFERRING MD: Derry Lory - Neuro, CHIEF COMPLAINT:  CVA   History of Present Illness:  76 year old woman who presented to [facility] ED 11/12 via EMS as Code Stroke with reported L-sided weakness and facial droop. LKW 1345 11/12. Intermittent confusion x 2-3 days with some hallucinations. PMHx significant for HTN, HLD, CAD, history of DVT, AF (on Eliquis)  Found to have R MCA M1 segment occlusion, s/p mechanical thrombectomy with revascularization.  Achieved TICI 2C revascularization.  PCCM consulted.  Pertinent Medical History:   Past Medical History:  Diagnosis Date   Coronary artery disease    DVT (deep venous thrombosis) (HCC)    HLD (hyperlipidemia)    Hypertension    Significant Hospital Events: Including procedures, antibiotic start and stop dates in addition to other pertinent events   11/12 Code Stroke CT concerning for acute infarcts in bilateral frontal lobes, right parietal and occipital lobes and bilateral cerebellum. Given involvement of multiple vascular territories, consider embolic etiology. Also, recommend MRI to further characterize. 2. No acute hemorrhage. 11/13 tachycardia overnight. Briefly in AF requiring diltiazem, but converted back to NS. Dilt turned off. Phenylephrine needed for post IR BP goals. MRI 11/13 Scattered and confluent areas of restricted diffusion in the bilateral cerebral and cerebellar hemispheres, consistent with acute infarcts, likely embolic. Small SAH in the right temporoparietal, bilateral occipital horns, and along the tentorium.  Echo 11/13 LVEF 60-65%, no RWMAs, normal RV systolic function, moderate pericardial effusion with dilated IVC, mild diastolic RV collapse, +MR 11/15 Afib with RVR overnight to 140s. TEE completed, EF 55-60%, no LAA thrombus. DCCV x 2 with no conversion to NSR. Dilt gtt initiated. 11/16 Ongoing  Afib, improved rates to 70s-80s with spikes into 120s-130s less frequent. Dilt gtt discontinued. Ongoing amiodarone. Briefly weaned on vent x 2 but became tachypneic. No significant effusion on bedside ultrasound 11/17 Intermittent NSR/Afib with rates 80s-130s. Amiodarone ongoing. Weaned on vent 10/5, but concern patient is still too weak to safely extubate. Tracheostomy placed. PICC to be placed today.   Interim History / Subjective:   Tracheostomy placed More awake today Remains in atrial fibrillation but in general rate control has been better PSV for 3 hours this morning Oliguric Fluids started this morning K, Mg replaced this morning   Objective:  Blood pressure 134/65, pulse 72, temperature 99.4 F (37.4 C), temperature source Axillary, resp. rate 20, height 5\' 3"  (1.6 m), weight 64 kg, SpO2 100 %.    Vent Mode: PRVC FiO2 (%):  [40 %] 40 % Set Rate:  [16 bmp] 16 bmp Vt Set:  [410 mL] 410 mL PEEP:  [5 cmH20] 5 cmH20 Pressure Support:  [10 cmH20] 10 cmH20 Plateau Pressure:  [18 cmH20-20 cmH20] 19 cmH20   Intake/Output Summary (Last 24 hours) at 03/12/2022 1044 Last data filed at 03/12/2022 1000 Gross per 24 hour  Intake 4486.87 ml  Output 503 ml  Net 3983.87 ml   Filed Weights   03/10/22 0500 03/11/22 0444 03/12/22 0427  Weight: 61.5 kg 61.3 kg 64 kg   Physical Examination:  General:  In bed on vent HENT: NCAT tracheostomy in place PULM: Rhonchi left > R B, vent supported breathing CV: RRR, no mgr GI: BS+, soft, nontender MSK: normal bulk and tone Neuro: awake on vent, moves right sided Derm: edema arms, legs   Resolved Hospital Problem List:     Assessment & Plan:  Acute ischemic stroke with MCA occlusion on the right post revascularization [facility] Atrial fibrillation with fast ventricular response in the 170s and 180s Chronic AF on Eliquis Acute hypoxemic respiratory failure: stroke, CAP Aspiration pneumonia left Abnormal CXR> did not see effusion on  left 11/16 Hypertension CAD History of DVT (2012) followed by vascular in [place]. AC stopped in 2019. Nutrition  Tracheostomy status Anasarca  Discussion: More awake today, volume overloaded.   Suspect it will take days to weeks for her to wean off vent  Plan: Pressure support as long as tolerated Pulm toilette Trach care per routine Full mechanical vent support VAP prevention Daily WUA/SBT Continue amiodarone Wean off sedation Monitor off antibiotics Continue to monitor left chest PRN with ultrasound Lasix today In/out cath today PT/OT efforts Heparin infusion Monitor hemodynamics   Best Practice (right click and "Reselect all SmartList Selections" daily)   Diet/type: NPO TF DVT prophylaxis: prophylactic heparin  GI prophylaxis: PPI Lines: N/A Foley:  N/A Code Status:  full code Last date of multidisciplinary goals of care discussion [11/17, per Stroke team]  Critical care time: 33 minutes   The patient is critically ill with multiple organ system failure and requires high complexity decision making for assessment and support, frequent evaluation and titration of therapies, advanced monitoring, review of radiographic studies and interpretation of complex data.   Critical Care Time devoted to patient care services, exclusive of separately billable procedures, described in this note is 33 minutes.   Roselie Awkward, MD  PCCM Pager: [phone] Cell: [phone] After 7:00 pm call Elink  [phone]

## 2022-03-12 NOTE — Evaluation (Signed)
Physical Therapy Evaluation Patient Details Name: Sherry Olson MRN: [id number] DOB: 1946/04/17 Today's Date: 03/12/2022  History of Present Illness  Pt is a 76 y.o. female who presented to the ED with sudden onset L side weakness, facial droop, and R gaze. 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 Pt underwent thrombectomy11/12 and remained intubated after procedure. Trach placement 11/17. Hospitalization also complicated by a fib with RVR. PMH:  HTN, HLD, CAD, history of DVT, AF (on Eliquis)   Clinical Impression  Pt admitted with above diagnosis. Pt from home with husband. Unsure of prior mobility status. Pt currently with functional limitations due to the deficits listed below (see PT Problem List). On eval, she required total assist rolling R/L. Bed placed in chair position, but pt unable to initiate pulling forward to sit. Flaccid LUE. No active movement noted LLE. Pt able to actively move RUE/LE, grossly 2/5. R gaze preference. Inability to visually cross midline. Alert and following simple commands. On vent support via trach. Pt will benefit from skilled PT to increase their independence and safety with mobility to allow discharge to the venue listed below.          Recommendations for follow up therapy are one component of a multi-disciplinary discharge planning process, led by the attending physician.  Recommendations may be updated based on patient status, additional functional criteria and insurance authorization.  Follow Up Recommendations Skilled nursing-short term rehab (<3 hours/day) Can patient physically be transported by private vehicle: No    Assistance Recommended at Discharge Frequent or constant Supervision/Assistance  Patient can return home with the following  Two people to help with walking and/or transfers;Two people to help with bathing/dressing/bathroom;Assist for transportation     Equipment Recommendations Other (comment) (TBD)  Recommendations for Other Services       Functional Status Assessment Patient has had a recent decline in their functional status and demonstrates the ability to make significant improvements in function in a reasonable and predictable amount of time.     Precautions / Restrictions Precautions Precautions: Other (comment) Precaution Comments: vent via trach, cortrak, flexiseal, PICC line Restrictions Weight Bearing Restrictions: No      Mobility  Bed Mobility Overal bed mobility: Needs Assistance Bed Mobility: Rolling Rolling: Total assist         General bed mobility comments: rolling R/L for line/linen management. Bed placed in chair position. Pt unable to initiate pulling forward to sit.    Transfers                        Ambulation/Gait                  Stairs            Wheelchair Mobility    Modified Rankin (Stroke Patients Only) Modified Rankin (Stroke Patients Only) Pre-Morbid Rankin Score: Slight disability Modified Rankin: Severe disability     Balance                                             Pertinent Vitals/Pain Pain Assessment Facial Expression: Relaxed, neutral Body Movements: Absence of movements Muscle Tension: Relaxed Compliance with ventilator (intubated pts.): Tolerating ventilator or movement Vocalization (extubated pts.): N/A CPOT Total: 0    Home Living Family/patient expects to be  discharged to:: Private residence Living Arrangements: Spouse/significant other Available Help at Discharge: Family Type of Home: House             Additional Comments: Pt unable to provide history. No family present. Above information taken from previous admission (Nov 2022).    Prior Function                       Hand Dominance        Extremity/Trunk Assessment   Upper Extremity Assessment Upper Extremity Assessment: LUE  deficits/detail;RUE deficits/detail RUE Deficits / Details: able to squeeze therapist's hand, active movement noted but weak, edema noted LUE Deficits / Details: flaccid    Lower Extremity Assessment Lower Extremity Assessment: LLE deficits/detail;RLE deficits/detail RLE Deficits / Details: grossly 2/5 LLE Deficits / Details: no active movement noted       Communication      Cognition Arousal/Alertness: Awake/alert Behavior During Therapy: Flat affect Overall Cognitive Status: Difficult to assess                                 General Comments: Following simple commands: squeeze my hand, close your eyes, kick your foot        General Comments General comments (skin integrity, edema, etc.): Vent via trach with sedation being weaned. VSS. HR in 80s.    Exercises     Assessment/Plan    PT Assessment Patient needs continued PT services  PT Problem List Decreased strength;Decreased balance;Decreased mobility;Decreased activity tolerance;Cardiopulmonary status limiting activity       PT Treatment Interventions DME instruction;Functional mobility training;Balance training;Patient/family education;Therapeutic activities;Neuromuscular re-education;Therapeutic exercise    PT Goals (Current goals can be found in the Care Plan section)  Acute Rehab PT Goals Patient Stated Goal: not stated PT Goal Formulation: Patient unable to participate in goal setting Time For Goal Achievement: 03/26/22 Potential to Achieve Goals: Fair    Frequency Min 3X/week     Co-evaluation               AM-PAC PT "6 Clicks" Mobility  Outcome Measure Help needed turning from your back to your side while in a flat bed without using bedrails?: Total Help needed moving from lying on your back to sitting on the side of a flat bed without using bedrails?: Total Help needed moving to and from a bed to a chair (including a wheelchair)?: Total Help needed standing up from a chair using  your arms (e.g., wheelchair or bedside chair)?: Total Help needed to walk in hospital room?: Total Help needed climbing 3-5 steps with a railing? : Total 6 Click Score: 6    End of Session Equipment Utilized During Treatment: Oxygen (vent via trach) Activity Tolerance: Patient tolerated treatment well Patient left: in bed;with SCD's reapplied;with call bell/phone within reach Nurse Communication: Mobility status PT Visit Diagnosis: Hemiplegia and hemiparesis;Other abnormalities of gait and mobility (R26.89) Hemiplegia - Right/Left: Left Hemiplegia - caused by: Cerebral infarction    Time: 5638-7564 PT Time Calculation (min) (ACUTE ONLY): 16 min   Charges:   PT Evaluation $PT Eval Moderate Complexity: 1 Mod          Aida Raider, PT  Office # [phone] Pager [phone]   Ilda Foil 03/12/2022, 11:58 AM

## 2022-03-12 NOTE — Progress Notes (Signed)
eLink Physician-Brief Progress Note Patient Name: Sherry Olson DOB: [birth year] MRN: [id number]   Date of Service  03/12/2022  HPI/Events of Note  Notified of abnormal labs -  K 3.3, Mg 1.6, crea 0.58.    eICU Interventions  Replete K - Kcl via tube. Replete Mg - 2gIV MgSO4.      Intervention Category Intermediate Interventions: Electrolyte abnormality - evaluation and management  Larinda Buttery 03/12/2022, 4:39 AM

## 2022-03-12 NOTE — Progress Notes (Signed)
Progress Note  Patient Name: Sherry Olson Date of Encounter: 03/12/2022  Primary Cardiologist: Gasper Sells  Subjective   Lethargic s/p trach  Inpatient Medications    Scheduled Meds:  alteplase  2 mg Intracatheter Once   atorvastatin  10 mg Per Tube Daily   Chlorhexidine Gluconate Cloth  6 each Topical Daily   mouth rinse  15 mL Mouth Rinse Q2H   pantoprazole (PROTONIX) IV  40 mg Intravenous QHS   sodium chloride flush  10-40 mL Intracatheter Q12H   Continuous Infusions:  sodium chloride 250 mL (03/11/22 1343)   amiodarone 30 mg/hr (03/12/22 0800)   amiodarone     dexmedetomidine (PRECEDEX) IV infusion 1 mcg/kg/hr (03/12/22 0800)   feeding supplement (VITAL 1.5 CAL) 40 mL/hr at 03/12/22 0800   heparin 1,000 Units/hr (03/12/22 0800)   lactated ringers 100 mL/hr at 03/12/22 0800   phenylephrine (NEO-SYNEPHRINE) Adult infusion 35 mcg/min (03/12/22 0800)   PRN Meds: acetaminophen **OR** acetaminophen (TYLENOL) oral liquid 160 mg/5 mL **OR** acetaminophen, amiodarone, fentaNYL (SUBLIMAZE) injection, LORazepam, LORazepam, mouth rinse, polyethylene glycol, senna-docusate, sodium chloride flush   Vital Signs    Vitals:   03/12/22 0745 03/12/22 0755 03/12/22 0800 03/12/22 0815  BP: (!) 126/47  (!) 123/46 (!) 133/46  Pulse: 70 69 67 77  Resp: 20 20 20  (!) 26  Temp:   99.4 F (37.4 C)   TempSrc:   Axillary   SpO2: 100% 100% 100% 100%  Weight:      Height:        Intake/Output Summary (Last 24 hours) at 03/12/2022 0953 Last data filed at 03/12/2022 0800 Gross per 24 hour  Intake 4149.14 ml  Output 503 ml  Net 3646.14 ml   Filed Weights   03/10/22 0500 03/11/22 0444 03/12/22 0427  Weight: 61.5 kg 61.3 kg 64 kg    Telemetry    NSR 03/12/2022   Physical Exam   Gen: vent with trach  Cardiac: No Rubs or Gallops, systolic and diastolic murmur, RRR  GI: Soft, nontender, non-distended  Integument: Skin feels warm  Labs    Chemistry Recent Labs  Lab  03/24/2022 1500 03/15/2022 1505 03/07/22 0547 03/07/22 2344 03/09/22 0605 03/10/22 0645 03/12/22 0327  NA 140   < > 141   < > 141 139 141  K 4.0   < > 4.3   < > 4.1 3.9 3.3*  CL 101   < > 110   < > 114* 110 106  CO2 25   < > 20*   < > 19* 20* 25  GLUCOSE 108*   < > 107*   < > 110* 153* 141*  BUN 18   < > 14   < > 11 13 14   CREATININE 0.87   < > 0.72   < > 0.58 0.58 0.58  CALCIUM 9.4   < > 7.8*   < > 8.3* 8.1* 8.5*  PROT 6.4*  --  4.4*  --  4.9*  --   --   ALBUMIN 3.5  --  2.5*  --  2.3*  --   --   AST 60*  --  45*  --  29  --   --   ALT 23  --  19  --  16  --   --   ALKPHOS 61  --  44  --  49  --   --   BILITOT 1.1  --  0.5  --  0.4  --   --  GFRNONAA >60   < > >60   < > >60 >60 >60  ANIONGAP 14   < > 11   < > 8 9 10    < > = values in this interval not displayed.     Hematology Recent Labs  Lab 03/09/22 0605 03/10/22 0645 03/12/22 0327  WBC 9.1 11.6* 12.3*  RBC 3.26* 3.43* 3.44*  HGB 9.7* 10.0* 9.9*  HCT 29.4* 29.8* 29.9*  MCV 90.2 86.9 86.9  MCH 29.8 29.2 28.8  MCHC 33.0 33.6 33.1  RDW 13.5 13.8 14.1  PLT 95* 142* 196    Cardiac EnzymesNo results for input(s): "TROPONINI" in the last 168 hours. No results for input(s): "TROPIPOC" in the last 168 hours.   BNPNo results for input(s): "BNP", "PROBNP" in the last 168 hours.   DDimer No results for input(s): "DDIMER" in the last 168 hours.   Radiology    DG CHEST PORT 1 VIEW  Result Date: 03/11/2022 CLINICAL DATA:  [id number] Tracheostomy tube present [facility]) [id number] EXAM: PORTABLE CHEST 1 VIEW COMPARISON:  March 11, 2022 FINDINGS: The cardiomediastinal silhouette is unchanged in contour.Interval tracheostomy. RIGHT upper extremity PICC tip terminating over the superior cavoatrial junction. The enteric tube courses through the chest to the abdomen beyond the field-of-view. Favored smallLEFT pleural effusion, similar in comparison to prior. No pneumothorax. Persistent LEFT-sided airspace opacities. RIGHT lung is clear.  IMPRESSION: 1. Interval tracheostomy. 2. Persistent LEFT-sided airspace opacities and favored small LEFT pleural effusion. Electronically Signed   By: Valentino Saxon M.D.   On: 03/11/2022 15:58   DG CHEST PORT 1 VIEW  Result Date: 03/11/2022 CLINICAL DATA:  [id number] History of ETT [id number] EXAM: PORTABLE CHEST 1 VIEW COMPARISON:  March 09, 2022 March 06, 2022 FINDINGS: Evaluation is limited by patient rotation. The cardiomediastinal silhouette is unchanged in contour.Atherosclerotic calcifications of the aorta. The enteric tube courses through the chest to the abdomen beyond the field-of-view. ETT tip terminates approximately 4.3 cm above the carina. Probable small LEFT pleural effusion. No pneumothorax. Heterogeneous opacification of the LEFT lung the increased since March 06, 2022 with increased confluent opacity of the LEFT lung base. Visualized abdomen is unremarkable. IMPRESSION: 1.  Support apparatus as described above. 2. Worsening airspace opacity of the LEFT lung Electronically Signed   By: Valentino Saxon M.D.   On: 03/11/2022 08:33   [place] EKG SITE RITE  Result Date: 03/11/2022 If Site Rite image not attached, placement could not be confirmed due to current cardiac rhythm.   Cardiac Studies  Cardiac Studies & Procedures       ECHOCARDIOGRAM  ECHOCARDIOGRAM COMPLETE 03/07/2022  Narrative ECHOCARDIOGRAM REPORT    Patient Name:   Sherry Olson [facility] Date of Exam: 03/07/2022 Medical Rec #:  [id number]       Height:       63.0 in Accession #:    [id number]      Weight:       114.2 lb Date of Birth:  02-15-1946        BSA:          1.524 m Patient Age:    76 years        BP:           121/48 mmHg Patient Gender: F               HR:           69 bpm. Exam Location:  Inpatient  Procedure: 2D Echo, Cardiac Doppler and Color Doppler  Indications:  Stroke I63.9  History:        Patient has no prior history of Echocardiogram examinations. CAD, Stroke; Risk  Factors:Hypertension and Dyslipidemia.  Sonographer:    Ronny Flurry Referring Phys: [id number] [place]   1. Left ventricular ejection fraction, by estimation, is 60 to 65%. The left ventricle has normal function. The left ventricle has no regional wall motion abnormalities. Left ventricular diastolic parameters were normal. 2. Right ventricular systolic function is normal. The right ventricular size is normal. 3. Moderate but not quite circumferential effusion with dilated IVC Mild diastolic RV collapse on 2D but no variation in mitral inflow Suggest clinical correltation and repeat TTE in 2 weeks . Moderate pericardial effusion. The pericardial effusion is posterior to the left ventricle, lateral to the left ventricle and anterior to the right ventricle. 4. The mitral valve is abnormal. Mild mitral valve regurgitation. No evidence of mitral stenosis. 5. The aortic valve is tricuspid. There is mild calcification of the aortic valve. There is mild thickening of the aortic valve. Aortic valve regurgitation is mild to moderate. Aortic valve sclerosis is present, with no evidence of aortic valve stenosis. 6. The inferior vena cava is dilated in size with >50% respiratory variability, suggesting right atrial pressure of 8 mmHg.  FINDINGS Left Ventricle: Left ventricular ejection fraction, by estimation, is 60 to 65%. The left ventricle has normal function. The left ventricle has no regional wall motion abnormalities. The left ventricular internal cavity size was normal in size. There is no left ventricular hypertrophy. Left ventricular diastolic parameters were normal.  Right Ventricle: The right ventricular size is normal. No increase in right ventricular wall thickness. Right ventricular systolic function is normal.  Left Atrium: Left atrial size was normal in size.  Right Atrium: Right atrial size was normal in size.  Pericardium: Moderate but not quite  circumferential effusion with dilated IVC Mild diastolic RV collapse on 2D but no variation in mitral inflow Suggest clinical correltation and repeat TTE in 2 weeks. A moderately sized pericardial effusion is present. The pericardial effusion is posterior to the left ventricle, lateral to the left ventricle and anterior to the right ventricle.  Mitral Valve: The mitral valve is abnormal. There is mild thickening of the mitral valve leaflet(s). There is mild calcification of the mitral valve leaflet(s). Mild mitral valve regurgitation. No evidence of mitral valve stenosis.  Tricuspid Valve: The tricuspid valve is normal in structure. Tricuspid valve regurgitation is trivial. No evidence of tricuspid stenosis.  Aortic Valve: The aortic valve is tricuspid. There is mild calcification of the aortic valve. There is mild thickening of the aortic valve. Aortic valve regurgitation is mild to moderate. Aortic regurgitation PHT measures 466 msec. Aortic valve sclerosis is present, with no evidence of aortic valve stenosis. Aortic valve mean gradient measures 4.7 mmHg. Aortic valve peak gradient measures 8.9 mmHg. Aortic valve area, by VTI measures 2.33 cm.  Pulmonic Valve: The pulmonic valve was normal in structure. Pulmonic valve regurgitation is not visualized. No evidence of pulmonic stenosis.  Aorta: The aortic root is normal in size and structure.  Venous: The inferior vena cava is dilated in size with greater than 50% respiratory variability, suggesting right atrial pressure of 8 mmHg.  IAS/Shunts: No atrial level shunt detected by color flow Doppler.   LEFT VENTRICLE PLAX 2D LVIDd:         4.70 cm   Diastology LVIDs:         3.20 cm   LV e' medial:  7.72 cm/s LV PW:         0.90 cm   LV E/e' medial:  10.0 LV IVS:        0.80 cm   LV e' lateral:   9.68 cm/s LVOT diam:     1.90 cm   LV E/e' lateral: 8.0 LV SV:         64 LV SV Index:   42 LVOT Area:     2.84 cm   RIGHT VENTRICLE              IVC RV Basal diam:  3.10 cm     IVC diam: 2.10 cm RV S prime:     13.10 cm/s TAPSE (M-mode): 1.9 cm  LEFT ATRIUM             Index        RIGHT ATRIUM           Index LA diam:        3.70 cm 2.43 cm/m   RA Area:     15.00 cm LA Vol (A2C):   47.6 ml 31.24 ml/m  RA Volume:   37.00 ml  24.28 ml/m LA Vol (A4C):   51.1 ml 33.53 ml/m LA Biplane Vol: 50.2 ml 32.94 ml/m AORTIC VALVE AV Area (Vmax):    2.53 cm AV Area (Vmean):   2.18 cm AV Area (VTI):     2.33 cm AV Vmax:           149.33 cm/s AV Vmean:          98.633 cm/s AV VTI:            0.274 m AV Peak Grad:      8.9 mmHg AV Mean Grad:      4.7 mmHg LVOT Vmax:         133.00 cm/s LVOT Vmean:        75.800 cm/s LVOT VTI:          0.225 m LVOT/AV VTI ratio: 0.82 AI PHT:            466 msec  AORTA Ao Root diam: 3.00 cm Ao Asc diam:  3.10 cm  MITRAL VALVE MV Area (PHT): 4.40 cm    SHUNTS MV Decel Time: 173 msec    Systemic VTI:  0.22 m MR Peak grad: 103.0 mmHg   Systemic Diam: 1.90 cm MR Mean grad: 68.0 mmHg MR Vmax:      507.50 cm/s MR Vmean:     385.0 cm/s MV E velocity: 77.50 cm/s MV A velocity: 60.30 cm/s MV E/A ratio:  1.29  Charlton Haws MD Electronically signed by Charlton Haws MD Signature Date/Time: 03/07/2022/11:47:30 AM    Final   TEE  ECHO TEE 03/09/2022  Narrative TRANSESOPHOGEAL ECHO REPORT    Patient Name:   Sherry Olson [facility] Date of Exam: 03/09/2022 Medical Rec #:  [id number]       Height:       63.0 in Accession #:    [id number]      Weight:       135.4 lb Date of Birth:  [birth year]        BSA:          1.638 m Patient Age:    76 years        BP:           125/68 mmHg Patient Gender: F  HR:           83 bpm. Exam Location:  Inpatient  Procedure: Transesophageal Echo, Cardiac Doppler and Color Doppler  Indications:     Afib  History:         Patient has prior history of Echocardiogram examinations, most recent 03/07/2022. CAD; Risk Factors:Hypertension  and Dyslipidemia.  Sonographer:     Darlina Sicilian RDCS Referring Phys:  [id number] [facility] A Gasper Sells Diagnosing Phys: Rudean Haskell MD  PROCEDURE: After discussion of the risks and benefits of a TEE, an informed consent was obtained from a family member. The patient was intubated. TEE procedure time was 6 minutes. The transesophogeal probe was passed without difficulty through the esophogus of the patient. Imaged were obtained with the patient in a supine position. Sedation performed by performing physician. Patients was under conscious sedation during this procedure. Image quality was good. The patient's vital signs; including heart rate, blood pressure, and oxygen saturation; remained stable throughout the procedure. The patient developed no complications during the procedure. A direct current cardioversion was performed.  IMPRESSIONS   1. Left ventricular ejection fraction, by estimation, is 55 to 60%. The left ventricle has normal function. 2. Right ventricular systolic function is normal. The right ventricular size is normal. 3. No left atrial/left atrial appendage thrombus was detected. 4. Moderate pericardial effusion. There is no evidence of cardiac tamponade. Large pleural effusion in the left lateral region. 5. The mitral valve is grossly normal. At least moderate central mitral valve regurgitation. No evidence of mitral stenosis. R-R variability, limited images in the setting of emerent TEE/DCCV 6. The aortic valve is tricuspid. Aortic valve regurgitation is at least moderate. No holodiastolic flow verseral. R-R variability. No aortic stenosis is present. 7. There is mild (Grade II) plaque involving the descending aorta and ascending aorta.  FINDINGS Left Ventricle: Left ventricular ejection fraction, by estimation, is 55 to 60%. The left ventricle has normal function. The left ventricular internal cavity size was normal in size.  Right Ventricle: The right ventricular  size is normal. No increase in right ventricular wall thickness. Right ventricular systolic function is normal.  Left Atrium: Left atrial size was normal in size. No left atrial/left atrial appendage thrombus was detected.  Right Atrium: Right atrial size was normal in size.  Pericardium: A moderately sized pericardial effusion is present. There is no evidence of cardiac tamponade.  Mitral Valve: The mitral valve is grossly normal. Moderate mitral valve regurgitation. No evidence of mitral valve stenosis.  Tricuspid Valve: The tricuspid valve is normal in structure. Tricuspid valve regurgitation is trivial. No evidence of tricuspid stenosis.  Aortic Valve: The aortic valve is tricuspid. Aortic valve regurgitation is moderate. No aortic stenosis is present.  Pulmonic Valve: The pulmonic valve was not well visualized. Pulmonic valve regurgitation is not visualized.  Aorta: The aortic root, ascending aorta and aortic arch are all structurally normal, with no evidence of dilitation or obstruction. There is mild (Grade II) plaque involving the descending aorta and ascending aorta.  IAS/Shunts: The atrial septum is grossly normal.  Additional Comments: There is a large pleural effusion in the left lateral region. Spectral Doppler performed.  Rudean Haskell MD Electronically signed by Rudean Haskell MD Signature Date/Time: 03/09/2022/6:37:44 PM    Final             Patient Profile     76 y.o. female with Afib and embolic stroke No with tracheostomy   Assessment & Plan     Persistent Atrial Fibrillation -DOAC  non compliance Embolic stroke  - On heparin CT head stable no bleed - Transition to oral DOAC per neurology - NSR this am on telemetry   Anemia,  - chronic disease and on blood thinner  - Stable 29.9 this am   Moderate pericardial effusion with out tamponade Moderate AI, Moderate MR - limited echo in ~ 2 weeks; TEE shows unchanged findings 03/09/22    HLD - continue statin  Jenkins Rouge MD [facility]

## 2022-03-12 NOTE — Progress Notes (Signed)
eLink Physician-Brief Progress Note Patient Name: PHALA SCHRAEDER DOB: Aug 29, 1945 MRN: [id number]   Date of Service  03/12/2022  HPI/Events of Note  Notified of oliguria.  Pt has not voided since foley was removed yesterday morning.  Bladder scan only revealing 189cc of urine.   Pt now with trache, saturating at 100% on 40% FIO2.   eICU Interventions  Give 250cc bolus and increase LR to 100cc/hr.  Repeat renal function in the morning.       Intervention Category Intermediate Interventions: Oliguria - evaluation and management  Larinda Buttery 03/12/2022, 2:23 AM

## 2022-03-12 NOTE — Progress Notes (Signed)
ANTICOAGULATION CONSULT NOTE  Pharmacy Consult:  Heparin Indication: atrial fibrillation  Allergies  Allergen Reactions   Amiodarone Palpitations    "Heart trouble" per patient's daughter and husband.   Colesevelam Nausea And Vomiting   Welchol [Colesevelam Hcl] Diarrhea and Nausea And Vomiting    Patient Measurements: Height: 5\' 3"  (160 cm) Weight: 64 kg (141 lb 1.5 oz) IBW/kg (Calculated) : 52.4 Heparin Dosing Weight: 61 kg  Vital Signs: Temp: 99.2 F (37.3 C) (11/18 2000) Temp Source: Axillary (11/18 2000) BP: 101/43 (11/18 2330) Pulse Rate: 67 (11/18 2330)  Labs: Recent Labs    03/10/22 0645 03/10/22 1046 03/12/22 0327 03/12/22 1255 03/12/22 2310  HGB 10.0*  --  9.9*  --   --   HCT 29.8*  --  29.9*  --   --   PLT 142*  --  196  --   --   HEPARINUNFRC  --    < > 0.24* 0.27* 0.40  CREATININE 0.58  --  0.58  --   --    < > = values in this interval not displayed.     Estimated Creatinine Clearance: 53 mL/min (by C-G formula based on SCr of 0.58 mg/dL).   Assessment: 56 YOF with Afib on Eliquis PTA, last dose unknown.  Patient has an acute stroke with a small SAH s/p revascularization on 11/12.  She has been in Afib RVR and pharmacy consulted to dose IV heparin for cardioversion on 11/15.  D/w neuro-okay to continue heparin drip.  Heparin level sub-therapeutic at 0.27 units/mL.  Patient has bruising but no overt bleeding; no issue with heparin infusion.  11/18 PM update:  Heparin level therapeutic   Goal of Therapy:  Heparin level 0.3-0.5 units/ml Monitor platelets by anticoagulation protocol: Yes   Plan:  Cont heparin 1100 units/hr Heparin level with AM labs  12/18, PharmD, BCPS Clinical Pharmacist Phone: [phone]

## 2022-03-13 DIAGNOSIS — I63511 Cerebral infarction due to unspecified occlusion or stenosis of right middle cerebral artery: Secondary | ICD-10-CM | POA: Diagnosis not present

## 2022-03-13 LAB — BASIC METABOLIC PANEL
Anion gap: 7 (ref 5–15)
BUN: 12 mg/dL (ref 8–23)
CO2: 26 mmol/L (ref 22–32)
Calcium: 8.1 mg/dL — ABNORMAL LOW (ref 8.9–10.3)
Chloride: 107 mmol/L (ref 98–111)
Creatinine, Ser: 0.54 mg/dL (ref 0.44–1.00)
GFR, Estimated: >60 mL/min (ref >60)
Glucose, Bld: 151 mg/dL — ABNORMAL HIGH (ref 70–99)
Potassium: 3.6 mmol/L (ref 3.5–5.1)
Sodium: 140 mmol/L (ref 135–145)

## 2022-03-13 LAB — CBC
HCT: 25.5 % — ABNORMAL LOW (ref 36.0–46.0)
Hemoglobin: 8.5 g/dL — ABNORMAL LOW (ref 12.0–15.0)
MCH: 29.4 pg (ref 26.0–34.0)
MCHC: 33.3 g/dL (ref 30.0–36.0)
MCV: 88.2 fL (ref 80.0–100.0)
Platelets: 199 10*3/uL (ref 150–400)
RBC: 2.89 MIL/uL — ABNORMAL LOW (ref 3.87–5.11)
RDW: 14.1 % (ref 11.5–15.5)
WBC: 11.1 10*3/uL — ABNORMAL HIGH (ref 4.0–10.5)
nRBC: 0 % (ref 0.0–0.2)

## 2022-03-13 LAB — GLUCOSE, CAPILLARY
Glucose-Capillary: 137 mg/dL — ABNORMAL HIGH (ref 70–99)
Glucose-Capillary: 129 mg/dL — ABNORMAL HIGH (ref 70–99)
Glucose-Capillary: 124 mg/dL — ABNORMAL HIGH (ref 70–99)
Glucose-Capillary: 139 mg/dL — ABNORMAL HIGH (ref 70–99)
Glucose-Capillary: 123 mg/dL — ABNORMAL HIGH (ref 70–99)
Glucose-Capillary: 144 mg/dL — ABNORMAL HIGH (ref 70–99)

## 2022-03-13 LAB — MAGNESIUM: Magnesium: 2 mg/dL (ref 1.7–2.4)

## 2022-03-13 LAB — HEPARIN LEVEL (UNFRACTIONATED): Heparin Unfractionated: 0.33 IU/mL (ref 0.30–0.70)

## 2022-03-13 MED ORDER — POTASSIUM CHLORIDE 20 MEQ PO PACK
40.0000 meq | PACK | Freq: Once | ORAL | Status: AC
  Administered 2022-03-13: 40 meq
  Filled 2022-03-13: qty 2

## 2022-03-13 MED ORDER — VITAL 1.5 CAL PO LIQD
1000.0000 mL | ORAL | Status: AC
  Administered 2022-03-13: 1000 mL

## 2022-03-13 MED ORDER — FUROSEMIDE 10 MG/ML IJ SOLN
40.0000 mg | Freq: Four times a day (QID) | INTRAMUSCULAR | Status: AC
  Administered 2022-03-13 (×2): 40 mg via INTRAVENOUS
  Filled 2022-03-13 (×2): qty 4

## 2022-03-13 MED ORDER — BETHANECHOL CHLORIDE 10 MG PO TABS
10.0000 mg | ORAL_TABLET | Freq: Three times a day (TID) | ORAL | Status: AC
  Administered 2022-03-13 – 2022-03-15 (×6): 10 mg
  Filled 2022-03-13 (×9): qty 1

## 2022-03-13 MED ORDER — AMIODARONE HCL 200 MG PO TABS
200.0000 mg | ORAL_TABLET | Freq: Two times a day (BID) | ORAL | Status: DC
  Administered 2022-03-13 – 2022-03-15 (×4): 200 mg
  Filled 2022-03-13 (×5): qty 1

## 2022-03-13 MED ORDER — AMIODARONE HCL 200 MG PO TABS
200.0000 mg | ORAL_TABLET | Freq: Two times a day (BID) | ORAL | Status: DC
  Administered 2022-03-13: 200 mg via ORAL
  Filled 2022-03-13: qty 1

## 2022-03-13 MED ORDER — HEPARIN (PORCINE) 25000 UT/250ML-% IV SOLN
1150.0000 [IU]/h | INTRAVENOUS | Status: AC
  Administered 2022-03-13: 1150 [IU]/h via INTRAVENOUS
  Filled 2022-03-13: qty 250

## 2022-03-13 NOTE — Progress Notes (Signed)
ANTICOAGULATION CONSULT NOTE  Pharmacy Consult:  Heparin Indication: atrial fibrillation  Allergies  Allergen Reactions   Amiodarone Palpitations    "Heart trouble" per patient's daughter and husband.   Colesevelam Nausea And Vomiting   Welchol [Colesevelam Hcl] Diarrhea and Nausea And Vomiting    Patient Measurements: Height: 5\' 3"  (160 cm) Weight: 66 kg (145 lb 8.1 oz) IBW/kg (Calculated) : 52.4 Heparin Dosing Weight: 61 kg  Vital Signs: Temp: 99.6 F (37.6 C) (11/19 0812) Temp Source: Axillary (11/19 0812) BP: 114/44 (11/19 1200) Pulse Rate: 62 (11/19 1200)  Labs: Recent Labs    03/12/22 0327 03/12/22 1255 03/12/22 2310 03/13/22 0454  HGB 9.9*  --   --  8.5*  HCT 29.9*  --   --  25.5*  PLT 196  --   --  199  HEPARINUNFRC 0.24* 0.27* 0.40 0.33  CREATININE 0.58  --   --  0.54     Estimated Creatinine Clearance: 53.7 mL/min (by C-G formula based on SCr of 0.54 mg/dL).   Assessment: 64 YOF with Afib on Eliquis PTA, last dose unknown.  Patient has an acute stroke with a small SAH s/p revascularization on 11/12.  She has been in Afib RVR and pharmacy consulted to dose IV heparin for cardioversion on 11/15.  D/w neuro-okay to continue heparin drip.  Heparin level therapeutic at 0.33 units/mL.  Patient has bruising but no overt bleeding; no issue with heparin infusion.  Goal of Therapy:  Heparin level 0.3-0.5 units/ml Monitor platelets by anticoagulation protocol: Yes   Plan:  Increase heparin gtt to 1150 units/hr - hold at 0600 on 11/20 for procedure Daily heparin level and CBC  John Williamsen D. 12/20, PharmD, BCPS, BCCCP 03/13/2022, 12:07 PM

## 2022-03-13 NOTE — Progress Notes (Signed)
Trauma aware of patient.  Will hold TFs after MN for possible PEG tomorrow.  Full consult to be completed on Monday 11/20.  I see orders for heparin per pharmacy but no actual heparin order.  If this is placed and is a gtt, hold around 0600 to prepare for possible PEG placement.  D/w primary team.  Letha Cape 9:16 AM 03/13/2022

## 2022-03-13 NOTE — Progress Notes (Addendum)
STROKE TEAM PROGRESS NOTE   INTERVAL HISTORY Transition amiodarone to PO- discussed with CCM. Continue heparin for now. Trauma surgery to eval for PEG tomorrow. NPO at midnight. Heparin off 4 hours prior to procedure  Vitals:   03/13/22 0645 03/13/22 0700 03/13/22 0748 03/13/22 0812  BP: (!) 118/43 (!) 123/44    Pulse: 72 70 (!) 59   Resp: 18 (!) 21 (!) 22   Temp:    99.6 F (37.6 C)  TempSrc:    Axillary  SpO2: 98% 98% 97%   Weight:      Height:       CBC:  Recent Labs  Lab 03/12/2022 1500 03/14/2022 1505 03/12/22 0327 03/13/22 0454  WBC 11.1*   < > 12.3* 11.1*  NEUTROABS 8.1*  --   --   --   HGB 14.2   < > 9.9* 8.5*  HCT 42.5   < > 29.9* 25.5*  MCV 85.3   < > 86.9 88.2  PLT 104*   < > 196 199   < > = values in this interval not displayed.    Basic Metabolic Panel:  Recent Labs  Lab 03/09/22 1648 03/10/22 0645 03/11/22 1700 03/12/22 0327 03/13/22 0454  NA  --  139  --  141 140  K  --  3.9  --  3.3* 3.6  CL  --  110  --  106 107  CO2  --  20*  --  25 26  GLUCOSE  --  153*  --  141* 151*  BUN  --  13  --  14 12  CREATININE  --  0.58  --  0.58 0.54  CALCIUM  --  8.1*  --  8.5* 8.1*  MG 1.9 1.8   < > 1.6* 2.0  PHOS 3.1 2.5  --   --   --    < > = values in this interval not displayed.    Lipid Panel:  Recent Labs  Lab 03/07/22 0547 03/12/22 0327  CHOL 114  --   TRIG 125 87  HDL 32*  --   CHOLHDL 3.6  --   VLDL 25  --   LDLCALC 57  --     HgbA1c:  Recent Labs  Lab 03/08/2022 1500  HGBA1C 5.0    Urine Drug Screen: No results for input(s): "LABOPIA", "COCAINSCRNUR", "LABBENZ", "AMPHETMU", "THCU", "LABBARB" in the last 168 hours.  Alcohol Level  Recent Labs  Lab 02/26/2022 1500  ETH <10     IMAGING past 24 hours No results found.  PHYSICAL EXAM  Temp:  [98.8 F (37.1 C)-100 F (37.8 C)] 99.6 F (37.6 C) (11/19 0812) Pulse Rate:  [59-159] 59 (11/19 0748) Resp:  [16-33] 22 (11/19 0748) BP: (83-176)/(41-125) 123/44 (11/19 0700) SpO2:  [86  %-100 %] 97 % (11/19 0748) FiO2 (%):  [40 %] 40 % (11/19 0748) Weight:  [66 kg] 66 kg (11/19 0357)  General - Well nourished, well developed, intubated on precedex.  Ophthalmologic - fundi not visualized due to noncooperation.  Cardiovascular - irregularly irregular heart rate and rhythm  Neuro - On precedex, eyes open, following simple commands on the right but psychomotor slowing. Mouthing words. Eyes in mid position, inconsistently blinks to threat on the right only, doll's eyes present, not tracking, PERRL. Corneal reflex present, gag and cough present. Facial symmetry not able to test due to ET tube.  Tongue protrusion not cooperative. RUE and RLE 3/5 against gravity, LUE flaccid. LLE slight withdraw to  pain. Sensation, coordination not cooperative and gait not tested.   ASSESSMENT/PLAN Sherry Olson is a 76 y.o. female with history of DVT, HTN, HLD, Afibb on Eliquis who presents with sudden onset left-sided weakness, right gaze deviation. Mechanical thrombectomy done 11/12 with TICI2c revascularization. Remained intubated post procedure, MRI done and shows bilateral hemisphere strokes. According the the patients daughter she started feeling poorly on Saturday and likely stopped taking her medicine that day, including her xarelto.   Stroke: right MCA large infarct with scattered areas of infarct in bilateral cerebral and cerebellar with right M1 occlusion s/p IR with TICI3, likely embolic due to afib non compliant with eliquis  Code Stroke CT head- findings concerning for acute infarcts in bilateral frontal lobes, right parietal and occipital lobes and bilateral cerebellum.  CTA head & neck Distal right M1 MCA occlusion.  Post IR CT Hyperdense material in the right temporal and frontal lobe, which appears similar to the post thrombectomy CT and most likely represents contrast staining, although superimposed hemorrhage cannot be excluded. Redemonstrated hypodensity in the bilateral  frontal lobes, right parietal lobe, and right occipital lobe, consistent with infarcts. Previously noted cerebellar hypodensity is less conspicuous on this exam.  MRI  Scattered and confluent areas of restricted diffusion in the bilateral cerebral and cerebellar hemispheres, consistent with acute infarcts, likely embolic.  Small amount of subarachnoid hemorrhage in the right temporoparietal occipital region, bilateral occipital horns and along the tentorium.  MRA  R MCA patent now 2D Echo EF 60-65%  LDL 57 HgbA1c 5.0 VTE prophylaxis - heparin subq Eliquis (apixaban) daily prior to admission, on heparin IV.  Therapy recommendations:  SNF Disposition:  pending  Atrial fibrillation RVR Home meds: Eliquis, sotalol Not compliant with eliquis, at least 2 days out of eliquis RVR overnight, HR up to 180s->150s - cardiology on board On amiodarone and cardizem drip S/p high risk TEE/DCCV 03/09/22 Now in and out of afib but rate controlled On heparin IV- brief on hold for trach procedure  Hypotension Hx of HTN Home meds:  amlodipine, metoprolol succinate, benazepril Unstable- was requiring vasopressors On low dose pressor this am BP goal 100-160 Long-term BP goal normotensive  Respiratory failure Possible CAP Intubated -> Trached 11/17  Vent management per CCM on precedex On rocephin and azithromycin-now stopped  Hyperlipidemia Home meds:  Atorvastatin 10mg , resumed in hospital LDL 57, goal < 70 Continue statin on discharge  Other Stroke Risk Factors Advanced Age >/= 55  Hx of DVT  Other Active Problems Dysphagia, Cortrak in place on TF PEG tomorrow with trauma Hypokalemia- K 3.3 -> 3.6 replaced   Hospital day # 7   Patient seen and examined by NP/APP with MD. MD to update note as needed.   75, DNP, FNP-BC [facility] Pager: [phone]  ATTENDING ATTESTATION:   76 year old with right M1 occlusion status post IR prior history of being on  Eliquis for atrial fibrillation now on IV heparin until procedure completed and then transition back.  She is status post trach.   She attempts to mouth words but unable to produce speech.  Inconsistent blink to threat, pupils equal round and reactive, she can follow commands with some delay.  Still on Precedex, heparin drip and Neo GGT.  CCM attempting to wean from ventilator but placed back on full support due to elevated respiratory rate and agitation.  Trauma surgery consulted for PEG placement tomorrow we will hold heparin 4 hours prior to the procedure.  We will change amiodarone  drip to oral 200 mg twice daily and transition heparin to Eliquis after PEG.  Hemoglobin dropped from 9.9-8.5 we will continue to monitor.   Discussed patient's prognosis and condition at length with her husband over the phone.  He is not able to drive and relies on their daughter Lynelle Smoke.  I attempted to call Sherry Olson but she was not available.  I answered all of her husband's questions and updated him on PEG placement tomorrow.  He thanked me and relayed the information to his daughter.   Appreciate CCM and cardiology assistance.   Dr. Reeves Forth evaluated pt independently, reviewed imaging, chart, labs. Discussed and formulated plan with the Resident/APP. Changes were made to the note where appropriate. Please see APP/resident note above for details.     This patient is critically ill due to large RMCA infarct, s/p IR, afib RVR, respiratory failure and at significant risk of neurological worsening, death form recurrent stroke, heart failure, hemorrhagic conversion, seizure. This patient's care requires constant monitoring of vital signs, hemodynamics, respiratory and cardiac monitoring, review of multiple databases, neurological assessment, discussion with family, other specialists and medical decision making of high complexity. I spent 35 minutes of neurocritical care time in the care of this patient.    Nhia Heaphy,MD      To contact Stroke Continuity provider, please refer to [email or web address] After hours, contact General Neurology

## 2022-03-13 NOTE — Progress Notes (Signed)
Sherry Olson, MRN:  [id number], DOB:  [birth year], LOS: 7 ADMISSION DATE:  03-26-22, CONSULTATION DATE:  03/26/2022 REFERRING MD: Derry Lory - Neuro, CHIEF COMPLAINT:  CVA   History of Present Illness:  76 year old woman who presented to [facility] ED 11/12 via EMS as Code Stroke with reported L-sided weakness and facial droop. LKW 1345 11/12. Intermittent confusion x 2-3 days with some hallucinations. PMHx significant for HTN, HLD, CAD, history of DVT, AF (on Eliquis)  Found to have R MCA M1 segment occlusion, s/p mechanical thrombectomy with revascularization.  Achieved TICI 2C revascularization.  PCCM consulted.  Pertinent Medical History:   Past Medical History:  Diagnosis Date   Coronary artery disease    DVT (deep venous thrombosis) (HCC)    HLD (hyperlipidemia)    Hypertension    Significant Hospital Events: Including procedures, antibiotic start and stop dates in addition to other pertinent events   11/12 Code Stroke CT concerning for acute infarcts in bilateral frontal lobes, right parietal and occipital lobes and bilateral cerebellum. Given involvement of multiple vascular territories, consider embolic etiology. Also, recommend MRI to further characterize. 2. No acute hemorrhage. 11/13 tachycardia overnight. Briefly in AF requiring diltiazem, but converted back to NS. Dilt turned off. Phenylephrine needed for post IR BP goals. MRI 11/13 Scattered and confluent areas of restricted diffusion in the bilateral cerebral and cerebellar hemispheres, consistent with acute infarcts, likely embolic. Small SAH in the right temporoparietal, bilateral occipital horns, and along the tentorium.  Echo 11/13 LVEF 60-65%, no RWMAs, normal RV systolic function, moderate pericardial effusion with dilated IVC, mild diastolic RV collapse, +MR 11/15 Afib with RVR overnight to 140s. TEE completed, EF 55-60%, no LAA thrombus. DCCV x 2 with no conversion to NSR. Dilt gtt initiated. 11/16 Ongoing  Afib, improved rates to 70s-80s with spikes into 120s-130s less frequent. Dilt gtt discontinued. Ongoing amiodarone. Briefly weaned on vent x 2 but became tachypneic. No significant effusion on bedside ultrasound 11/17 Intermittent NSR/Afib with rates 80s-130s. Amiodarone ongoing. Weaned on vent 10/5, but concern patient is still too weak to safely extubate. Tracheostomy placed. PICC to be placed today.   Interim History / Subjective:   HR well controlled, in sinus rhythm On pressure support Mental status improved Oliguria, has required in and out cath 3 times  Objective:  Blood pressure 121/74, pulse 76, temperature 99.7 F (37.6 C), temperature source Axillary, resp. rate (!) 23, height 5\' 3"  (1.6 m), weight 66 kg, SpO2 98 %.    Vent Mode: PRVC FiO2 (%):  [40 %] 40 % Set Rate:  [16 bmp] 16 bmp Vt Set:  [410 mL] 410 mL PEEP:  [5 cmH20] 5 cmH20 Pressure Support:  [10 cmH20] 10 cmH20 Plateau Pressure:  [20 cmH20-21 cmH20] 21 cmH20   Intake/Output Summary (Last 24 hours) at 03/13/2022 0713 Last data filed at 03/13/2022 0600 Gross per 24 hour  Intake 3405.93 ml  Output 1050 ml  Net 2355.93 ml   Filed Weights   03/11/22 0444 03/12/22 0427 03/13/22 0357  Weight: 61.3 kg 64 kg 66 kg   Physical Examination:  General:  In bed on vent HENT: NCAT tracheostomy in place PULM: CTA B, vent supported breathing CV: RRR, no mgr GI: BS+, soft, nontender MSK: normal bulk and tone Neuro: awake on vent, hemiplegic on left   Resolved Hospital Problem List:     Assessment & Plan:   Acute ischemic stroke with MCA occlusion on the right post revascularization SAH Atrial fibrillation with fast ventricular response  in the 170s and 180s Chronic AF on Eliquis Acute hypoxemic respiratory failure: stroke, CAP Aspiration pneumonia left Abnormal CXR> did not see effusion on left 11/16 Hypertension CAD History of DVT (2012) followed by vascular in [place]. AC stopped in 2019. Nutrition   Tracheostomy status Anasarca Urinary retention  Discussion: More awake Volume up Rate controlled today  Plan: Needs PEG Diurese today> lasix x2 Pressure support as long as tolerated, full mechanical vent support VAP prevention Change amiodarone to oral Change anticoagulation to oral after PEG Monitor off antibiotics PT/OT efforts Foley today, start urinary retention meds Heparin again today Monitor hemodynamics   Best Practice (right click and "Reselect all SmartList Selections" daily)   Diet/type: NPO TF DVT prophylaxis: prophylactic heparin  GI prophylaxis: PPI Lines: N/A Foley:  N/A Code Status:  full code Last date of multidisciplinary goals of care discussion [11/17, per Stroke team]  Critical care time: 32 minutes   The patient is critically ill with multiple organ system failure and requires high complexity decision making for assessment and support, frequent evaluation and titration of therapies, advanced monitoring, review of radiographic studies and interpretation of complex data.   Critical Care Time devoted to patient care services, exclusive of separately billable procedures, described in this note is 32 minutes.   Roselie Awkward, MD [place] [facility] Pager: [phone] Cell: [phone] After 7:00 pm call Elink  [phone]

## 2022-03-13 NOTE — Progress Notes (Signed)
Transition of Care [facility]) - CAGE-AID Screening   Patient Details  Name: Sherry Olson MRN: [id number] Date of Birth: 04/27/1945    Hewitt Shorts, RN Trauma Response Nurse Phone Number:  [phone] 03/13/2022, 1:47 PM   CAGE-AID Screening: Substance Abuse Screening unable to be completed due to: : Patient unable to participate (pt is on ventilator)  Have You Ever Felt You Ought to Cut Down on Your Drinking or Drug Use?: No Have People Annoyed You By Critizing Your Drinking Or Drug Use?: No Have You Felt Bad Or Guilty About Your Drinking Or Drug Use?: No Have You Ever Had a Drink or Used Drugs First Thing In The Morning to Steady Your Nerves or to Get Rid of a Hangover?: No CAGE-AID Score: 0

## 2022-03-13 NOTE — Progress Notes (Signed)
Placed back on full support at this time due to high RR in the 40's and agitation

## 2022-03-14 ENCOUNTER — Encounter (HOSPITAL_COMMUNITY): Admission: EM | Disposition: E | Payer: Self-pay | Source: Home / Self Care | Attending: Neurology

## 2022-03-14 ENCOUNTER — Inpatient Hospital Stay (HOSPITAL_COMMUNITY): Payer: Medicare Other

## 2022-03-14 DIAGNOSIS — I63511 Cerebral infarction due to unspecified occlusion or stenosis of right middle cerebral artery: Secondary | ICD-10-CM | POA: Diagnosis not present

## 2022-03-14 LAB — URINALYSIS, ROUTINE W REFLEX MICROSCOPIC
Bilirubin Urine: NEGATIVE
Glucose, UA: NEGATIVE mg/dL
Hgb urine dipstick: NEGATIVE
Ketones, ur: NEGATIVE mg/dL
Nitrite: NEGATIVE
Protein, ur: 30 mg/dL — AB
Specific Gravity, Urine: 1.028 (ref 1.005–1.030)
pH: 5 (ref 5.0–8.0)

## 2022-03-14 LAB — MAGNESIUM
Magnesium: 1.7 mg/dL (ref 1.7–2.4)
Magnesium: 1.7 mg/dL (ref 1.7–2.4)

## 2022-03-14 LAB — BASIC METABOLIC PANEL
Anion gap: 8 (ref 5–15)
Anion gap: 9 (ref 5–15)
BUN: 14 mg/dL (ref 8–23)
BUN: 14 mg/dL (ref 8–23)
CO2: 29 mmol/L (ref 22–32)
CO2: 28 mmol/L (ref 22–32)
Calcium: 8.3 mg/dL — ABNORMAL LOW (ref 8.9–10.3)
Calcium: 8.2 mg/dL — ABNORMAL LOW (ref 8.9–10.3)
Chloride: 103 mmol/L (ref 98–111)
Chloride: 103 mmol/L (ref 98–111)
Creatinine, Ser: 0.59 mg/dL (ref 0.44–1.00)
Creatinine, Ser: 0.57 mg/dL (ref 0.44–1.00)
GFR, Estimated: >60 mL/min (ref >60)
GFR, Estimated: >60 mL/min (ref >60)
Glucose, Bld: 116 mg/dL — ABNORMAL HIGH (ref 70–99)
Glucose, Bld: 178 mg/dL — ABNORMAL HIGH (ref 70–99)
Potassium: 3.7 mmol/L (ref 3.5–5.1)
Potassium: 3.2 mmol/L — ABNORMAL LOW (ref 3.5–5.1)
Sodium: 140 mmol/L (ref 135–145)
Sodium: 140 mmol/L (ref 135–145)

## 2022-03-14 LAB — GLUCOSE, CAPILLARY
Glucose-Capillary: 120 mg/dL — ABNORMAL HIGH (ref 70–99)
Glucose-Capillary: 113 mg/dL — ABNORMAL HIGH (ref 70–99)
Glucose-Capillary: 95 mg/dL (ref 70–99)
Glucose-Capillary: 83 mg/dL (ref 70–99)
Glucose-Capillary: 68 mg/dL — ABNORMAL LOW (ref 70–99)
Glucose-Capillary: 97 mg/dL (ref 70–99)
Glucose-Capillary: 112 mg/dL — ABNORMAL HIGH (ref 70–99)

## 2022-03-14 LAB — CBC
HCT: 25.5 % — ABNORMAL LOW (ref 36.0–46.0)
Hemoglobin: 8.1 g/dL — ABNORMAL LOW (ref 12.0–15.0)
MCH: 28.7 pg (ref 26.0–34.0)
MCHC: 31.8 g/dL (ref 30.0–36.0)
MCV: 90.4 fL (ref 80.0–100.0)
Platelets: 220 10*3/uL (ref 150–400)
RBC: 2.82 MIL/uL — ABNORMAL LOW (ref 3.87–5.11)
RDW: 14.1 % (ref 11.5–15.5)
WBC: 10.6 10*3/uL — ABNORMAL HIGH (ref 4.0–10.5)
nRBC: 0 % (ref 0.0–0.2)

## 2022-03-14 SURGERY — EGD (ESOPHAGOGASTRODUODENOSCOPY)
Anesthesia: Moderate Sedation

## 2022-03-14 MED ORDER — VITAL 1.5 CAL PO LIQD
1000.0000 mL | ORAL | Status: DC
  Administered 2022-03-14: 1000 mL
  Filled 2022-03-14: qty 1000

## 2022-03-14 MED ORDER — DEXTROSE 50 % IV SOLN
12.5000 g | INTRAVENOUS | Status: AC
  Administered 2022-03-14: 12.5 g via INTRAVENOUS
  Filled 2022-03-14: qty 50

## 2022-03-14 MED ORDER — METOPROLOL TARTRATE 5 MG/5ML IV SOLN
5.0000 mg | Freq: Once | INTRAVENOUS | Status: AC
  Administered 2022-03-14: 5 mg via INTRAVENOUS
  Filled 2022-03-14: qty 5

## 2022-03-14 MED ORDER — POTASSIUM CHLORIDE 10 MEQ/50ML IV SOLN
10.0000 meq | INTRAVENOUS | Status: AC
  Administered 2022-03-14 – 2022-03-15 (×4): 10 meq via INTRAVENOUS
  Filled 2022-03-14 (×4): qty 50

## 2022-03-14 MED ORDER — HEPARIN (PORCINE) 25000 UT/250ML-% IV SOLN
1150.0000 [IU]/h | INTRAVENOUS | Status: DC
  Administered 2022-03-14 – 2022-03-15 (×2): 1150 [IU]/h via INTRAVENOUS
  Filled 2022-03-14: qty 250

## 2022-03-14 MED ORDER — POTASSIUM CHLORIDE 20 MEQ PO PACK
40.0000 meq | PACK | Freq: Once | ORAL | Status: AC
  Administered 2022-03-14: 40 meq via ORAL
  Filled 2022-03-14: qty 2

## 2022-03-14 MED ORDER — POTASSIUM CHLORIDE 20 MEQ PO PACK
20.0000 meq | PACK | Freq: Once | ORAL | Status: AC
  Administered 2022-03-14: 20 meq

## 2022-03-14 MED ORDER — FUROSEMIDE 10 MG/ML IJ SOLN
40.0000 mg | Freq: Four times a day (QID) | INTRAMUSCULAR | Status: AC
  Administered 2022-03-14 (×2): 40 mg via INTRAVENOUS
  Filled 2022-03-14 (×2): qty 4

## 2022-03-14 MED ORDER — POTASSIUM CHLORIDE 20 MEQ PO PACK
20.0000 meq | PACK | Freq: Once | ORAL | Status: DC
  Filled 2022-03-14: qty 1

## 2022-03-14 NOTE — Progress Notes (Signed)
SLP Cancellation Note  Patient Details Name: Sherry Olson MRN: [id number] DOB: [birth year]   Cancelled treatment:    Patient not medically ready (remains on vent). Will f/u as able.        Blenda Mounts Laurice April 10, 2022, 8:31 AM

## 2022-03-14 NOTE — Progress Notes (Signed)
Sherry Olson, MRN:  [id number], DOB:  17-Feb-1946, LOS: 8 ADMISSION DATE:  03/05/2022, CONSULTATION DATE:  03/20/2022 REFERRING MD: Lorrin Goodell - Neuro, CHIEF COMPLAINT:  CVA   History of Present Illness:  76 year old woman who presented to [facility] ED 11/12 via EMS as Code Stroke with reported L-sided weakness and facial droop. LKW 1345 11/12. Intermittent confusion x 2-3 days with some hallucinations. PMHx significant for HTN, HLD, CAD, history of DVT, AF (on Eliquis)  Found to have R MCA M1 segment occlusion, s/p mechanical thrombectomy with revascularization.  Achieved TICI 2C revascularization.  PCCM consulted.  Pertinent Medical History:   Past Medical History:  Diagnosis Date   Coronary artery disease    DVT (deep venous thrombosis) (HCC)    HLD (hyperlipidemia)    Hypertension    Significant Hospital Events: Including procedures, antibiotic start and stop dates in addition to other pertinent events   11/12 Code Stroke CT concerning for acute infarcts in bilateral frontal lobes, right parietal and occipital lobes and bilateral cerebellum. Given involvement of multiple vascular territories, consider embolic etiology. Also, recommend MRI to further characterize. 2. No acute hemorrhage. 11/13 tachycardia overnight. Briefly in AF requiring diltiazem, but converted back to NS. Dilt turned off. Phenylephrine needed for post IR BP goals. MRI 11/13 Scattered and confluent areas of restricted diffusion in the bilateral cerebral and cerebellar hemispheres, consistent with acute infarcts, likely embolic. Small SAH in the right temporoparietal, bilateral occipital horns, and along the tentorium.  Echo 11/13 LVEF 60-65%, no RWMAs, normal RV systolic function, moderate pericardial effusion with dilated IVC, mild diastolic RV collapse, +MR 11/15 Afib with RVR overnight to 140s. TEE completed, EF 55-60%, no LAA thrombus. DCCV x 2 with no conversion to NSR. Dilt gtt initiated. 11/16 Ongoing  Afib, improved rates to 70s-80s with spikes into 120s-130s less frequent. Dilt gtt discontinued. Ongoing amiodarone. Briefly weaned on vent x 2 but became tachypneic. No significant effusion on bedside ultrasound 11/17 Intermittent NSR/Afib with rates 80s-130s. Amiodarone ongoing. Weaned on vent 10/5, but concern patient is still too weak to safely extubate. Tracheostomy placed. PICC to be placed today.  11/20  try TC and then PSV as tolerated  Interim History / Subjective:   HR well controlled, in sinus rhythm Responds appropriately to questions  Objective:  Blood pressure (!) 124/45, pulse 64, temperature 97.8 F (36.6 C), temperature source Oral, resp. rate (!) 21, height 5\' 3"  (1.6 m), weight 64.3 kg, SpO2 100 %.    Vent Mode: CPAP;PSV FiO2 (%):  [40 %] 40 % Set Rate:  [16 bmp] 16 bmp Vt Set:  [410 mL] 410 mL PEEP:  [5 cmH20] 5 cmH20 Pressure Support:  [10 cmH20] 10 cmH20 Plateau Pressure:  [17 cmH20-18 cmH20] 18 cmH20   Intake/Output Summary (Last 24 hours) at 03/23/2022 1028 Last data filed at 02/23/2022 0900 Gross per 24 hour  Intake 1500.77 ml  Output 3350 ml  Net -1849.23 ml    Filed Weights   03/12/22 0427 03/13/22 0357 03/08/2022 0439  Weight: 64 kg 66 kg 64.3 kg   Physical Examination:  General:  In bed on vent HENT: NCAT tracheostomy in place PULM: CTA B, vent supported breathing CV: RRR, no mgr GI: BS+, soft, nontender MSK: normal bulk and tone Neuro: awake on vent, hemiplegic on left   Resolved Hospital Problem List:     Assessment & Plan:   Acute ischemic stroke with MCA occlusion on the right post revascularization SAH Atrial fibrillation with fast ventricular response  in the 170s and 180s Chronic AF on Eliquis Acute hypoxemic respiratory failure: stroke, CAP Aspiration pneumonia left Abnormal CXR> did not see effusion on left 11/16 Hypertension CAD History of DVT (2012) followed by vascular in [place]. AC stopped in 2019. Nutrition   Tracheostomy status Anasarca Urinary retention  Discussion: Alert, awake Volume up Rate controlled today  Plan: Needs PEG - plan bedside 11/20 Continue to diurese today> lasix x2 S/p 5 days of CAP/aspiration coverage TCT/Pressure support as long as tolerated - explained in detail to patient, full mechanical vent support at night VAP prevention Continue amiodarone  Change anticoagulation to oral after PEG Monitor off antibiotics PT/OT efforts   Best Practice (right click and "Reselect all SmartList Selections" daily)   Diet/type: NPO TF DVT prophylaxis: prophylactic heparin  GI prophylaxis: PPI Lines: N/A Foley:  N/A Code Status:  full code Last date of multidisciplinary goals of care discussion [11/17, per Stroke team]  Critical care time:    CRITICAL CARE Performed by: Lesia Sago Trevaughn Schear   Total critical care time: 33 minutes  Critical care time was exclusive of separately billable procedures and treating other patients.  Critical care was necessary to treat or prevent imminent or life-threatening deterioration.  Critical care was time spent personally by me on the following activities: development of treatment plan with patient and/or surrogate as well as nursing, discussions with consultants, evaluation of patient's response to treatment, examination of patient, obtaining history from patient or surrogate, ordering and performing treatments and interventions, ordering and review of laboratory studies, ordering and review of radiographic studies, pulse oximetry and re-evaluation of patient's condition.    Karren Burly, MD  PCCM See Amion for contact info After 7:00 pm call Elink  [phone]

## 2022-03-14 NOTE — Progress Notes (Signed)
ANTICOAGULATION CONSULT NOTE  Pharmacy Consult:  Heparin Indication: atrial fibrillation  Allergies  Allergen Reactions   Amiodarone Palpitations    "Heart trouble" per patient's daughter and husband.   Colesevelam Nausea And Vomiting   Welchol [Colesevelam Hcl] Diarrhea and Nausea And Vomiting    Patient Measurements: Height: 5\' 3"  (160 cm) Weight: 64.3 kg (141 lb 12.1 oz) IBW/kg (Calculated) : 52.4 Heparin Dosing Weight: 61 kg  Vital Signs: Temp: 97.9 F (36.6 C) (11/20 1200) Temp Source: Oral (11/20 1200) BP: 118/54 (11/20 1100) Pulse Rate: 68 (11/20 1100)  Labs: Recent Labs    03/12/22 0327 03/12/22 1255 03/12/22 2310 03/13/22 0454 03/08/2022 0428  HGB 9.9*  --   --  8.5* 8.1*  HCT 29.9*  --   --  25.5* 25.5*  PLT 196  --   --  199 220  HEPARINUNFRC 0.24* 0.27* 0.40 0.33  --   CREATININE 0.58  --   --  0.54 0.59     Estimated Creatinine Clearance: 53.2 mL/min (by C-G formula based on SCr of 0.59 mg/dL).   Assessment: 88 YOF with Afib on Eliquis PTA, last dose unknown.  Patient has an acute stroke with a small SAH s/p revascularization on 11/12.  She has been in Afib RVR and pharmacy consulted to dose IV heparin for cardioversion on 11/15.  D/w neuro-okay to continue heparin drip.  Heparin drip paused this morning in anticipation of PEG placement but now resumed per CCM and Neuro with family deferring decision on PEG for now. CBC wnl. No bleed issues reported.  Goal of Therapy:  Heparin level 0.3-0.5 units/ml Monitor platelets by anticoagulation protocol: Yes   Plan:  Resume heparin infusion at previous rate 1150 units/hr with now no plans for procedure today Check 8hr heparin level Monitor daily CBC, s/sx bleeding F/u plans for possible PEG   12/15, PharmD, BCPS Please check AMION for all [facility] contact numbers Clinical Pharmacist 03/22/2022 12:37 PM

## 2022-03-14 NOTE — Progress Notes (Signed)
eLink Physician-Brief Progress Note Patient Name: Sherry Olson DOB: 10/15/1945 MRN: [id number]   Date of Service  Mar 30, 2022  HPI/Events of Note  K 3.2  eICU Interventions  replaced     Intervention Category Intermediate Interventions: Electrolyte abnormality - evaluation and management  Henry Russel, P 03-30-2022, 9:45 PM

## 2022-03-14 NOTE — Evaluation (Signed)
Occupational Therapy Evaluation Patient Details Name: Sherry Olson MRN: [id number] DOB: Mar 18, 1946 Today's Date: 04/04/22   History of Present Illness Pt is a 76 y.o. female who presented to the ED with sudden onset L side weakness, facial droop, and R gaze. 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 Pt underwent thrombectomy11/12 and remained intubated after procedure. Trach placement 11/17. Hospitalization also complicated by a fib with RVR. PMH:  HTN, HLD, CAD, history of DVT, AF (on Eliquis)   Clinical Impression   Sherry Olson was evaluated s/p the above admission list, pt unable to provide PLOF or home set up at this time. Upon evaluation pt had functional deficits due to impaired communication, slowed processing,  R gaze preference, L hemibody sensory-motor deficits, generalized weakness, poor balance and activity tolerance. Overall she requires max A +2-total A for all tasks at bed level. Due to RUE weakness she needed hand over hand to complete grooming task at bed level. Bed transferred to chair position, pt unable to initiate or active core to assist with unsupported long sitting. She will benefit from OT acutely. Recommend d/c to SNF for continued therapies.    Recommendations for follow up therapy are one component of a multi-disciplinary discharge planning process, led by the attending physician.  Recommendations may be updated based on patient status, additional functional criteria and insurance authorization.   Follow Up Recommendations  Skilled nursing-short term rehab (<3 hours/day)     Assistance Recommended at Discharge Frequent or constant Supervision/Assistance  Patient can return home with the following A lot of help with walking and/or transfers;A lot of help with bathing/dressing/bathroom;Two people to help with bathing/dressing/bathroom;Two people to help with walking and/or  transfers;Assistance with cooking/housework;Assistance with feeding;Direct supervision/assist for medications management;Direct supervision/assist for financial management;Assist for transportation;Help with stairs or ramp for entrance    Functional Status Assessment  Patient has had a recent decline in their functional status and demonstrates the ability to make significant improvements in function in a reasonable and predictable amount of time.  Equipment Recommendations  Other (comment) (defer)       Precautions / Restrictions Precautions Precautions: Other (comment) Precaution Comments: vent via trach, cortrak, flexiseal, PICC line Restrictions Weight Bearing Restrictions: No      Mobility Bed Mobility Overal bed mobility: Needs Assistance Bed Mobility: Rolling Rolling: Total assist         General bed mobility comments: attempted pulling to long sit in bed, pt required total A    Transfers                   General transfer comment: deferred for safety          ADL either performed or assessed with clinical judgement   ADL Overall ADL's : Needs assistance/impaired Eating/Feeding: NPO   Grooming: Maximal assistance;Bed level Grooming Details (indicate cue type and reason): hand over hand Upper Body Bathing: Maximal assistance;Bed level   Lower Body Bathing: Total assistance;+2 for physical assistance;+2 for safety/equipment;Bed level   Upper Body Dressing : Maximal assistance;Bed level   Lower Body Dressing: Total assistance;+2 for physical assistance;+2 for safety/equipment;Bed level   Toilet Transfer: Total assistance;+2 for physical assistance;+2 for safety/equipment   Toileting- Clothing Manipulation and Hygiene: Total assistance;+2 for physical assistance;+2 for safety/equipment       Functional mobility during ADLs: Total assistance;+2 for safety/equipment;+2 for physical assistance General ADL Comments: requires increased time and cues to  follow simple commands.  L hemibody flaccid, R gaze     Vision Baseline Vision/History: 1 Wears glasses Vision Assessment?: Vision impaired- to be further tested in functional context Additional Comments: able to track across midline with increased time and cues, unable to sustain L field visual attention     Perception Perception Perception Tested?: No   Praxis Praxis Praxis tested?: Not tested    Pertinent Vitals/Pain Pain Assessment Pain Assessment: Faces Faces Pain Scale: No hurt Pain Intervention(s): Monitored during session     Hand Dominance     Extremity/Trunk Assessment Upper Extremity Assessment Upper Extremity Assessment: RUE deficits/detail;LUE deficits/detail RUE Deficits / Details: limited active movement noted, 2/5 MMT. edematous. not using funcitionally for face washing, required hand over hand RUE Coordination: decreased fine motor;decreased gross motor LUE Deficits / Details: flaccid + edema LUE Sensation: decreased proprioception;decreased light touch LUE Coordination: decreased gross motor;decreased fine motor   Lower Extremity Assessment Lower Extremity Assessment: Defer to PT evaluation       Communication Communication Communication: Receptive difficulties;Expressive difficulties;Tracheostomy   Cognition Arousal/Alertness: Awake/alert Behavior During Therapy: Flat affect Overall Cognitive Status: Difficult to assess               General Comments: follows simple commands with increased time and cues.     General Comments  Vent>trach. increased coughing and buring with upright supported posture. RN present. Ordered prevalon boots            Home Living Family/patient expects to be discharged to:: Private residence Living Arrangements: Spouse/significant other Available Help at Discharge: Family Type of Home: House               Additional Comments: Pt unable to provide history. No family present. Above information taken from  previous admission (Nov 2022).      Prior Functioning/Environment Prior Level of Function : Patient poor historian/Family not available                        OT Problem List: Decreased strength;Decreased range of motion;Decreased activity tolerance;Decreased safety awareness;Decreased knowledge of precautions;Decreased knowledge of use of DME or AE;Cardiopulmonary status limiting activity;Increased edema;Pain;Impaired UE functional use      OT Treatment/Interventions: Self-care/ADL training;Therapeutic exercise;DME and/or AE instruction;Therapeutic activities;Patient/family education;Balance training    OT Goals(Current goals can be found in the care plan section) Acute Rehab OT Goals Patient Stated Goal: unable to state OT Goal Formulation: Patient unable to participate in goal setting Time For Goal Achievement: 03/28/22 Potential to Achieve Goals: Good ADL Goals Pt Will Perform Grooming: with min assist;sitting Pt Will Perform Upper Body Dressing: with min assist;sitting Pt Will Perform Lower Body Dressing: with max assist;sit to/from stand Pt Will Transfer to Toilet: with max assist;stand pivot transfer;bedside commode Additional ADL Goal #1: Pt will indep locate 3 grooming items in her L environment  OT Frequency: Min 2X/week       AM-PAC OT "6 Clicks" Daily Activity     Outcome Measure Help from another person eating meals?: Total Help from another person taking care of personal grooming?: A Lot Help from another person toileting, which includes using toliet, bedpan, or urinal?: Total Help from another person bathing (including washing, rinsing, drying)?: A Lot Help from another person to put on and taking off regular upper body clothing?: A Lot Help from another person to put on and taking off regular lower body clothing?: Total 6 Click Score: 9   End of Session Equipment Utilized During Treatment: Oxygen Nurse Communication: Mobility  status  Activity  Tolerance: Patient tolerated treatment well Patient left: in bed;with call bell/phone within reach  OT Visit Diagnosis: Unsteadiness on feet (R26.81);Other abnormalities of gait and mobility (R26.89);Muscle weakness (generalized) (M62.81)                Time: [id number] OT Time Calculation (min): 17 min Charges:  OT General Charges $OT Visit: 1 Visit OT Evaluation $OT Eval Moderate Complexity: 1 Mod    Anuel Sitter D Causey 03/01/2022, 1:52 PM

## 2022-03-14 NOTE — Progress Notes (Signed)
Nutrition Follow-up  DOCUMENTATION CODES:   Non-severe (moderate) malnutrition in context of chronic illness  INTERVENTION:   Once ready to resume TF  Osmolite 1.5 @ 40 ml/hr 60 ml ProSource TF20 daily  Provides: 1520 kcal, 80 grams protein, and 729 ml free water.    NUTRITION DIAGNOSIS:   Moderate Malnutrition related to chronic illness as evidenced by mild fat depletion, mild muscle depletion. Ongoing.   GOAL:   Patient will meet greater than or equal to 90% of their needs Met with TF at goal.   MONITOR:   Vent status, Diet advancement, Weight trends  REASON FOR ASSESSMENT:   Consult Enteral/tube feeding initiation and management  ASSESSMENT:   76 y.o. female with PMH DVT, HTN, HLD who presented after stroke.  TF off this am for PEG tube placement, planned for 12 pm.  Plan for lasix today, pt with mild - deep pitting edema.   Pt discussed during ICU rounds and with RN.   11/12 admit with acute infarcts in bilateral frontal lobes 11/15 s/p cortrak placement 11/17 s/p trach  Medications reviewed and include: protonix  Precedex Neo-synephrine @ 14 mcg   Labs reviewed: A1C: 5.0 CBG's: 113-144  Diet Order:   Diet Order             Diet NPO time specified  Diet effective midnight                   EDUCATION NEEDS:   Not appropriate for education at this time  Skin:  Skin Assessment: Reviewed RN Assessment  Last BM:  300 ml via rectal tube  Height:   Ht Readings from Last 1 Encounters:  03/10/2022 _0  (1.6 m)    Weight:   Wt Readings from Last 1 Encounters:  03/13/2022 64.3 kg    Ideal Body Weight:  52.3 kg  BMI:  Body mass index is 25.11 kg/m.  Estimated Nutritional Needs:   Kcal:  1400-1600  Protein:  75-90 grams  Fluid:  >1.5 L/day  Lockie Pares., RD, LDN, CNSC See AMiON for contact information

## 2022-03-14 NOTE — Progress Notes (Signed)
eLink Physician-Brief Progress Note Patient Name: Sherry Olson DOB: 1945-05-09 MRN: [id number]   Date of Service  28-Mar-2022  HPI/Events of Note  Afib with RVR.  HR 130-150.  Sys bp 130's.  On oral amiodarone.  eICU Interventions  Lopressor 5 mg IV x 1 and next oral dose of amiodarone now     Intervention Category Major Interventions: Arrhythmia - evaluation and management  Henry Russel, P 03/28/2022, 8:08 PM

## 2022-03-14 NOTE — Consult Note (Signed)
Consult Note  Sherry Olson 11/18/1945  [id number].    Requesting MD: Egbert Garibaldi, MD Chief Complaint/Reason for Consult: Consult for PEG placement HPI:  Patient is a 76 year old female who presented to [facility] 11/12 via EMS as a Code Stroke with left sided weakness and facial droop. Last known time she was at baseline was 1345 11/12. She was found to have R MCA M1 segment occlusion and underwent mechanical thrombectomy with revascularization.  Achieved TICI 2C revascularization. Course complicated by small post-revascularization SAH. S/P tracheostomy 11/17. Overall patient will likely transition to SNF once medically stable. Trauma consulted for PEG placement.   PMH otherwise significant for HTN, HLD, CAD, and Hx of DVT on Eliquis. Reportedly had not taken her Eliquis for 1-2 days due to not feeling well. Prior abdominal surgery includes cholecystectomy and colon resection.   ROS: Review of Systems  Unable to perform ROS: Patient nonverbal    Family History  Problem Relation Age of Onset   Hypertension Father    Deep vein thrombosis Father    Heart attack Father    Breast cancer Neg Hx     Past Medical History:  Diagnosis Date   Coronary artery disease    DVT (deep venous thrombosis) (HCC)    HLD (hyperlipidemia)    Hypertension     Past Surgical History:  Procedure Laterality Date   BREAST BIOPSY Right 1996   negative   CHOLECYSTECTOMY     COLON SURGERY  2012   colon resection   COLONOSCOPY N/A 02/26/2021   Procedure: COLONOSCOPY;  Surgeon: Annamaria Helling, DO;  Location: [facility] ENDOSCOPY;  Service: Gastroenterology;  Laterality: N/A;   IR ANGIO VERTEBRAL SEL VERTEBRAL UNI R MOD SED  03/01/2022   IR CT HEAD LTD  02/23/2022   IR PERCUTANEOUS ART THROMBECTOMY/INFUSION INTRACRANIAL INC DIAG ANGIO  02/26/2022   RADIOLOGY WITH ANESTHESIA N/A 03/15/2022   Procedure: IR WITH ANESTHESIA;  Surgeon: Radiologist, Medication, MD;  Location: [place];  Service:  Radiology;  Laterality: N/A;    Social History:  reports that she has never smoked. She has never used smokeless tobacco. She reports that she does not currently use alcohol. She reports that she does not use drugs.  Allergies:  Allergies  Allergen Reactions   Amiodarone Palpitations    "Heart trouble" per patient's daughter and husband.   Colesevelam Nausea And Vomiting   Welchol [Colesevelam Hcl] Diarrhea and Nausea And Vomiting    Medications Prior to Admission  Medication Sig Dispense Refill   colchicine 0.6 MG tablet Take 0.6 mg by mouth daily.     doxycycline (VIBRA-TABS) 100 MG tablet Take 100 mg by mouth 2 (two) times daily.     ELIQUIS 5 MG TABS tablet Take 5 mg by mouth 2 (two) times daily.     indomethacin (INDOCIN) 25 MG capsule Take 25 mg by mouth 2 (two) times daily.     levofloxacin (LEVAQUIN) 500 MG tablet Take 500 mg by mouth daily.     metoprolol tartrate (LOPRESSOR) 100 MG tablet Take 100 mg by mouth 2 (two) times daily.     montelukast (SINGULAIR) 10 MG tablet Take 10 mg by mouth daily.     sotalol (BETAPACE) 80 MG tablet Take 80 mg by mouth 2 (two) times daily.     traMADol (ULTRAM) 50 MG tablet Take 50 mg by mouth 2 (two) times daily as needed.     amLODipine (NORVASC) 5 MG tablet Take 5 mg by  mouth daily.     [EXPIRED] amoxicillin-clavulanate (AUGMENTIN) 875-125 MG tablet Take 1 tablet by mouth 2 (two) times daily for 7 days. 14 tablet 0   aspirin 81 MG chewable tablet Chew by mouth daily.     atorvastatin (LIPITOR) 10 MG tablet Take 10 mg by mouth daily.     benazepril (LOTENSIN) 20 MG tablet Take 20 mg by mouth daily.     Calcium Carbonate (CALCIUM 600 PO) Take by mouth daily.     cholecalciferol (VITAMIN D) 1000 units tablet Take 1,000 Units by mouth daily.     CITALOPRAM HYDROBROMIDE PO Take by mouth.     dicyclomine (BENTYL) 10 MG capsule Take 10 mg by mouth 4 (four) times daily -  before meals and at bedtime. (Patient not taking: Reported on 02/26/2021)      [EXPIRED] doxycycline (ADOXA) 100 MG tablet Take 1 tablet (100 mg total) by mouth 2 (two) times daily for 7 days. 14 tablet 0   FLUTICASONE PROPIONATE EX Apply topically. (Patient not taking: Reported on 02/26/2021)     furosemide (LASIX) 20 MG tablet Take 20 mg by mouth daily. (Patient not taking: Reported on 02/26/2021)     LORazepam (ATIVAN) 0.5 MG tablet Take 0.5 mg by mouth as needed for anxiety.     METOPROLOL SUCCINATE PO Take by mouth.     omeprazole (PRILOSEC) 40 MG capsule Take 40 mg by mouth daily.     ondansetron (ZOFRAN-ODT) 4 MG disintegrating tablet Take 1 tablet (4 mg total) by mouth every 8 (eight) hours as needed for nausea or vomiting. 30 tablet 0    Blood pressure (!) 119/44, pulse (!) 56, temperature 98.3 F (36.8 C), temperature source Axillary, resp. rate 16, height 5\' 3"  (1.6 m), weight 64.3 kg, SpO2 100 %. Physical Exam:  General: WD, elderly female who is laying in bed in NAD HEENT: head is normocephalic, atraumatic.  Sclera are noninjected.  PERRL.  Ears and nose without any masses or lesions.  Mouth is pink and dry, Cortrak in nare Heart: sinus bradycardia.  Normal s1,s2. No obvious murmurs, gallops, or rubs noted.  Palpable radial and pedal pulses bilaterally Lungs: CTAB, no wheezes, rhonchi, or rales noted.  Respiratory effort nonlabored, trach present Abd: soft, NT, ND, +BS, lower midline surgical scar, stool management system with liquid stool  MS: +1 edema in all extremities Skin: warm and dry with no masses, lesions, or rashes Neuro: left sided weakness, followed simple commands   Results for orders placed or performed during the hospital encounter of 02/25/2022 (from the past 48 hour(s))  Glucose, capillary     Status: Abnormal   Collection Time: 03/12/22 12:07 PM  Result Value Ref Range   Glucose-Capillary 109 (H) 70 - 99 mg/dL    Comment: Glucose reference range applies only to samples taken after fasting for at least 8 hours.  Heparin level  (unfractionated)     Status: Abnormal   Collection Time: 03/12/22 12:55 PM  Result Value Ref Range   Heparin Unfractionated 0.27 (L) 0.30 - 0.70 IU/mL    Comment: (NOTE) The clinical reportable range upper limit is being lowered to >1.10 to align with the FDA approved guidance for the current laboratory assay.  If heparin results are below expected values, and patient dosage has  been confirmed, suggest follow up testing of antithrombin III levels. Performed at [facility], [address], [place], [place]   Glucose, capillary     Status: Abnormal  Collection Time: 03/12/22  3:51 PM  Result Value Ref Range   Glucose-Capillary 122 (H) 70 - 99 mg/dL    Comment: Glucose reference range applies only to samples taken after fasting for at least 8 hours.  Glucose, capillary     Status: Abnormal   Collection Time: 03/12/22  7:48 PM  Result Value Ref Range   Glucose-Capillary 132 (H) 70 - 99 mg/dL    Comment: Glucose reference range applies only to samples taken after fasting for at least 8 hours.  Glucose, capillary     Status: Abnormal   Collection Time: 03/12/22 11:01 PM  Result Value Ref Range   Glucose-Capillary 159 (H) 70 - 99 mg/dL    Comment: Glucose reference range applies only to samples taken after fasting for at least 8 hours.  Heparin level (unfractionated)     Status: None   Collection Time: 03/12/22 11:10 PM  Result Value Ref Range   Heparin Unfractionated 0.40 0.30 - 0.70 IU/mL    Comment: (NOTE) The clinical reportable range upper limit is being lowered to >1.10 to align with the FDA approved guidance for the current laboratory assay.  If heparin results are below expected values, and patient dosage has  been confirmed, suggest follow up testing of antithrombin III levels. Performed at [facility], [place] [address], [place], [place] [address]   Glucose, capillary     Status: Abnormal   Collection Time: 03/13/22  3:26 AM  Result Value Ref  Range   Glucose-Capillary 137 (H) 70 - 99 mg/dL    Comment: Glucose reference range applies only to samples taken after fasting for at least 8 hours.  Magnesium     Status: None   Collection Time: 03/13/22  4:54 AM  Result Value Ref Range   Magnesium 2.0 1.7 - 2.4 mg/dL    Comment: Performed at [facility] [address], [place], [place] [id number]  Basic metabolic panel     Status: Abnormal   Collection Time: 03/13/22  4:54 AM  Result Value Ref Range   Sodium 140 135 - 145 mmol/L   Potassium 3.6 3.5 - 5.1 mmol/L   Chloride 107 98 - 111 mmol/L   CO2 26 22 - 32 mmol/L   Glucose, Bld 151 (H) 70 - 99 mg/dL    Comment: Glucose reference range applies only to samples taken after fasting for at least 8 hours.   BUN 12 8 - 23 mg/dL   Creatinine, Ser 0.54 0.44 - 1.00 mg/dL   Calcium 8.1 (L) 8.9 - 10.3 mg/dL   GFR, Estimated >60 >60 mL/min    Comment: (NOTE) Calculated using the CKD-EPI Creatinine Equation (2021)    Anion gap 7 5 - 15    Comment: Performed at [facility] [address] [place], [place] [address]  CBC     Status: Abnormal   Collection Time: 03/13/22  4:54 AM  Result Value Ref Range   WBC 11.1 (H) 4.0 - 10.5 K/uL   RBC 2.89 (L) 3.87 - 5.11 MIL/uL   Hemoglobin 8.5 (L) 12.0 - 15.0 g/dL   HCT 25.5 (L) 36.0 - 46.0 %   MCV 88.2 80.0 - 100.0 fL   MCH 29.4 26.0 - 34.0 pg   MCHC 33.3 30.0 - 36.0 g/dL   RDW 14.1 11.5 - 15.5 %   Platelets 199 150 - 400 K/uL   nRBC 0.0 0.0 - 0.2 %    Comment: Performed at [facility], [address], [place], [place] [address]  Heparin level (unfractionated)     Status: None   Collection Time: 03/13/22  4:54 AM  Result Value Ref Range   Heparin Unfractionated 0.33 0.30 - 0.70 IU/mL    Comment: (NOTE) The clinical reportable range upper limit is being lowered to >1.10 to align with the FDA approved guidance for the current laboratory assay.  If heparin results are below expected values, and patient dosage has  been  confirmed, suggest follow up testing of antithrombin III levels. Performed at [facility], [place] [address] [place], [place] [address]   Glucose, capillary     Status: Abnormal   Collection Time: 03/13/22  8:06 AM  Result Value Ref Range   Glucose-Capillary 129 (H) 70 - 99 mg/dL    Comment: Glucose reference range applies only to samples taken after fasting for at least 8 hours.  Glucose, capillary     Status: Abnormal   Collection Time: 03/13/22 11:57 AM  Result Value Ref Range   Glucose-Capillary 124 (H) 70 - 99 mg/dL    Comment: Glucose reference range applies only to samples taken after fasting for at least 8 hours.  Glucose, capillary     Status: Abnormal   Collection Time: 03/13/22  3:56 PM  Result Value Ref Range   Glucose-Capillary 139 (H) 70 - 99 mg/dL    Comment: Glucose reference range applies only to samples taken after fasting for at least 8 hours.  Glucose, capillary     Status: Abnormal   Collection Time: 03/13/22  7:53 PM  Result Value Ref Range   Glucose-Capillary 123 (H) 70 - 99 mg/dL    Comment: Glucose reference range applies only to samples taken after fasting for at least 8 hours.  Glucose, capillary     Status: Abnormal   Collection Time: 03/13/22 11:12 PM  Result Value Ref Range   Glucose-Capillary 144 (H) 70 - 99 mg/dL    Comment: Glucose reference range applies only to samples taken after fasting for at least 8 hours.  Glucose, capillary     Status: Abnormal   Collection Time: 03/07/2022  3:43 AM  Result Value Ref Range   Glucose-Capillary 120 (H) 70 - 99 mg/dL    Comment: Glucose reference range applies only to samples taken after fasting for at least 8 hours.  Magnesium     Status: None   Collection Time: 03/13/2022  4:28 AM  Result Value Ref Range   Magnesium 1.7 1.7 - 2.4 mg/dL    Comment: Performed at [facility], [place] [address] [place], [place] [id number]  Basic metabolic panel     Status: Abnormal   Collection Time: 03/21/2022  4:28 AM   Result Value Ref Range   Sodium 140 135 - 145 mmol/L   Potassium 3.7 3.5 - 5.1 mmol/L   Chloride 103 98 - 111 mmol/L   CO2 29 22 - 32 mmol/L   Glucose, Bld 116 (H) 70 - 99 mg/dL    Comment: Glucose reference range applies only to samples taken after fasting for at least 8 hours.   BUN 14 8 - 23 mg/dL   Creatinine, Ser 0.59 0.44 - 1.00 mg/dL   Calcium 8.3 (L) 8.9 - 10.3 mg/dL   GFR, Estimated >60 >60 mL/min    Comment: (NOTE) Calculated using the CKD-EPI Creatinine Equation (2021)    Anion gap 8 5 - 15    Comment: Performed at [facility], [address] [place], [place] [address]  CBC     Status: Abnormal   Collection Time: 03/03/2022  4:28 AM  Result Value Ref Range   WBC 10.6 (H) 4.0 - 10.5 K/uL   RBC 2.82 (L) 3.87 - 5.11 MIL/uL   Hemoglobin 8.1 (L) 12.0 - 15.0 g/dL   HCT 25.5 (L) 36.0 - 46.0 %   MCV 90.4 80.0 - 100.0 fL   MCH 28.7 26.0 - 34.0 pg   MCHC 31.8 30.0 - 36.0 g/dL   RDW 14.1 11.5 - 15.5 %   Platelets 220 150 - 400 K/uL   nRBC 0.0 0.0 - 0.2 %    Comment: Performed at [facility], [place] [address] [place], [place] [address]   No results found.    Assessment/Plan Acute ischemic Stroke with MCA occlusion on the right post revascularization SAH Consult for PEG placement  - plan PEG placement tentatively at 1200 today, heparin and TF have been held - Attempted to call husband, Sherry Olson, this AM at [phone] to discuss procedure and obtain consent. Will attempt again later this AM  FEN: TF held for procedure today VTE: heparin gtt held for procedure today ID: no current abx  - below per CCM -  HTN HLD CAD Hx of DVT on Eliquis A. Fib with RVR Aspiration PNA Acute hypoxemic respiratory failure s/p tracheostomy  Anasarca Urinary retention  I reviewed Consultant Stroke notes, last 24 h vitals and pain scores, last 48 h intake and output, last 24 h labs and trends, last 24 h imaging results, and PCCM notes .  Norm Parcel, [facility] Surgery 03/23/2022, 7:52 AM Please see Amion for pager number during day hours 7:00am-4:30pm

## 2022-03-14 NOTE — Progress Notes (Addendum)
STROKE TEAM PROGRESS NOTE   INTERVAL HISTORY Patient is seen in her room with no family at the bedside.  She has been hemodynamically stable and neurological exam remains stable.  PEG tube will be placed today, and heparin has been held.  Will likely transition to Eliquis tomorrow.  Hope to transition patient to trach collar tomorrow.  Vitals:   18-Mar-2022 0645 03/18/2022 0700 03-18-2022 0800 2022/03/18 0908  BP: (!) 117/46 (!) 119/44 (!) 119/48   Pulse: (!) 56 (!) 56 (!) 58 64  Resp: 16 16 16  (!) 21  Temp:   97.8 F (36.6 C)   TempSrc:   Oral   SpO2: 100% 100% 100% 100%  Weight:      Height:       CBC:  Recent Labs  Lab 03/13/22 0454 Mar 18, 2022 0428  WBC 11.1* 10.6*  HGB 8.5* 8.1*  HCT 25.5* 25.5*  MCV 88.2 90.4  PLT 199 220    Basic Metabolic Panel:  Recent Labs  Lab 03/09/22 1648 03/10/22 0645 03/11/22 1700 03/13/22 0454 March 18, 2022 0428  NA  --  139   < > 140 140  K  --  3.9   < > 3.6 3.7  CL  --  110   < > 107 103  CO2  --  20*   < > 26 29  GLUCOSE  --  153*   < > 151* 116*  BUN  --  13   < > 12 14  CREATININE  --  0.58   < > 0.54 0.59  CALCIUM  --  8.1*   < > 8.1* 8.3*  MG 1.9 1.8   < > 2.0 1.7  PHOS 3.1 2.5  --   --   --    < > = values in this interval not displayed.    Lipid Panel:  Recent Labs  Lab 03/12/22 0327  TRIG 87    HgbA1c:  No results for input(s): "HGBA1C" in the last 168 hours.  Urine Drug Screen: No results for input(s): "LABOPIA", "COCAINSCRNUR", "LABBENZ", "AMPHETMU", "THCU", "LABBARB" in the last 168 hours.  Alcohol Level  No results for input(s): "ETH" in the last 168 hours.   IMAGING past 24 hours DG CHEST PORT 1 VIEW  Result Date: 03-18-2022 CLINICAL DATA:  Leukocytosis EXAM: PORTABLE CHEST 1 VIEW COMPARISON:  Radiograph 03/11/2022 FINDINGS: Unchanged large cardiac silhouette. Tracheostomy tube overlies the trachea. Feeding tube passes below the diaphragm, tip excluded by collimation. Persistent, slightly increased left pleural  effusion and left lung airspace opacities. Right lung is clear. No pneumothorax. No acute osseous abnormality. IMPRESSION: Persistent left pleural effusion and left lung airspace disease. Effusion is slightly increased from prior. Electronically Signed   By: 03/13/2022 M.D.   On: 03-18-2022 08:59    PHYSICAL EXAM  Temp:  [97.8 F (36.6 C)-99.1 F (37.3 C)] 97.8 F (36.6 C) (11/20 0800) Pulse Rate:  [56-88] 64 (11/20 0908) Resp:  [14-29] 21 (11/20 0908) BP: (112-145)/(41-97) 119/48 (11/20 0800) SpO2:  [93 %-100 %] 100 % (11/20 0908) FiO2 (%):  [40 %] 40 % (11/20 0908) Weight:  [64.3 kg] 64.3 kg (11/20 0439)  General - Well nourished, well developed, intubated on precedex.  Ophthalmologic - fundi not visualized due to noncooperation.  Cardiovascular - irregularly irregular heart rate and rhythm  Neuro - On precedex, eyes open, following simple commands on the right but psychomotor slowing. Mouthing words. Eyes in mid position, inconsistently blinks to threat on the right only, not tracking,  PERRL. Cough present. Facial symmetry not able to test due to ET tube.  Tongue protrusion not cooperative. RUE and RLE 3/5 against gravity, LUE flaccid. LLE slight withdraw to pain. Sensation, coordination not cooperative and gait not tested.   ASSESSMENT/PLAN Ms. JOSEFINE TUCKEY is a 76 y.o. female with history of DVT, HTN, HLD, Afibb on Eliquis who presents with sudden onset left-sided weakness, right gaze deviation. Mechanical thrombectomy done 11/12 with TICI2c revascularization. Remained intubated post procedure, MRI done and shows bilateral hemisphere strokes. According the the patients daughter she started feeling poorly on Saturday and likely stopped taking her medicine that day, including her Eliquis.  She has received a tracheostomy on 11/17 and is having a PEG tube placed 11/20.  Stroke: right MCA large infarct with scattered areas of infarct in bilateral cerebral and cerebellar with right M1  occlusion s/p IR with TICI3, likely embolic due to afib non compliant with eliquis  Code Stroke CT head- findings concerning for acute infarcts in bilateral frontal lobes, right parietal and occipital lobes and bilateral cerebellum.  CTA head & neck Distal right M1 MCA occlusion.  Post IR CT Hyperdense material in the right temporal and frontal lobe, which appears similar to the post thrombectomy CT and most likely represents contrast staining, although superimposed hemorrhage cannot be excluded. Redemonstrated hypodensity in the bilateral frontal lobes, right parietal lobe, and right occipital lobe, consistent with infarcts. Previously noted cerebellar hypodensity is less conspicuous on this exam.  MRI  Scattered and confluent areas of restricted diffusion in the bilateral cerebral and cerebellar hemispheres, consistent with acute infarcts, likely embolic.  Small amount of subarachnoid hemorrhage in the right temporoparietal occipital region, bilateral occipital horns and along the tentorium.  MRA  R MCA patent now 2D Echo EF 60-65%  LDL 57 HgbA1c 5.0 VTE prophylaxis - heparin subq Eliquis (apixaban) daily prior to admission, on heparin IV.  Therapy recommendations:  SNF Disposition:  pending  Atrial fibrillation RVR Home meds: Eliquis, sotalol Not compliant with eliquis, at least 2 days out of eliquis RVR overnight, HR up to 180s->150s - cardiology on board On amiodarone and cardizem drip S/p high risk TEE/DCCV 03/09/22 Now in and out of afib but rate controlled On heparin IV- brief on hold for PEG procedure  Hypotension Hx of HTN Home meds:  amlodipine, metoprolol succinate, benazepril Unstable- was requiring vasopressors Still requiring low dose phenylephrine BP goal 100-160 Long-term BP goal normotensive  Respiratory failure Possible CAP Intubated -> Trached 11/17  Vent management per CCM on precedex On rocephin and azithromycin-now stopped  Hyperlipidemia Home meds:   Atorvastatin 10mg , resumed in hospital LDL 57, goal < 70 Continue statin on discharge  Other Stroke Risk Factors Advanced Age >/= 57  Hx of DVT  Other Active Problems Dysphagia, Cortrak in place on TF PEG today with trauma Hypokalemia- K 3.3 -> 3.6 replaced -> 3.7-> stable Leukocytosis 10.6 Will check UA CXR shows persistent left lung opacities  Hospital day # 8   Patient seen and examined by NP/APP with MD. MD to update note as needed.   [place] , MSN, AGACNP-BC [facility] See Amion for schedule and pager information 03/22/2022 12:00 PM   ATTENDING ATTESTATION:   76 year old with right M1 occlusion status post IR prior history of being on Eliquis for atrial fibrillation now on IV heparin until procedure completed and then transition back.  She is status post trach.   No change in exam from yesterday.  She attempts to  mouth words but unable to produce speech.  Inconsistent blink to threat, pupils equal round and reactive, she can follow commands with some delay.  Still on Precedex, heparin drip and Neo GGT. PEG was planned for today at noon however was canceled due to lack of consent from family.  Heparin drip and tube feeds restarted.  Potassium was replaced this morning.  She was given 40 mg of Lasix by CCM.  Cardiology recommends outpatient echo.  Continue amiodarone.  CCM to do trach collar trials possibly today.   Appreciate CCM and cardiology assistance.   Dr. Reeves Forth evaluated pt independently, reviewed imaging, chart, labs. Discussed and formulated plan with the Resident/APP. Changes were made to the note where appropriate. Please see APP/resident note above for details.     This patient is critically ill due to large RMCA infarct, s/p IR, afib RVR, respiratory failure and at significant risk of neurological worsening, death form recurrent stroke, heart failure, hemorrhagic conversion, seizure. This patient's care requires constant monitoring of  vital signs, hemodynamics, respiratory and cardiac monitoring, review of multiple databases, neurological assessment, discussion with family, other specialists and medical decision making of high complexity. I spent 35 minutes of neurocritical care time in the care of this patient.    Himmat Enberg,MD     To contact Stroke Continuity provider, please refer to [email or web address] After hours, contact General Neurology

## 2022-03-14 NOTE — Progress Notes (Signed)
Long discussion with the daughter Babette Relic and her husband in three-way call about patient's prognosis.  I discussed with them that she is gradually improving and able to mouth words and answer yes/no questions.  They are hesitant about PEG due to her quality of life.  I discussed with family that it is reversible.  He has questions about the event which I defer to CCM.  I urged him to come visit her before making a decision.  They planned on coming in today but not able to.  I encouraged him to come either tomorrow or the next day.  For now we will hold off on the PEG.  Discussed with CCM Dr. Judeth Horn and he will also discussed with family.  Approximately 20 minutes spent

## 2022-03-14 NOTE — Progress Notes (Signed)
   Chart reviewed.  Maintaining normal rhythm currently.  Moderate pericardial effusion on recent echocardiogram.  Plan will be to repeat echocardiogram as outpatient.  Donato Schultz, MD

## 2022-03-14 NOTE — Progress Notes (Signed)
CSW following for medical progression.   Tamicka Shimon LCSW, MSW, MHA  

## 2022-03-15 DIAGNOSIS — I63511 Cerebral infarction due to unspecified occlusion or stenosis of right middle cerebral artery: Secondary | ICD-10-CM | POA: Diagnosis not present

## 2022-03-15 DIAGNOSIS — Z66 Do not resuscitate: Secondary | ICD-10-CM

## 2022-03-15 DIAGNOSIS — E440 Moderate protein-calorie malnutrition: Secondary | ICD-10-CM | POA: Diagnosis not present

## 2022-03-15 DIAGNOSIS — Z515 Encounter for palliative care: Secondary | ICD-10-CM | POA: Diagnosis not present

## 2022-03-15 DIAGNOSIS — I4891 Unspecified atrial fibrillation: Secondary | ICD-10-CM | POA: Diagnosis not present

## 2022-03-15 DIAGNOSIS — Z7189 Other specified counseling: Secondary | ICD-10-CM

## 2022-03-15 LAB — GLUCOSE, CAPILLARY
Glucose-Capillary: 105 mg/dL — ABNORMAL HIGH (ref 70–99)
Glucose-Capillary: 99 mg/dL (ref 70–99)
Glucose-Capillary: 116 mg/dL — ABNORMAL HIGH (ref 70–99)
Glucose-Capillary: 118 mg/dL — ABNORMAL HIGH (ref 70–99)

## 2022-03-15 LAB — CBC
HCT: 24.4 % — ABNORMAL LOW (ref 36.0–46.0)
Hemoglobin: 8 g/dL — ABNORMAL LOW (ref 12.0–15.0)
MCH: 28.9 pg (ref 26.0–34.0)
MCHC: 32.8 g/dL (ref 30.0–36.0)
MCV: 88.1 fL (ref 80.0–100.0)
Platelets: 211 10*3/uL (ref 150–400)
RBC: 2.77 MIL/uL — ABNORMAL LOW (ref 3.87–5.11)
RDW: 13.8 % (ref 11.5–15.5)
WBC: 11.9 10*3/uL — ABNORMAL HIGH (ref 4.0–10.5)
nRBC: 0 % (ref 0.0–0.2)

## 2022-03-15 LAB — BASIC METABOLIC PANEL
Anion gap: 9 (ref 5–15)
BUN: 15 mg/dL (ref 8–23)
CO2: 30 mmol/L (ref 22–32)
Calcium: 8.4 mg/dL — ABNORMAL LOW (ref 8.9–10.3)
Chloride: 100 mmol/L (ref 98–111)
Creatinine, Ser: 0.66 mg/dL (ref 0.44–1.00)
GFR, Estimated: >60 mL/min (ref >60)
Glucose, Bld: 111 mg/dL — ABNORMAL HIGH (ref 70–99)
Potassium: 4.2 mmol/L (ref 3.5–5.1)
Sodium: 139 mmol/L (ref 135–145)

## 2022-03-15 LAB — HEPARIN LEVEL (UNFRACTIONATED): Heparin Unfractionated: 0.35 IU/mL (ref 0.30–0.70)

## 2022-03-15 LAB — MAGNESIUM: Magnesium: 1.7 mg/dL (ref 1.7–2.4)

## 2022-03-15 MED ORDER — FUROSEMIDE 10 MG/ML IJ SOLN
40.0000 mg | Freq: Four times a day (QID) | INTRAMUSCULAR | Status: DC
  Administered 2022-03-15: 40 mg via INTRAVENOUS
  Filled 2022-03-15: qty 4

## 2022-03-15 MED ORDER — MORPHINE 100MG IN NS 100ML (1MG/ML) PREMIX INFUSION
0.0000 mg/h | INTRAVENOUS | Status: DC
  Administered 2022-03-15 – 2022-03-16 (×2): 5 mg/h via INTRAVENOUS
  Filled 2022-03-15 (×2): qty 100

## 2022-03-15 MED ORDER — MORPHINE BOLUS VIA INFUSION
5.0000 mg | INTRAVENOUS | Status: DC | PRN
  Administered 2022-03-15: 5 mg via INTRAVENOUS

## 2022-03-15 MED ORDER — GLYCOPYRROLATE 0.2 MG/ML IJ SOLN: 0.2000 mg | INTRAMUSCULAR | Status: DC | PRN

## 2022-03-15 MED ORDER — MAGNESIUM SULFATE 2 GM/50ML IV SOLN
2.0000 g | Freq: Once | INTRAVENOUS | Status: AC
  Administered 2022-03-15: 2 g via INTRAVENOUS
  Filled 2022-03-15: qty 50

## 2022-03-15 MED ORDER — ACETAMINOPHEN 650 MG RE SUPP: 650.0000 mg | Freq: Four times a day (QID) | RECTAL | Status: DC | PRN

## 2022-03-15 MED ORDER — POLYVINYL ALCOHOL 1.4 % OP SOLN: 1.0000 [drp] | Freq: Four times a day (QID) | OPHTHALMIC | Status: DC | PRN

## 2022-03-15 MED ORDER — METOPROLOL TARTRATE 5 MG/5ML IV SOLN
5.0000 mg | Freq: Once | INTRAVENOUS | Status: AC
  Administered 2022-03-15: 5 mg via INTRAVENOUS
  Filled 2022-03-15: qty 5

## 2022-03-15 MED ORDER — SODIUM CHLORIDE 0.9 % IV SOLN: INTRAVENOUS | Status: DC

## 2022-03-15 MED ORDER — GLYCOPYRROLATE 1 MG PO TABS: 1.0000 mg | ORAL_TABLET | ORAL | Status: DC | PRN

## 2022-03-15 MED ORDER — LORAZEPAM 2 MG/ML IJ SOLN: 1.0000 mg | INTRAMUSCULAR | Status: DC | PRN

## 2022-03-15 MED ORDER — GLYCOPYRROLATE 0.2 MG/ML IJ SOLN
0.2000 mg | INTRAMUSCULAR | Status: DC | PRN
  Administered 2022-03-15: 0.2 mg via INTRAVENOUS
  Filled 2022-03-15: qty 1

## 2022-03-15 MED ORDER — OSMOLITE 1.5 CAL PO LIQD
1000.0000 mL | ORAL | Status: DC
  Administered 2022-03-15: 1000 mL

## 2022-03-15 MED ORDER — PROSOURCE TF20 ENFIT COMPATIBL EN LIQD
60.0000 mL | Freq: Every day | ENTERAL | Status: DC
  Administered 2022-03-15: 60 mL
  Filled 2022-03-15: qty 60

## 2022-03-15 MED ORDER — ACETAMINOPHEN 325 MG PO TABS: 650.0000 mg | ORAL_TABLET | Freq: Four times a day (QID) | ORAL | Status: DC | PRN

## 2022-03-15 NOTE — Progress Notes (Signed)
NAMEBEDELIA Olson, MRN:  [id number], DOB:  08-19-1945, LOS: 9 ADMISSION DATE:  03/11/2022, CONSULTATION DATE:  03/24/2022 REFERRING MD: Lorrin Goodell - Neuro, CHIEF COMPLAINT:  CVA   History of Present Illness:  76 year old woman who presented to [facility] ED 11/12 via EMS as Code Stroke with reported L-sided weakness and facial droop. LKW 1345 11/12. Intermittent confusion x 2-3 days with some hallucinations. PMHx significant for HTN, HLD, CAD, history of DVT, AF (on Eliquis)  Found to have R MCA M1 segment occlusion, s/p mechanical thrombectomy with revascularization.  Achieved TICI 2C revascularization.  PCCM consulted.  Pertinent Medical History:   Past Medical History:  Diagnosis Date   Coronary artery disease    DVT (deep venous thrombosis) (HCC)    HLD (hyperlipidemia)    Hypertension    Significant Hospital Events: Including procedures, antibiotic start and stop dates in addition to other pertinent events   11/12 Code Stroke CT concerning for acute infarcts in bilateral frontal lobes, right parietal and occipital lobes and bilateral cerebellum. Given involvement of multiple vascular territories, consider embolic etiology. Also, recommend MRI to further characterize. 2. No acute hemorrhage. 11/13 tachycardia overnight. Briefly in AF requiring diltiazem, but converted back to NS. Dilt turned off. Phenylephrine needed for post IR BP goals. MRI 11/13 Scattered and confluent areas of restricted diffusion in the bilateral cerebral and cerebellar hemispheres, consistent with acute infarcts, likely embolic. Small SAH in the right temporoparietal, bilateral occipital horns, and along the tentorium.  Echo 11/13 LVEF 60-65%, no RWMAs, normal RV systolic function, moderate pericardial effusion with dilated IVC, mild diastolic RV collapse, +MR 11/15 Afib with RVR overnight to 140s. TEE completed, EF 55-60%, no LAA thrombus. DCCV x 2 with no conversion to NSR. Dilt gtt initiated. 11/16 Ongoing  Afib, improved rates to 70s-80s with spikes into 120s-130s less frequent. Dilt gtt discontinued. Ongoing amiodarone. Briefly weaned on vent x 2 but became tachypneic. No significant effusion on bedside ultrasound 11/17 Intermittent NSR/Afib with rates 80s-130s. Amiodarone ongoing. Weaned on vent 10/5, but concern patient is still too weak to safely extubate. Tracheostomy placed. PICC to be placed today.  11/20  try TC and then PSV as tolerated  Interim History / Subjective:   HR well controlled, in sinus rhythm Responds appropriately to questions Palliative care meeting today  Objective:  Blood pressure (!) 125/54, pulse 79, temperature 98.5 F (36.9 C), temperature source Oral, resp. rate (!) 24, height 5\' 3"  (1.6 m), weight 60.4 kg, SpO2 100 %.    Vent Mode: CPAP;PSV FiO2 (%):  [40 %] 40 % Set Rate:  [16 bmp] 16 bmp Vt Set:  [410 mL] 410 mL PEEP:  [5 cmH20] 5 cmH20 Pressure Support:  [10 [id number] cmH20] 12 cmH20 Plateau Pressure:  [21 cmH20-22 cmH20] 21 cmH20   Intake/Output Summary (Last 24 hours) at 03/15/2022 1149 Last data filed at 03/15/2022 0645 Gross per 24 hour  Intake 1195.64 ml  Output 3480 ml  Net -2284.36 ml    Filed Weights   03/13/22 0357 03/11/2022 0439 03/15/22 0449  Weight: 66 kg 64.3 kg 60.4 kg   Physical Examination:  General:  In bed on vent HENT: NCAT tracheostomy in place PULM: CTA B, vent supported breathing CV: RRR, no mgr GI: BS+, soft, nontender MSK: normal bulk and tone Neuro: awake on vent, hemiplegic on left   Resolved Hospital Problem List:     Assessment & Plan:   Acute ischemic stroke with MCA occlusion on the right post revascularization [facility] Atrial  fibrillation with fast ventricular response in the 170s and 180s Chronic AF on Eliquis Acute hypoxemic respiratory failure: stroke, CAP Aspiration pneumonia left Abnormal CXR> did not see effusion on left 11/16 Hypertension CAD History of DVT (2012) followed by vascular in  . AC stopped in 2019. Nutrition  Tracheostomy status Anasarca Urinary retention  Discussion: Alert, awake Volume up Rate controlled today  Plan: Needs PEG - plan bedside 11/20 Continue to diurese today> lasix x2 S/p 5 days of CAP/aspiration coverage TCT/Pressure support as long as tolerated - explained in detail to patient, full mechanical vent support at night VAP prevention Continue amiodarone, heparin Family does not wish to pursue PEG at this time Monitor off antibiotics PT/OT efforts   Best Practice (right click and "Reselect all SmartList Selections" daily)   Diet/type: NPO TF DVT prophylaxis: prophylactic heparin  GI prophylaxis: PPI Lines: N/A Foley:  N/A Code Status:  full code Last date of multidisciplinary goals of care discussion [11/17, per Stroke team]  Critical care time:    CRITICAL CARE Performed by: Lesia Sago Yamira Papa   Total critical care time: 31 minutes  Critical care time was exclusive of separately billable procedures and treating other patients.  Critical care was necessary to treat or prevent imminent or life-threatening deterioration.  Critical care was time spent personally by me on the following activities: development of treatment plan with patient and/or surrogate as well as nursing, discussions with consultants, evaluation of patient's response to treatment, examination of patient, obtaining history from patient or surrogate, ordering and performing treatments and interventions, ordering and review of laboratory studies, ordering and review of radiographic studies, pulse oximetry and re-evaluation of patient's condition.    Karren Burly, MD [place] [facility] See Amion for contact info After 7:00 pm call Elink  [phone]

## 2022-03-15 NOTE — Progress Notes (Signed)
Patient was taken off ventilator & placed on ATC at 1758 per MD order.

## 2022-03-15 NOTE — Consult Note (Cosign Needed Addendum)
Palliative Care Consult Note                                  Date: 03/15/2022   Patient Name: Sherry Olson  DOB: 28-Dec-1945  MRN: [id number]  Age / Sex: 76 y.o., female  PCP: Margaretann Loveless, MD Referring Physician: Stroke, Md, MD  Reason for Consultation: Establishing goals of care  HPI/Patient Profile: 76 y.o. female  with past medical history of atrial fibrillation on Eliquis, DVT, HTN, and HLD who presented to [facility] ED as a code stroke on 03/07/2022 with sudden onset left-sided weakness and right gaze deviation.  CT head with findings concerning for acute infarcts in bilateral frontal lobes, right parietal and occipital lobes, and bilateral cerebellum.  CTA head and neck with distal right M1 MCA occlusion. She underwent thrombectomy with revascularization and remained intubated postprocedure.  Admitted to stroke service.  She was trached 11/17.  Palliative Medicine has been consulted for goals of care.  Past Medical History:  Diagnosis Date   Coronary artery disease    DVT (deep venous thrombosis) (HCC)    HLD (hyperlipidemia)    Hypertension     Subjective:   I have reviewed medical records including progress notes, labs and imaging, received report from the team, and assessed the patient at bedside.   I spoke with patient's daughter [name] by phone to discuss diagnosis, prognosis, GOC, EOL wishes, disposition, and options.  I introduced Palliative Medicine as specialized medical care for people living with serious illness. It focuses on providing relief from the symptoms and stress of a serious illness.   Life review was discussed.  Her mother and her husband Iantha Fallen had been married for many years.  They have 2 daughters; [name] and Angie.  [name] is technically a stepdaughter, but was raised by M[place]adez from a young age.  [name] shares that her parents are very co-dependent.  Her mother has essentially "done everything" for years and  her father has been lost without her.  He has not been present at bedside because he "cannot take seeing her like that".  We reviewed patient's hospital course in detail. I provided education on the natural disease trajectory of a sudden, severe neurological injury such as a large stroke. Discussed that prognostication can be challenging due to the uncertainty predicting neurologic outcome as well as the time required to allow for optimal recovery (weeks to sometimes months).  We reviewed patient's current clinical status. She has a trach and has been weaning on the ventilator. She is awake, aware, and mouthing answers to questions. She remains flaccid on the left side.   Discussed that even though there has been some progress/improvement, patient will require rehab and probably long-term care.  Discussed that it is very unlikely she would return to her previous baseline. [name] feels her mother's overall quality of life will not be acceptable.  She states "she would not want to live like this".  She also states her mother would not want to live in a facility.  The difference between full scope medical intervention and comfort care was considered.  Discussed the concept of a comfort path, which means de-escalating and stopping full scope medical interventions, allowing a natural course to occur. Discussed that the goal is comfort and dignity rather than cure/prolonging life.    [name] requests time to speak with her father and sister. She later calls me back and states the  family wishes to proceed with comfort care today. Discussed what that would look like--no ventilator support, no tube feeds, no labs, and minimizing her medications. [name] verbalizes understanding.  Also discussed the issue of code status.  As consistent with the goal of comfort, I recommended DNR status to keep [place] from harming the patient and her last moments of life, to which [name] agrees.   Questions and concerns addressed. Family  encouraged to call with questions or concerns.      Review of Systems  Unable to perform ROS   Objective:   Primary Diagnoses: Present on Admission:  Acute right MCA stroke (HCC)  Middle cerebral artery embolism, right   Physical Exam Vitals reviewed.  Constitutional:      General: She is not in acute distress.    Appearance: She is ill-appearing.  HENT:     Head:     Comments: Cortrak in place Pulmonary:     Effort: Tachypnea present.     Comments: Trach/vent Neurological:     Mental Status: She is alert.     Comments: Left side weakness     Vital Signs:  BP (!) 125/54   Pulse 79   Temp 98.5 F (36.9 C) (Oral)   Resp (!) 24   Ht 5\' 3"  (1.6 m)   Wt 60.4 kg   SpO2 100%   BMI 23.59 kg/m   Palliative Assessment/Data: PPS 30%     Assessment & Plan:   SUMMARY OF RECOMMENDATIONS   Code status changed to DNR Transition to full comfort measures Agree with morphine drip and trach collar PRN medications are available for symptom management D/C tube feeding, cortrak, labs Minimize medications PMT will follow-up tomorrow  Primary Decision Maker: Daughter/[name] is the point of contact for the family  Symptom Management:  Lorazepam (ATIVAN) prn for anxiety Glycopyrrolate (ROBINUL) for excessive secretions Polyvinyl alcohol (LIQUIFILM TEARS) prn for dry eyes Antiseptic oral rinse (BIOTENE) prn for dry mouth  Prognosis:  < 2 weeks  Discharge Planning:  To Be Determined     Thank you for allowing to participate in the care of Robby [place]   MDM - High   Signed by: Claretta Fraise, NP Palliative Medicine Team  Team Phone # [phone]  For individual providers, please see AMION

## 2022-03-15 NOTE — Progress Notes (Signed)
ANTICOAGULATION CONSULT NOTE  Pharmacy Consult:  Heparin Indication: atrial fibrillation  Allergies  Allergen Reactions   Amiodarone Palpitations    "Heart trouble" per patient's daughter and husband.   Colesevelam Nausea And Vomiting   Welchol [Colesevelam Hcl] Diarrhea and Nausea And Vomiting    Patient Measurements: Height: 5\' 3"  (160 cm) Weight: 60.4 kg (133 lb 2.5 oz) IBW/kg (Calculated) : 52.4 Heparin Dosing Weight: 61 kg  Vital Signs: Temp: 98.5 F (36.9 C) (11/21 0800) Temp Source: Oral (11/21 0800) BP: 125/54 (11/21 0836) Pulse Rate: 79 (11/21 0836)  Labs: Recent Labs    03/12/22 2310 03/13/22 0454 03/13/22 0454 03-Apr-2022 0428 Apr 03, 2022 2033 03/15/22 0156 03/15/22 0420  HGB  --  8.5*   < > 8.1*  --   --  8.0*  HCT  --  25.5*  --  25.5*  --   --  24.4*  PLT  --  199  --  220  --   --  211  HEPARINUNFRC 0.40 0.33  --   --   --  0.35  --   CREATININE  --  0.54   < > 0.59 0.57  --  0.66   < > = values in this interval not displayed.     Estimated Creatinine Clearance: 48.7 mL/min (by C-G formula based on SCr of 0.66 mg/dL).   Assessment: 75 YOF with Afib on Eliquis PTA, last dose unknown.  Patient has an acute stroke with a small SAH s/p revascularization on 11/12.  She has been in Afib RVR and pharmacy consulted to dose IV heparin for cardioversion on 11/15.  D/w neuro-okay to continue heparin drip.  Heparin drip resumed 11/21 with family deferring decision on PEG for now. Heparin level therapeutic this morning at 0.35. Hg with slow trend down this admit to 8. Plt WNL. No bleed issues reported.  Goal of Therapy:  Heparin level 0.3-0.5 units/ml Monitor platelets by anticoagulation protocol: Yes   Plan:  Continue heparin infusion at 1150 units/hr Monitor daily heparin level/CBC, s/sx bleeding F/u plans/timing for possible PEG   12/21, PharmD, BCPS Please check AMION for all [facility] contact numbers Clinical Pharmacist 03/15/2022 10:59  AM

## 2022-03-15 NOTE — Progress Notes (Addendum)
STROKE TEAM PROGRESS NOTE   INTERVAL HISTORY Patient is seen in her room with no family at the bedside.  She has been hemodynamically stable and neurological exam remains stable.  Ventilator and weaning mode, hoping to try trach collar today.  Family declined PEG tube placement yesterday, goals of care conversation with palliative care pending.  Patient does nod yes if she is asked a question but inconsistent answers but degree of comprehension is difficult to assess at this time.  Vitals:   03/15/22 1200 03/15/22 1300 03/15/22 1400 03/15/22 1500  BP: (!) 158/49 (!) 143/56 (!) 131/52 (!) 129/58  Pulse: 97 (!) 110  86  Resp: (!) 24 (!) 27 (!) 21 (!) 21  Temp:      TempSrc:      SpO2: 100% 100%  100%  Weight:      Height:       CBC:  Recent Labs  Lab 03/09/2022 0428 03/15/22 0420  WBC 10.6* 11.9*  HGB 8.1* 8.0*  HCT 25.5* 24.4*  MCV 90.4 88.1  PLT 220 [id number]    Basic Metabolic Panel:  Recent Labs  Lab 03/09/22 1648 03/10/22 0645 03/11/22 1700 02/27/2022 2033 03/15/22 0420  NA  --  139   < > 140 139  K  --  3.9   < > 3.2* 4.2  CL  --  110   < > 103 100  CO2  --  20*   < > 28 30  GLUCOSE  --  153*   < > 178* 111*  BUN  --  13   < > 14 15  CREATININE  --  0.58   < > 0.57 0.66  CALCIUM  --  8.1*   < > 8.2* 8.4*  MG 1.9 1.8   < > 1.7 1.7  PHOS 3.1 2.5  --   --   --    < > = values in this interval not displayed.    Lipid Panel:  Recent Labs  Lab 03/12/22 0327  TRIG 87    HgbA1c:  No results for input(s): "HGBA1C" in the last 168 hours.  Urine Drug Screen: No results for input(s): "LABOPIA", "COCAINSCRNUR", "LABBENZ", "AMPHETMU", "THCU", "LABBARB" in the last 168 hours.  Alcohol Level  No results for input(s): "ETH" in the last 168 hours.   IMAGING past 24 hours No results found.  PHYSICAL EXAM  Temp:  [97.5 F (36.4 C)-100 F (37.8 C)] 98.3 F (36.8 C) (11/21 1157) Pulse Rate:  [63-166] 86 (11/21 1500) Resp:  [14-27] 21 (11/21 1500) BP: (113-171)/(41-93)  129/58 (11/21 1500) SpO2:  [95 %-100 %] 100 % (11/21 1500) FiO2 (%):  [40 %] 40 % (11/21 1136) Weight:  [60.4 kg] 60.4 kg (11/21 0449)  General - Well nourished, well developed, trached on precedex.  Ophthalmologic - fundi not visualized due to noncooperation.  Cardiovascular - irregularly irregular heart rate and rhythm  Neuro - On precedex, eyes open, following simple commands on the right but psychomotor slowing. Mouthing words, able to state name.  Would not yes/no to questions but will get answers incorrect about half the time.  Unclear how much she is comprehending and not able to make decisions.  Eyes in mid position, inconsistently blinks to threat on the right only, not tracking, PERRL. Cough present.  Face symmetrical. Tongue midline. RUE and RLE 3/5 against gravity, no movement of left upper or lower extremities. sensation, coordination not cooperative and gait not tested.   ASSESSMENT/PLAN Sherry Olson  is a 76 y.o. female with history of DVT, HTN, HLD, Afibb on Eliquis who presents with sudden onset left-sided weakness, right gaze deviation. Mechanical thrombectomy done 11/12 with TICI2c revascularization. Remained intubated post procedure, MRI done and shows bilateral hemisphere strokes. According the the patients daughter she started feeling poorly on Saturday and likely stopped taking her medicine that day, including her Eliquis.  She has received a tracheostomy on 11/17.  There was a plan for PEG tube, but patient's family would now like to have a goals of care conversation with palliative care.  Stroke: right MCA large infarct with scattered areas of infarct in bilateral cerebral and cerebellar with right M1 occlusion s/p IR with TICI3, likely embolic due to afib non compliant with eliquis  Code Stroke CT head- findings concerning for acute infarcts in bilateral frontal lobes, right parietal and occipital lobes and bilateral cerebellum.  CTA head & neck Distal right M1 MCA  occlusion.  Post IR CT Hyperdense material in the right temporal and frontal lobe, which appears similar to the post thrombectomy CT and most likely represents contrast staining, although superimposed hemorrhage cannot be excluded. Redemonstrated hypodensity in the bilateral frontal lobes, right parietal lobe, and right occipital lobe, consistent with infarcts. Previously noted cerebellar hypodensity is less conspicuous on this exam.  MRI  Scattered and confluent areas of restricted diffusion in the bilateral cerebral and cerebellar hemispheres, consistent with acute infarcts, likely embolic.  Small amount of subarachnoid hemorrhage in the right temporoparietal occipital region, bilateral occipital horns and along the tentorium.  MRA  R MCA patent now 2D Echo EF 60-65%  LDL 57 HgbA1c 5.0 VTE prophylaxis - heparin subq Eliquis (apixaban) daily prior to admission, on heparin IV.  Therapy recommendations:  SNF Disposition:  pending  Atrial fibrillation RVR Home meds: Eliquis, sotalol Not compliant with eliquis, at least 2 days out of eliquis RVR overnight, HR up to 180s->150s - cardiology on board On p.o. amiodarone S/p high risk TEE/DCCV 03/09/22 Now in and out of afib but rate controlled On heparin IV  Hypotension Hx of HTN Home meds:  amlodipine, metoprolol succinate, benazepril Unstable- was requiring vasopressors Still requiring low dose phenylephrine BP goal 100-160 Long-term BP goal normotensive  Respiratory failure Possible CAP Intubated -> Trached 11/17  Vent management per CCM on precedex On rocephin and azithromycin-now stopped  Hyperlipidemia Home meds:  Atorvastatin 10mg , resumed in hospital LDL 57, goal < 70 Continue statin on discharge  Other Stroke Risk Factors Advanced Age >/= 82  Hx of DVT  Other Active Problems Dysphagia, Cortrak in place on TF Plan for PEG tube on hold Hypokalemia- K 3.3 -> 3.6 replaced -> 3.7-> stable Leukocytosis 10.6-> 11.9 Will  check UA CXR shows persistent left lung opacities  Hospital day # 9   Patient seen and examined by NP/APP with MD. MD to update note as needed.   Jarratt , MSN, AGACNP-BC [facility] See Amion for schedule and pager information 03/15/2022 3:43 PM  ATTENDING ATTESTATION:   76 year old with right M1 occlusion status post IR.  She is status post trach her family decision.  As she has not improved significantly family is concerned that she would not want to live like this I discussed with him yesterday and palliative care had an extensive discussion with him today including the husband and both daughters all in agreement with transition to comfort care.  Given her prognosis is poor for recovery transition to comfort care is reasonable.  I discussed  with with Dr. Judeth Horn ICU physician as well as palliative care and family and all are in agreement with this transition.  Family understands this may take weeks for her to pass or she may pass more immediately after taking off the ventilator.  Addendum: taken off ventilator.   Dr. Viviann Spare evaluated pt independently, reviewed imaging, chart, labs. Discussed and formulated plan with the Resident/APP. Changes were made to the note where appropriate. Please see APP/resident note above for details.     This patient is critically ill due to large RMCA infarct, s/p IR, afib RVR, respiratory failure and at significant risk of neurological worsening, death form recurrent stroke, heart failure, hemorrhagic conversion, seizure. This patient's care requires constant monitoring of vital signs, hemodynamics, respiratory and cardiac monitoring, review of multiple databases, neurological assessment, discussion with family, other specialists and medical decision making of high complexity. I spent 50 minutes of neurocritical care time in the care of this patient.    Benzion Mesta,MD     To contact Stroke Continuity provider, please refer to  [email or web address]. After hours, contact General Neurology

## 2022-03-15 NOTE — Progress Notes (Signed)
RT NOTE: patient placed on CPAP/PSV of 10/5 at 0835.  Currently tolerating well.  Will continue to monitor and wean as tolerated.  Will continue to monitor.

## 2022-03-15 NOTE — Progress Notes (Signed)
[facility] ADULT ICU REPLACEMENT PROTOCOL   The patient does apply for the [facility] Adult ICU Electrolyte Replacment Protocol based on the criteria listed below:   1.Exclusion criteria: TCTS, ECMO, Dialysis, and Myasthenia Gravis patients 2. Is GFR >/= 30 ml/min? Yes.    Patient's GFR today is >60 3. Is SCr </= 2? Yes.   Patient's SCr is 0.66 mg/dL 4. Did SCr increase >/= 0.5 in 24 hours? No. 5.Pt's weight >40kg  Yes.   6. Abnormal electrolyte(s): Mag 1.7  7. Electrolytes replaced per protocol 8.  Call MD STAT for K+ </= 2.5, Phos </= 1, or Mag </= 1 Physician:  Dr Mercer Pod 03/15/2022 5:50 AM

## 2022-03-15 NOTE — Progress Notes (Signed)
Physical Therapy Treatment Patient Details Name: Sherry Olson MRN: [id number] DOB: May 23, 1945 Today's Date: 03/15/2022   History of Present Illness Pt is a 76 y.o. female who presented to the ED with sudden onset L side weakness, facial droop, and R gaze. 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 Pt underwent thrombectomy11/12 and remained intubated after procedure. Trach placement 11/17. Hospitalization also complicated by a fib with RVR. PMH:  HTN, HLD, CAD, history of DVT, AF (on Eliquis)    PT Comments    Patient progressing up to EOB today with max A.  Seated for little more than 5 minutes with mod A for balance and attempting to bring hands to midline and for L side awareness and visual tracking though none noted past midline.  She tolerated well, though did nod her head in response to fatigue.  She remains appropriate for SNF level rehab at d/c.    Recommendations for follow up therapy are one component of a multi-disciplinary discharge planning process, led by the attending physician.  Recommendations may be updated based on patient status, additional functional criteria and insurance authorization.  Follow Up Recommendations  Skilled nursing-short term rehab (<3 hours/day) Can patient physically be transported by private vehicle: No   Assistance Recommended at Discharge Frequent or constant Supervision/Assistance  Patient can return home with the following Two people to help with walking and/or transfers;Two people to help with bathing/dressing/bathroom;Assist for transportation   Equipment Recommendations  Other (comment) (TBA)    Recommendations for Other Services       Precautions / Restrictions Precautions Precautions: Fall Precaution Comments: vent via trach, cortrak, flexiseal, PICC line     Mobility  Bed Mobility Overal bed mobility: Needs Assistance Bed Mobility: Supine to Sit,  Sit to Supine     Supine to sit: Max assist, HOB elevated Sit to supine: Max assist   General bed mobility comments: assist to scoot to EOB and lift trunk and move L LE off bed; to supine with assist for leg and positioning    Transfers                   General transfer comment: NT    Ambulation/Gait                   Stairs             Wheelchair Mobility    Modified Rankin (Stroke Patients Only) Modified Rankin (Stroke Patients Only) Pre-Morbid Rankin Score: Slight disability Modified Rankin: Severe disability     Balance Overall balance assessment: Needs assistance Sitting-balance support: Feet unsupported Sitting balance-Leahy Scale: Poor Sitting balance - Comments: mod A for balance with pt leaning back and to R; able to sit EOB about 8 minutes with support and working on L attention with hands placed together in her lap and attempts at visual attention to L without passing midline.                                    Cognition Arousal/Alertness: Awake/alert Behavior During Therapy: Flat affect Overall Cognitive Status: Difficult to assess                                 General Comments: nods head at times to questions, intermittent command following  Exercises Other Exercises Other Exercises: PROM L UE in supine and positioning for edema management    General Comments General comments (skin integrity, edema, etc.): on vent via trach PRVC mode, 40% FiO2, PEEP of 5 with VSS, HR 110 max;      Pertinent Vitals/Pain Pain Assessment Pain Assessment: Faces Faces Pain Scale: Hurts little more Pain Location: generalized with mobility Pain Descriptors / Indicators: Grimacing Pain Intervention(s): Monitored during session, Repositioned, Limited activity within patient's tolerance    Home Living                          Prior Function            PT Goals (current goals can now be found  in the care plan section) Progress towards PT goals: Progressing toward goals    Frequency    Min 3X/week      PT Plan Current plan remains appropriate    Co-evaluation              AM-PAC PT "6 Clicks" Mobility   Outcome Measure  Help needed turning from your back to your side while in a flat bed without using bedrails?: Total Help needed moving from lying on your back to sitting on the side of a flat bed without using bedrails?: Total Help needed moving to and from a bed to a chair (including a wheelchair)?: Total Help needed standing up from a chair using your arms (e.g., wheelchair or bedside chair)?: Total Help needed to walk in hospital room?: Total Help needed climbing 3-5 steps with a railing? : Total 6 Click Score: 6    End of Session   Activity Tolerance: Patient limited by fatigue Patient left: in bed;with SCD's reapplied   PT Visit Diagnosis: Hemiplegia and hemiparesis;Other abnormalities of gait and mobility (R26.89) Hemiplegia - Right/Left: Left Hemiplegia - dominant/non-dominant: Non-dominant Hemiplegia - caused by: Cerebral infarction     Time: 1600-1630 PT Time Calculation (min) (ACUTE ONLY): 30 min  Charges:  $Therapeutic Activity: 23-37 mins                     Sheran Lawless, PT Acute Rehabilitation Services Office:[phone] 03/15/2022    Elray Mcgregor 03/15/2022, 5:47 PM

## 2022-03-16 DIAGNOSIS — Z515 Encounter for palliative care: Secondary | ICD-10-CM | POA: Diagnosis not present

## 2022-03-16 DIAGNOSIS — I4891 Unspecified atrial fibrillation: Secondary | ICD-10-CM | POA: Diagnosis not present

## 2022-03-16 DIAGNOSIS — I63511 Cerebral infarction due to unspecified occlusion or stenosis of right middle cerebral artery: Secondary | ICD-10-CM | POA: Diagnosis not present

## 2022-03-16 DIAGNOSIS — J189 Pneumonia, unspecified organism: Secondary | ICD-10-CM | POA: Diagnosis not present

## 2022-03-25 NOTE — Progress Notes (Signed)
SLP Cancellation Note  Patient Details Name: Sherry Olson MRN: [id number] DOB: 1945/07/16   Cancelled treatment:       Reason Eval/Treat Not Completed: SLP screened, no needs identified, will sign off (Pt has been transitioned to full comfort measures and SLP orders have therefore been cancelled.)  Joely Losier I. Vear Clock, MS, CCC-SLP Acute Rehabilitation Services Office number [phone]  Scheryl Marten 04/04/22, 8:34 AM

## 2022-03-25 NOTE — Death Summary Note (Signed)
DEATH SUMMARY   Patient Details  Name: Sherry Olson MRN: [id number] DOB: February 15, 1946  Admission/Discharge Information   Admit Date:  03/08/2022  Date of Death: Date of Death: 2022/03/18  Time of Death: Time of Death: 20  Length of Stay: 2022-08-05  Referring Physician: Perrin Maltese, MD   Reason(s) for Hospitalization  Stroke  Diagnoses  Preliminary cause of death:  Secondary Diagnoses (including complications and co-morbidities):  Principal Problem:   Acute right MCA stroke [facility]) Active Problems:   Middle cerebral artery embolism, right   Atrial fibrillation with RVR ([place])   Community acquired pneumonia of right upper lobe of lung   Malnutrition of moderate degree   Brief Hospital Course (including significant findings, care, treatment, and services provided and events leading to death)  Sherry Olson is a 76 y.o. year old female who 76 y.o. female with history of DVT, HTN, HLD, Afibb on Eliquis who presents with sudden onset left-sided weakness, right gaze deviation. Mechanical thrombectomy done 2023/03/09 with TICI2c revascularization. Remained intubated post procedure, MRI done and shows bilateral hemisphere strokes. According the the patients daughter she started feeling poorly on Saturday and likely stopped taking her medicine that day, including her Eliquis.  She has received a tracheostomy on 11/17.  There was a plan for PEG tube, but patient's family opted for comfort care. Palliative care consulted.  Multiple discussions with family  including husband and both daughters confirmed their decision. Discussed with CCM.  Patient passed next day after transitioning to comfort care.     Pertinent Labs and Studies  Significant Diagnostic Studies DG CHEST PORT 1 VIEW  Result Date: 03/17/2022 CLINICAL DATA:  Leukocytosis EXAM: PORTABLE CHEST 1 VIEW COMPARISON:  Radiograph 03/11/2022 FINDINGS: Unchanged large cardiac silhouette. Tracheostomy tube overlies the trachea. Feeding tube  passes below the diaphragm, tip excluded by collimation. Persistent, slightly increased left pleural effusion and left lung airspace opacities. Right lung is clear. No pneumothorax. No acute osseous abnormality. IMPRESSION: Persistent left pleural effusion and left lung airspace disease. Effusion is slightly increased from prior. Electronically Signed   By: Maurine Simmering M.D.   On: 03/23/2022 08:59   DG CHEST PORT 1 VIEW  Result Date: 03/11/2022 CLINICAL DATA:  [id number] Tracheostomy tube present [facility]) [id number] EXAM: PORTABLE CHEST 1 VIEW COMPARISON:  March 11, 2022 FINDINGS: The cardiomediastinal silhouette is unchanged in contour.Interval tracheostomy. RIGHT upper extremity PICC tip terminating over the superior cavoatrial junction. The enteric tube courses through the chest to the abdomen beyond the field-of-view. Favored smallLEFT pleural effusion, similar in comparison to prior. No pneumothorax. Persistent LEFT-sided airspace opacities. RIGHT lung is clear. IMPRESSION: 1. Interval tracheostomy. 2. Persistent LEFT-sided airspace opacities and favored small LEFT pleural effusion. Electronically Signed   By: Valentino Saxon M.D.   On: 03/11/2022 15:58   DG CHEST PORT 1 VIEW  Result Date: 03/11/2022 CLINICAL DATA:  [id number] History of ETT [id number] EXAM: PORTABLE CHEST 1 VIEW COMPARISON:  March 09, 2022 08-Mar-2022 FINDINGS: Evaluation is limited by patient rotation. The cardiomediastinal silhouette is unchanged in contour.Atherosclerotic calcifications of the aorta. The enteric tube courses through the chest to the abdomen beyond the field-of-view. ETT tip terminates approximately 4.3 cm above the carina. Probable small LEFT pleural effusion. No pneumothorax. Heterogeneous opacification of the LEFT lung the increased since 2022/03/08 with increased confluent opacity of the LEFT lung base. Visualized abdomen is unremarkable. IMPRESSION: 1.  Support apparatus as described above. 2. Worsening  airspace opacity of the LEFT lung Electronically  Signed   By: Valentino Saxon M.D.   On: 03/11/2022 08:33   [place] EKG SITE RITE  Result Date: 03/11/2022 If Site Rite image not attached, placement could not be confirmed due to current cardiac rhythm.  DG CHEST PORT 1 VIEW  Result Date: 03/09/2022 CLINICAL DATA:  Intubated EXAM: PORTABLE CHEST 1 VIEW COMPARISON:  03/15/2022 FINDINGS: Single frontal view of the chest demonstrates endotracheal tube overlying tracheal air column tip at thoracic inlet. Enteric catheter passes below diaphragm tip excluded by collimation. The cardiac silhouette is enlarged but stable. There is progressive left-sided airspace disease consistent with pneumonia. Small left effusion at the left lung base. Right chest is clear. No pneumothorax. IMPRESSION: 1. Progressive left-sided bronchopneumonia with small left parapneumonic effusion. 2. Support devices as above. Electronically Signed   By: Randa Ngo M.D.   On: 03/09/2022 21:22   ECHO TEE  Result Date: 03/09/2022    TRANSESOPHOGEAL ECHO REPORT   Patient Name:   Sherry Olson [facility] Date of Exam: 03/09/2022 Medical Rec #:  [id number]       Height:       63.0 in Accession #:    [id number]      Weight:       135.4 lb Date of Birth:  [birth year]        BSA:          1.638 m Patient Age:    76 years        BP:           125/68 mmHg Patient Gender: F               HR:           83 bpm. Exam Location:  Inpatient Procedure: Transesophageal Echo, Cardiac Doppler and Color Doppler Indications:     Afib  History:         Patient has prior history of Echocardiogram examinations, most                  recent 03/07/2022. CAD; Risk Factors:Hypertension and                  Dyslipidemia.  Sonographer:     Darlina Sicilian RDCS Referring Phys:  [id number] [facility] A Gasper Sells Diagnosing Phys: Rudean Haskell MD PROCEDURE: After discussion of the risks and benefits of a TEE, an informed consent was obtained from a family member. The patient was  intubated. TEE procedure time was 6 minutes. The transesophogeal probe was passed without difficulty through the esophogus of the patient. Imaged were obtained with the patient in a supine position. Sedation performed by performing physician. Patients was under conscious sedation during this procedure. Image quality was good. The patient's vital signs; including heart rate, blood pressure, and oxygen saturation; remained stable throughout the procedure. The patient developed no complications during the procedure. A direct current cardioversion was performed.  IMPRESSIONS  1. Left ventricular ejection fraction, by estimation, is 55 to 60%. The left ventricle has normal function.  2. Right ventricular systolic function is normal. The right ventricular size is normal.  3. No left atrial/left atrial appendage thrombus was detected.  4. Moderate pericardial effusion. There is no evidence of cardiac tamponade. Large pleural effusion in the left lateral region.  5. The mitral valve is grossly normal. At least moderate central mitral valve regurgitation. No evidence of mitral stenosis. R-R variability, limited images in the setting of emerent TEE/DCCV  6. The aortic valve is tricuspid. Aortic valve regurgitation is  at least moderate. No holodiastolic flow verseral. R-R variability. No aortic stenosis is present.  7. There is mild (Grade II) plaque involving the descending aorta and ascending aorta. FINDINGS  Left Ventricle: Left ventricular ejection fraction, by estimation, is 55 to 60%. The left ventricle has normal function. The left ventricular internal cavity size was normal in size. Right Ventricle: The right ventricular size is normal. No increase in right ventricular wall thickness. Right ventricular systolic function is normal. Left Atrium: Left atrial size was normal in size. No left atrial/left atrial appendage thrombus was detected. Right Atrium: Right atrial size was normal in size. Pericardium: A moderately  sized pericardial effusion is present. There is no evidence of cardiac tamponade. Mitral Valve: The mitral valve is grossly normal. Moderate mitral valve regurgitation. No evidence of mitral valve stenosis. Tricuspid Valve: The tricuspid valve is normal in structure. Tricuspid valve regurgitation is trivial. No evidence of tricuspid stenosis. Aortic Valve: The aortic valve is tricuspid. Aortic valve regurgitation is moderate. No aortic stenosis is present. Pulmonic Valve: The pulmonic valve was not well visualized. Pulmonic valve regurgitation is not visualized. Aorta: The aortic root, ascending aorta and aortic arch are all structurally normal, with no evidence of dilitation or obstruction. There is mild (Grade II) plaque involving the descending aorta and ascending aorta. IAS/Shunts: The atrial septum is grossly normal. Additional Comments: There is a large pleural effusion in the left lateral region. Spectral Doppler performed. Riley Lam MD Electronically signed by Riley Lam MD Signature Date/Time: 03/09/2022/6:37:44 PM    Final    DG Abd Portable 1V  Result Date: 03/09/2022 CLINICAL DATA:  Feeding tube placement. EXAM: PORTABLE ABDOMEN - 1 VIEW COMPARISON:  Radiographs earlier the same date and 03-30-2022. CT 10/27/2010. FINDINGS: 1635 hours. The tip of the feeding tube is unchanged from the earlier study, projecting over the left iliac crest and likely in the distal body of the stomach. The visualized bowel gas pattern is nonobstructive. Persistent left lower lobe airspace disease with probable left pleural effusion. IMPRESSION: Unchanged position of the feeding tube, tip likely in the distal body of the stomach. Electronically Signed   By: Carey Bullocks M.D.   On: 03/09/2022 16:56   DG Abd Portable 1V  Result Date: 03/09/2022 CLINICAL DATA:  Feeding tube placement EXAM: PORTABLE ABDOMEN - 1 VIEW COMPARISON:  Abdomen March 30, 2022 FINDINGS: Feeding tube tip is in the distal body  of the stomach. NG tube is been removed since the prior study. Nonobstructive bowel gas pattern. Gas in nondilated transverse colon. Surgical clips in the right upper quadrant. Progression of left lower lobe airspace disease. IMPRESSION: Feeding tube tip in the distal body of the stomach. Progression of left lower lobe airspace disease. Electronically Signed   By: Marlan Palau M.D.   On: 03/09/2022 11:33   IR PERCUTANEOUS ART THROMBECTOMY/INFUSION INTRACRANIAL INC DIAG ANGIO  Result Date: 03/08/2022 INDICATION: New onset right gaze deviation, left sided neglect, and left hemiparesis. Occluded right middle cerebral artery CT angiogram of the head and neck. EXAM: 1. EMERGENT LARGE VESSEL OCCLUSION THROMBOLYSIS (anterior CIRCULATION) COMPARISON:  CT angiogram of the head and neck of 2022/03/30. MEDICATIONS: Ancef 2 g IV antibiotic was administered within 1 hour of the procedure. ANESTHESIA/SEDATION: General anesthesia. CONTRAST:  Omnipaque 300 approximately 95 mL. FLUOROSCOPY TIME:  Fluoroscopy Time: 28 minutes 30 seconds (1106 mGy). COMPLICATIONS: None immediate. TECHNIQUE: Following a full explanation of the procedure along with the potential associated complications, an informed witnessed consent was obtained. The risks of  intracranial hemorrhage of 10%, worsening neurological deficit, ventilator dependency, death and inability to revascularize were all reviewed in detail with the patient's husband. The patient was then put under general anesthesia by the Department of Anesthesiology at [facility]. The right groin was prepped and draped in the usual sterile fashion. Thereafter using modified Seldinger technique, transfemoral access into the right common femoral artery was obtained without difficulty. Over a 0.035 inch guidewire an 8 French 15 cm Pinnacle sheath was inserted. Through this, and also over a 0.035 inch guidewire a combination of the 5.5 [place] 125 cm Berenstein support catheter  inside of an 087 95 cm balloon guide catheter was advanced over a 5 [place] JB 1 catheter was advanced and positioned initially in the innominate artery and then distal right internal carotid artery. Arteriograms were obtained of the right vertebral artery and the left vertebral artery at the termination of the procedure. FINDINGS: Innominate arteriogram demonstrates the right common carotid artery and the right subclavian artery to be widely patent. The right common carotid arteriogram demonstrates the right external carotid artery and its major branches to be widely patent. The right internal carotid artery at the bulb to the cranial skull base is widely patent. The petrous, the cavernous and the supraclinoid ICA demonstrate wide patency. Complete occlusion of the right middle cerebral artery and its distal M1 segment is noted. The right anterior cerebral artery opacifies into the capillary and venous phases with transient cross-filling of the left anterior cerebral artery A2 segment. PROCEDURE: Through the balloon guide catheter in the distal right internal carotid artery, an over an 014 inch standard micro guidewire with a moderate J configuration, an 021 160 microcatheter inside of an 071 Zoom aspiration catheter was advanced to the supraclinoid right ICA. The micro guidewire was then gently manipulated using a torque device and advanced through the occluded right middle cerebral artery inferior division M1 M2 region followed by the microcatheter followed by the 071 Zoom aspiration catheter which was buried into the occluded right middle cerebral artery just distal to its occlusion. The micro guidewire, and the microcatheter were retrieved and removed. Contact aspiration was then applied at hub of the 071 Zoom aspiration catheter, and also at the hub of the balloon guide catheter with proximal flow arrest for approximately 2 minutes. Thereafter, the Zoom aspiration catheter was retrieved and removed. Following  reversal of flow arrest, a control arteriogram performed through the balloon guide catheter now demonstrated revascularization of the occluded right middle cerebral artery, and also of the inferior division M2 segment. The superior division continued to demonstrate a stump at its origin. A second pass was then made using the 055 132 Zoom aspiration catheter advanced in combination with a 160 cm 021 microcatheter over a J-tip micro guidewire to the right M1 segment. Using a torque device, the micro guidewire was advanced through the occluded prominent inferior division into the M2 M3 region followed by the Zoom aspiration catheter. The microcatheter and the micro guidewire were removed. Constant aspiration was then applied at the hub of the Zoom aspiration catheter in the M2 M3 segment of the inferior division for approximately a minute. With proximal flow arrest in the right internal carotid artery, the Zoom aspiration catheter was removed. A control arteriogram performed following reversal of flow arrest now demonstrated near complete revascularization of the inferior division with a distal M3 M4 branch demonstrating spasm which responded to 3 aliquots of 25 mcg of nitroglycerin. A third pass was then made using again  an 055 Zoom aspiration catheter with the microcatheter advanced again into the occluded superior division. The micro guidewire and the microcatheter were advanced distally to the M2 M3 region. The guidewire was removed. Good aspiration obtained from the hub of the microcatheter. A gentle control arteriogram performed through the microcatheter demonstrated safe positioning of tip of the microcatheter. A 3 mm x 20 mm Solitaire X retrieval device was then deployed in the superior division in the usual manner. The 055 Zoom aspiration advanced just inside the occluded superior division. Thereafter, constant aspiration was then applied at the hub of the Zoom aspiration catheter, and also at the hub of the  balloon guide catheter for approximately 30 seconds. The combination was then retrieved and removed. Performed following reversal of flow arrest demonstrated now revascularization of the superior division in its entirety. A TICI 2C revascularization was achieved. Distal occlusion was evident at temporal branch in the distal M3 M4 region, and also the inferior division in the distal M3/M4 region. A control arteriogram performed through the balloon guide catheter in the right common carotid artery demonstrated patency of the extra cranially and intracranially internal carotid artery with continued maintenance of a TICI 2C revascularization. There continued to be an area of attenuation in the distal parietal cortical region in keeping with a hypoattenuation noted on a CT scan of the brain. The balloon guide catheter was removed. Diagnostic arteriograms were then performed of both vertebral arteries selectively. These demonstrated wide patency of both the vertebral arteries to the cranial skull base. Patency was noted of the basilar artery, the posterior cerebral arteries, the superior cerebellar arteries and the anterior-inferior cerebellar arteries into the capillary and venous phases. The 8 French sheath was removed. Hemostasis was achieved at the right groin puncture site with an 8 French Angio-Seal closure device. Distal pulses remained Dopplerable in both feet unchanged prior to the procedure. A flat panel CT of the brain demonstrated mild-to-moderate hyperattenuation in the subarachnoid space in the right sylvian fissure which extended to a lesser extent to the right parietal in the temporal regions. No intra parenchymal hyperattenuation was evident. The patient was left intubated because of her medical condition. She was then transferred to the neuro ICU for post revascularization care. IMPRESSION: Status post endovascular revascularization of occluded right middle cerebral artery M1 segment with 1 pass with an  071 contact aspiration device, and 1 pass with a 055 Zoom contact aspiration, and 1 pass with a 3 mm x 20 Solitaire X retrieval device and contact aspiration achieving a TICI 2C revascularization. PLAN: As per stroke service. Electronically Signed   By: Julieanne Cotton M.D.   On: 03/08/2022 08:17   IR CT Head Ltd  Result Date: 03/08/2022 INDICATION: New onset right gaze deviation, left sided neglect, and left hemiparesis. Occluded right middle cerebral artery CT angiogram of the head and neck. EXAM: 1. EMERGENT LARGE VESSEL OCCLUSION THROMBOLYSIS (anterior CIRCULATION) COMPARISON:  CT angiogram of the head and neck of March 06, 2022. MEDICATIONS: Ancef 2 g IV antibiotic was administered within 1 hour of the procedure. ANESTHESIA/SEDATION: General anesthesia. CONTRAST:  Omnipaque 300 approximately 95 mL. FLUOROSCOPY TIME:  Fluoroscopy Time: 28 minutes 30 seconds (1106 mGy). COMPLICATIONS: None immediate. TECHNIQUE: Following a full explanation of the procedure along with the potential associated complications, an informed witnessed consent was obtained. The risks of intracranial hemorrhage of 10%, worsening neurological deficit, ventilator dependency, death and inability to revascularize were all reviewed in detail with the patient's husband. The patient was then put  under general anesthesia by the Department of Anesthesiology at [facility]. The right groin was prepped and draped in the usual sterile fashion. Thereafter using modified Seldinger technique, transfemoral access into the right common femoral artery was obtained without difficulty. Over a 0.035 inch guidewire an 8 French 15 cm Pinnacle sheath was inserted. Through this, and also over a 0.035 inch guidewire a combination of the 5.5 [place] 125 cm Berenstein support catheter inside of an 087 95 cm balloon guide catheter was advanced over a 5 [place] JB 1 catheter was advanced and positioned initially in the innominate artery and then  distal right internal carotid artery. Arteriograms were obtained of the right vertebral artery and the left vertebral artery at the termination of the procedure. FINDINGS: Innominate arteriogram demonstrates the right common carotid artery and the right subclavian artery to be widely patent. The right common carotid arteriogram demonstrates the right external carotid artery and its major branches to be widely patent. The right internal carotid artery at the bulb to the cranial skull base is widely patent. The petrous, the cavernous and the supraclinoid ICA demonstrate wide patency. Complete occlusion of the right middle cerebral artery and its distal M1 segment is noted. The right anterior cerebral artery opacifies into the capillary and venous phases with transient cross-filling of the left anterior cerebral artery A2 segment. PROCEDURE: Through the balloon guide catheter in the distal right internal carotid artery, an over an 014 inch standard micro guidewire with a moderate J configuration, an 021 160 microcatheter inside of an 071 Zoom aspiration catheter was advanced to the supraclinoid right ICA. The micro guidewire was then gently manipulated using a torque device and advanced through the occluded right middle cerebral artery inferior division M1 M2 region followed by the microcatheter followed by the 071 Zoom aspiration catheter which was buried into the occluded right middle cerebral artery just distal to its occlusion. The micro guidewire, and the microcatheter were retrieved and removed. Contact aspiration was then applied at hub of the 071 Zoom aspiration catheter, and also at the hub of the balloon guide catheter with proximal flow arrest for approximately 2 minutes. Thereafter, the Zoom aspiration catheter was retrieved and removed. Following reversal of flow arrest, a control arteriogram performed through the balloon guide catheter now demonstrated revascularization of the occluded right middle  cerebral artery, and also of the inferior division M2 segment. The superior division continued to demonstrate a stump at its origin. A second pass was then made using the 055 132 Zoom aspiration catheter advanced in combination with a 160 cm 021 microcatheter over a J-tip micro guidewire to the right M1 segment. Using a torque device, the micro guidewire was advanced through the occluded prominent inferior division into the M2 M3 region followed by the Zoom aspiration catheter. The microcatheter and the micro guidewire were removed. Constant aspiration was then applied at the hub of the Zoom aspiration catheter in the M2 M3 segment of the inferior division for approximately a minute. With proximal flow arrest in the right internal carotid artery, the Zoom aspiration catheter was removed. A control arteriogram performed following reversal of flow arrest now demonstrated near complete revascularization of the inferior division with a distal M3 M4 branch demonstrating spasm which responded to 3 aliquots of 25 mcg of nitroglycerin. A third pass was then made using again an 055 Zoom aspiration catheter with the microcatheter advanced again into the occluded superior division. The micro guidewire and the microcatheter were advanced distally to the M2 M3  region. The guidewire was removed. Good aspiration obtained from the hub of the microcatheter. A gentle control arteriogram performed through the microcatheter demonstrated safe positioning of tip of the microcatheter. A 3 mm x 20 mm Solitaire X retrieval device was then deployed in the superior division in the usual manner. The 055 Zoom aspiration advanced just inside the occluded superior division. Thereafter, constant aspiration was then applied at the hub of the Zoom aspiration catheter, and also at the hub of the balloon guide catheter for approximately 30 seconds. The combination was then retrieved and removed. Performed following reversal of flow arrest demonstrated  now revascularization of the superior division in its entirety. A TICI 2C revascularization was achieved. Distal occlusion was evident at temporal branch in the distal M3 M4 region, and also the inferior division in the distal M3/M4 region. A control arteriogram performed through the balloon guide catheter in the right common carotid artery demonstrated patency of the extra cranially and intracranially internal carotid artery with continued maintenance of a TICI 2C revascularization. There continued to be an area of attenuation in the distal parietal cortical region in keeping with a hypoattenuation noted on a CT scan of the brain. The balloon guide catheter was removed. Diagnostic arteriograms were then performed of both vertebral arteries selectively. These demonstrated wide patency of both the vertebral arteries to the cranial skull base. Patency was noted of the basilar artery, the posterior cerebral arteries, the superior cerebellar arteries and the anterior-inferior cerebellar arteries into the capillary and venous phases. The 8 French sheath was removed. Hemostasis was achieved at the right groin puncture site with an 8 French Angio-Seal closure device. Distal pulses remained Dopplerable in both feet unchanged prior to the procedure. A flat panel CT of the brain demonstrated mild-to-moderate hyperattenuation in the subarachnoid space in the right sylvian fissure which extended to a lesser extent to the right parietal in the temporal regions. No intra parenchymal hyperattenuation was evident. The patient was left intubated because of her medical condition. She was then transferred to the neuro ICU for post revascularization care. 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 PLAN: As per stroke service. Electronically Signed   By: Luanne Bras M.D.   On: 03/08/2022 08:17   IR ANGIO VERTEBRAL SEL VERTEBRAL UNI R MOD SED  Result Date: 03/08/2022 INDICATION: New onset right gaze deviation, left sided neglect, and left hemiparesis. Occluded right middle cerebral artery CT angiogram of the head and neck. EXAM: 1. EMERGENT LARGE VESSEL OCCLUSION THROMBOLYSIS (anterior CIRCULATION) COMPARISON:  CT angiogram of the head and neck of March 06, 2022. MEDICATIONS: Ancef 2 g IV antibiotic was administered within 1 hour of the procedure. ANESTHESIA/SEDATION: General anesthesia. CONTRAST:  Omnipaque 300 approximately 95 mL. FLUOROSCOPY TIME:  Fluoroscopy Time: 28 minutes 30 seconds (1106 mGy). COMPLICATIONS: None immediate. TECHNIQUE: Following a full explanation of the procedure along with the potential associated complications, an informed witnessed consent was obtained. The risks of intracranial hemorrhage of 10%, worsening neurological deficit, ventilator dependency, death and inability to revascularize were all reviewed in detail with the patient's husband. The patient was then put under general anesthesia by the Department of Anesthesiology at [facility]. The right groin was prepped and draped in the usual sterile  fashion. Thereafter using modified Seldinger technique, transfemoral access into the right common femoral artery was obtained without difficulty. Over a 0.035 inch guidewire an 8 French 15 cm Pinnacle sheath was inserted. Through this, and also over a 0.035 inch guidewire a combination of the 5.5 [place] 125 cm Berenstein support catheter inside of an 087 95 cm balloon guide catheter was advanced over a 5 [place] JB 1 catheter was advanced and positioned initially in the innominate artery and then distal right internal carotid artery. Arteriograms were obtained of the right vertebral artery and the left vertebral artery at the  termination of the procedure. FINDINGS: Innominate arteriogram demonstrates the right common carotid artery and the right subclavian artery to be widely patent. The right common carotid arteriogram demonstrates the right external carotid artery and its major branches to be widely patent. The right internal carotid artery at the bulb to the cranial skull base is widely patent. The petrous, the cavernous and the supraclinoid ICA demonstrate wide patency. Complete occlusion of the right middle cerebral artery and its distal M1 segment is noted. The right anterior cerebral artery opacifies into the capillary and venous phases with transient cross-filling of the left anterior cerebral artery A2 segment. PROCEDURE: Through the balloon guide catheter in the distal right internal carotid artery, an over an 014 inch standard micro guidewire with a moderate J configuration, an 021 160 microcatheter inside of an 071 Zoom aspiration catheter was advanced to the supraclinoid right ICA. The micro guidewire was then gently manipulated using a torque device and advanced through the occluded right middle cerebral artery inferior division M1 M2 region followed by the microcatheter followed by the 071 Zoom aspiration catheter which was buried into the occluded right middle cerebral artery just distal to its occlusion. The micro guidewire, and the microcatheter were retrieved and removed. Contact aspiration was then applied at hub of the 071 Zoom aspiration catheter, and also at the hub of the balloon guide catheter with proximal flow arrest for approximately 2 minutes. Thereafter, the Zoom aspiration catheter was retrieved and removed. Following reversal of flow arrest, a control arteriogram performed through the balloon guide catheter now demonstrated revascularization of the occluded right middle cerebral artery, and also of the inferior division M2 segment. The superior division continued to demonstrate a stump at its origin. A  second pass was then made using the 055 132 Zoom aspiration catheter advanced in combination with a 160 cm 021 microcatheter over a J-tip micro guidewire to the right M1 segment. Using a torque device, the micro guidewire was advanced through the occluded prominent inferior division into the M2 M3 region followed by the Zoom aspiration catheter. The microcatheter and the micro guidewire were removed. Constant aspiration was then applied at the hub of the Zoom aspiration catheter in the M2 M3 segment of the inferior division for approximately a minute. With proximal flow arrest in the right internal carotid artery, the Zoom aspiration catheter was removed. A control arteriogram performed following reversal of flow arrest now demonstrated near complete revascularization of the inferior division with a distal M3 M4 branch demonstrating spasm which responded to 3 aliquots of 25 mcg of nitroglycerin. A third pass was then made using again an 055 Zoom aspiration catheter with the microcatheter advanced again into the occluded superior division. The micro guidewire and the microcatheter were advanced distally to the M2 M3 region. The guidewire was removed. Good aspiration obtained from the hub of the microcatheter. A gentle control arteriogram performed through the microcatheter demonstrated  safe positioning of tip of the microcatheter. A 3 mm x 20 mm Solitaire X retrieval device was then deployed in the superior division in the usual manner. The 055 Zoom aspiration advanced just inside the occluded superior division. Thereafter, constant aspiration was then applied at the hub of the Zoom aspiration catheter, and also at the hub of the balloon guide catheter for approximately 30 seconds. The combination was then retrieved and removed. Performed following reversal of flow arrest demonstrated now revascularization of the superior division in its entirety. A TICI 2C revascularization was achieved. Distal occlusion was evident  at temporal branch in the distal M3 M4 region, and also the inferior division in the distal M3/M4 region. A control arteriogram performed through the balloon guide catheter in the right common carotid artery demonstrated patency of the extra cranially and intracranially internal carotid artery with continued maintenance of a TICI 2C revascularization. There continued to be an area of attenuation in the distal parietal cortical region in keeping with a hypoattenuation noted on a CT scan of the brain. The balloon guide catheter was removed. Diagnostic arteriograms were then performed of both vertebral arteries selectively. These demonstrated wide patency of both the vertebral arteries to the cranial skull base. Patency was noted of the basilar artery, the posterior cerebral arteries, the superior cerebellar arteries and the anterior-inferior cerebellar arteries into the capillary and venous phases. The 8 French sheath was removed. Hemostasis was achieved at the right groin puncture site with an 8 French Angio-Seal closure device. Distal pulses remained Dopplerable in both feet unchanged prior to the procedure. A flat panel CT of the brain demonstrated mild-to-moderate hyperattenuation in the subarachnoid space in the right sylvian fissure which extended to a lesser extent to the right parietal in the temporal regions. No intra parenchymal hyperattenuation was evident. The patient was left intubated because of her medical condition. She was then transferred to the neuro ICU for post revascularization care. IMPRESSION: Status post endovascular revascularization of occluded right middle cerebral artery M1 segment with 1 pass with an 071 contact aspiration device, and 1 pass with a 055 Zoom contact aspiration, and 1 pass with a 3 mm x 20 Solitaire X retrieval device and contact aspiration achieving a TICI 2C revascularization. PLAN: As per stroke service. Electronically Signed   By: Luanne Bras M.D.   On:  03/08/2022 08:17   MR ANGIO HEAD WO CONTRAST  Result Date: 03/07/2022 CLINICAL DATA:  Stroke, follow-up. EXAM: MRI HEAD WITHOUT CONTRAST MRA HEAD WITHOUT CONTRAST TECHNIQUE: Multiplanar, multi-echo pulse sequences of the brain and surrounding structures were acquired without intravenous contrast. Angiographic images of the Circle of Willis were acquired using MRA technique without intravenous contrast. COMPARISON:  Head CT March 07, 2022. FINDINGS: MRI HEAD FINDINGS Brain: Scattered and confluent areas of restricted diffusion are seen in the bilateral cerebral and cerebellar hemispheres with the largest confluent areas involving the right temporoparietal occipital region and bilateral frontal lobes. Susceptibility artifact is noted in the sulci of the right is temporoparietal occipital region, bilateral occipital horns and along the tentorium, with distribution similar to prior CT, likely representing subarachnoid hemorrhage. No significant mass effect, intraparenchymal hemorrhage, hydrocephalus or mass lesion. Vascular: Normal flow voids. Skull and upper cervical spine: Normal marrow signal. Sinuses/Orbits: Mucosal thickening and bubbly secretion within the right maxillary sinus. Right mastoid effusion. The orbits are maintained. Other: None. MRA HEAD FINDINGS Anterior circulation: The visualized portions of the distal cervical and intracranial internal carotid arteries are widely patent with normal flow related  enhancement. The bilateral anterior cerebral arteries and middle cerebral arteries are widely patent with anterograde flow without high-grade flow-limiting stenosis or proximal branch occlusion. No intracranial aneurysm within the anterior circulation. Posterior circulation: The vertebral arteries are widely patent with anterograde flow. Vertebrobasilar junction and basilar artery are widely patent with anterograde flow without evidence of basilar stenosis or aneurysm. Posterior cerebral arteries  are normal bilaterally. No intracranial aneurysm within the posterior circulation. Anatomic variants: None significant. IMPRESSION: 1. Scattered and confluent areas of restricted diffusion in the bilateral cerebral and cerebellar hemispheres, consistent with acute infarcts, likely embolic. 2. Small amount of subarachnoid hemorrhage in the right temporoparietal occipital region, bilateral occipital horns and along the tentorium. 3. No evidence of high-grade flow-limiting stenosis or proximal branch occlusion in the intracranial circulation. Electronically Signed   By: Pedro Earls M.D.   On: 03/07/2022 13:22   MR BRAIN WO CONTRAST  Result Date: 03/07/2022 CLINICAL DATA:  Stroke, follow-up. EXAM: MRI HEAD WITHOUT CONTRAST MRA HEAD WITHOUT CONTRAST TECHNIQUE: Multiplanar, multi-echo pulse sequences of the brain and surrounding structures were acquired without intravenous contrast. Angiographic images of the Circle of Willis were acquired using MRA technique without intravenous contrast. COMPARISON:  Head CT March 07, 2022. FINDINGS: MRI HEAD FINDINGS Brain: Scattered and confluent areas of restricted diffusion are seen in the bilateral cerebral and cerebellar hemispheres with the largest confluent areas involving the right temporoparietal occipital region and bilateral frontal lobes. Susceptibility artifact is noted in the sulci of the right is temporoparietal occipital region, bilateral occipital horns and along the tentorium, with distribution similar to prior CT, likely representing subarachnoid hemorrhage. No significant mass effect, intraparenchymal hemorrhage, hydrocephalus or mass lesion. Vascular: Normal flow voids. Skull and upper cervical spine: Normal marrow signal. Sinuses/Orbits: Mucosal thickening and bubbly secretion within the right maxillary sinus. Right mastoid effusion. The orbits are maintained. Other: None. MRA HEAD FINDINGS Anterior circulation: The visualized portions of  the distal cervical and intracranial internal carotid arteries are widely patent with normal flow related enhancement. The bilateral anterior cerebral arteries and middle cerebral arteries are widely patent with anterograde flow without high-grade flow-limiting stenosis or proximal branch occlusion. No intracranial aneurysm within the anterior circulation. Posterior circulation: The vertebral arteries are widely patent with anterograde flow. Vertebrobasilar junction and basilar artery are widely patent with anterograde flow without evidence of basilar stenosis or aneurysm. Posterior cerebral arteries are normal bilaterally. No intracranial aneurysm within the posterior circulation. Anatomic variants: None significant. IMPRESSION: 1. Scattered and confluent areas of restricted diffusion in the bilateral cerebral and cerebellar hemispheres, consistent with acute infarcts, likely embolic. 2. Small amount of subarachnoid hemorrhage in the right temporoparietal occipital region, bilateral occipital horns and along the tentorium. 3. No evidence of high-grade flow-limiting stenosis or proximal branch occlusion in the intracranial circulation. Electronically Signed   By: Pedro Earls M.D.   On: 03/07/2022 13:22   ECHOCARDIOGRAM COMPLETE  Result Date: 03/07/2022    ECHOCARDIOGRAM REPORT   Patient Name:   Sherry Olson [facility] Date of Exam: 03/07/2022 Medical Rec #:  [id number]       Height:       63.0 in Accession #:    [id number]      Weight:       114.2 lb Date of Birth:  09/27/1945        BSA:          1.524 m Patient Age:    30 years        BP:  121/48 mmHg Patient Gender: F               HR:           69 bpm. Exam Location:  Inpatient Procedure: 2D Echo, Cardiac Doppler and Color Doppler Indications:    Stroke I63.9  History:        Patient has no prior history of Echocardiogram examinations.                 CAD, Stroke; Risk Factors:Hypertension and Dyslipidemia.  Sonographer:    Ronny Flurry  Referring Phys: [id number] [place]  1. Left ventricular ejection fraction, by estimation, is 60 to 65%. The left ventricle has normal function. The left ventricle has no regional wall motion abnormalities. Left ventricular diastolic parameters were normal.  2. Right ventricular systolic function is normal. The right ventricular size is normal.  3. Moderate but not quite circumferential effusion with dilated IVC Mild diastolic RV collapse on 2D but no variation in mitral inflow Suggest clinical correltation and repeat TTE in 2 weeks . Moderate pericardial effusion. The pericardial effusion is posterior to the left ventricle, lateral to the left ventricle and anterior to the right ventricle.  4. The mitral valve is abnormal. Mild mitral valve regurgitation. No evidence of mitral stenosis.  5. The aortic valve is tricuspid. There is mild calcification of the aortic valve. There is mild thickening of the aortic valve. Aortic valve regurgitation is mild to moderate. Aortic valve sclerosis is present, with no evidence of aortic valve stenosis.  6. The inferior vena cava is dilated in size with >50% respiratory variability, suggesting right atrial pressure of 8 mmHg. FINDINGS  Left Ventricle: Left ventricular ejection fraction, by estimation, is 60 to 65%. The left ventricle has normal function. The left ventricle has no regional wall motion abnormalities. The left ventricular internal cavity size was normal in size. There is  no left ventricular hypertrophy. Left ventricular diastolic parameters were normal. Right Ventricle: The right ventricular size is normal. No increase in right ventricular wall thickness. Right ventricular systolic function is normal. Left Atrium: Left atrial size was normal in size. Right Atrium: Right atrial size was normal in size. Pericardium: Moderate but not quite circumferential effusion with dilated IVC Mild diastolic RV collapse on 2D but no variation in mitral inflow  Suggest clinical correltation and repeat TTE in 2 weeks. A moderately sized pericardial effusion is present. The pericardial effusion is posterior to the left ventricle, lateral to the left ventricle and anterior to the right ventricle. Mitral Valve: The mitral valve is abnormal. There is mild thickening of the mitral valve leaflet(s). There is mild calcification of the mitral valve leaflet(s). Mild mitral valve regurgitation. No evidence of mitral valve stenosis. Tricuspid Valve: The tricuspid valve is normal in structure. Tricuspid valve regurgitation is trivial. No evidence of tricuspid stenosis. Aortic Valve: The aortic valve is tricuspid. There is mild calcification of the aortic valve. There is mild thickening of the aortic valve. Aortic valve regurgitation is mild to moderate. Aortic regurgitation PHT measures 466 msec. Aortic valve sclerosis  is present, with no evidence of aortic valve stenosis. Aortic valve mean gradient measures 4.7 mmHg. Aortic valve peak gradient measures 8.9 mmHg. Aortic valve area, by VTI measures 2.33 cm. Pulmonic Valve: The pulmonic valve was normal in structure. Pulmonic valve regurgitation is not visualized. No evidence of pulmonic stenosis. Aorta: The aortic root is normal in size and structure. Venous: The inferior vena cava is dilated in size with  greater than 50% respiratory variability, suggesting right atrial pressure of 8 mmHg. IAS/Shunts: No atrial level shunt detected by color flow Doppler.  LEFT VENTRICLE PLAX 2D LVIDd:         4.70 cm   Diastology LVIDs:         3.20 cm   LV e' medial:    7.72 cm/s LV PW:         0.90 cm   LV E/e' medial:  10.0 LV IVS:        0.80 cm   LV e' lateral:   9.68 cm/s LVOT diam:     1.90 cm   LV E/e' lateral: 8.0 LV SV:         64 LV SV Index:   42 LVOT Area:     2.84 cm  RIGHT VENTRICLE             IVC RV Basal diam:  3.10 cm     IVC diam: 2.10 cm RV S prime:     13.10 cm/s TAPSE (M-mode): 1.9 cm LEFT ATRIUM             Index        RIGHT  ATRIUM           Index LA diam:        3.70 cm 2.43 cm/m   RA Area:     15.00 cm LA Vol (A2C):   47.6 ml 31.24 ml/m  RA Volume:   37.00 ml  24.28 ml/m LA Vol (A4C):   51.1 ml 33.53 ml/m LA Biplane Vol: 50.2 ml 32.94 ml/m  AORTIC VALVE AV Area (Vmax):    2.53 cm AV Area (Vmean):   2.18 cm AV Area (VTI):     2.33 cm AV Vmax:           149.33 cm/s AV Vmean:          98.633 cm/s AV VTI:            0.274 m AV Peak Grad:      8.9 mmHg AV Mean Grad:      4.7 mmHg LVOT Vmax:         133.00 cm/s LVOT Vmean:        75.800 cm/s LVOT VTI:          0.225 m LVOT/AV VTI ratio: 0.82 AI PHT:            466 msec  AORTA Ao Root diam: 3.00 cm Ao Asc diam:  3.10 cm MITRAL VALVE MV Area (PHT): 4.40 cm    SHUNTS MV Decel Time: 173 msec    Systemic VTI:  0.22 m MR Peak grad: 103.0 mmHg   Systemic Diam: 1.90 cm MR Mean grad: 68.0 mmHg MR Vmax:      507.50 cm/s MR Vmean:     385.0 cm/s MV E velocity: 77.50 cm/s MV A velocity: 60.30 cm/s MV E/A ratio:  1.29 Jenkins Rouge MD Electronically signed by Jenkins Rouge MD Signature Date/Time: 03/07/2022/11:47:30 AM    Final    CT HEAD WO CONTRAST (5MM)  Result Date: 03/07/2022 CLINICAL DATA:  Status post right M1 thrombectomy EXAM: CT HEAD WITHOUT CONTRAST TECHNIQUE: Contiguous axial images were obtained from the base of the skull through the vertex without intravenous contrast. RADIATION DOSE REDUCTION: This exam was performed according to the departmental dose-optimization program which includes automated exposure control, adjustment of the mA and/or kV according to patient size and/or use of iterative reconstruction technique.  COMPARISON:  03/07/2022 interventional CT, 03/02/2022 CT head and CTA head neck FINDINGS: Brain: Redemonstrated hypodensity in the bilateral frontal lobes, right parietal lobe, and right occipital lobe. Previously noted cerebellar hypodensity is less conspicuous on this exam. Hyperdense material in the right temporal and frontal lobe (series 3, images 14-19),  which appears similar to the post thrombectomy CT and most likely represents contrast staining, although superimposed hemorrhage cannot be excluded. No new area of hypodensity. No mass, mass effect, or midline shift. No hydrocephalus. Vascular: No hyperdense vessel. Skull: Normal. Negative for fracture or focal lesion. Sinuses/Orbits: Mucosal thickening and bubbly fluid in the right maxillary sinus. The orbits are unremarkable. Other: The mastoids are well aerated. IMPRESSION: 1. Hyperdense material in the right temporal and frontal lobe, which appears similar to the post thrombectomy CT and most likely represents contrast staining, although superimposed hemorrhage cannot be excluded. 2. Redemonstrated hypodensity in the bilateral frontal lobes, right parietal lobe, and right occipital lobe, consistent with infarcts. Previously noted cerebellar hypodensity is less conspicuous on this exam. Electronically Signed   By: Merilyn Baba M.D.   On: 03/07/2022 02:32   DG Abd 1 View  Result Date: 03/05/2022 CLINICAL DATA:  [id number] Encounter for orogastric (OG) tube placement [id number] EXAM: ABDOMEN - 1 VIEW; PORTABLE CHEST - 1 VIEW COMPARISON:  None Available. FINDINGS: Enteric tube courses below the hemidiaphragm with tip and side port overlying the expected region of the gastric lumen. Enlarged cardiac silhouette with some component likely due to AP portable technique. The heart and mediastinal contours are within normal limits. Aortic calcification. Consolidative airspace opacity of the left upper lobe. No pulmonary edema. No pleural effusion. No pneumothorax. The bowel gas pattern is normal. Right upper quadrant clips noted. Bowel anastomotic staple sutures noted overlying the pelvis. Excretion of intravenous contrast within bilateral renal pelvis sees and urinary bladder lumen. No radio-opaque calculi or other significant radiographic abnormality are seen. No acute osseous abnormality. IMPRESSION: 1. Consolidative  airspace opacity of the left upper lobe. Followup PA and lateral chest X-ray is recommended in 3-4 weeks following therapy to ensure resolution and exclude underlying malignancy. 2. Nonobstructive bowel gas pattern. 3. Enteric tube in good position. 4.  Aortic Atherosclerosis (ICD10-I70.0). Electronically Signed   By: Iven Finn M.D.   On: 03/12/2022 18:18   DG CHEST PORT 1 VIEW  Result Date: 03/10/2022 CLINICAL DATA:  [id number] Encounter for orogastric (OG) tube placement [id number] EXAM: ABDOMEN - 1 VIEW; PORTABLE CHEST - 1 VIEW COMPARISON:  None Available. FINDINGS: Enteric tube courses below the hemidiaphragm with tip and side port overlying the expected region of the gastric lumen. Enlarged cardiac silhouette with some component likely due to AP portable technique. The heart and mediastinal contours are within normal limits. Aortic calcification. Consolidative airspace opacity of the left upper lobe. No pulmonary edema. No pleural effusion. No pneumothorax. The bowel gas pattern is normal. Right upper quadrant clips noted. Bowel anastomotic staple sutures noted overlying the pelvis. Excretion of intravenous contrast within bilateral renal pelvis sees and urinary bladder lumen. No radio-opaque calculi or other significant radiographic abnormality are seen. No acute osseous abnormality. IMPRESSION: 1. Consolidative airspace opacity of the left upper lobe. Followup PA and lateral chest X-ray is recommended in 3-4 weeks following therapy to ensure resolution and exclude underlying malignancy. 2. Nonobstructive bowel gas pattern. 3. Enteric tube in good position. 4.  Aortic Atherosclerosis (ICD10-I70.0). Electronically Signed   By: Iven Finn M.D.   On: 03/07/2022 18:18   CT HEAD CODE STROKE  WO CONTRAST  Addendum Date: 03/24/2022   ADDENDUM REPORT: 03/04/2022 15:28 ADDENDUM: Findings discussed with Dr. Lorrin Goodell via telephone at 3:20 p.m. Electronically Signed   By: Margaretha Sheffield M.D.   On:  03/23/2022 15:28   Result Date: 03/05/2022 CLINICAL DATA:  Code stroke.  Neuro deficit, acute, stroke suspected EXAM: CT HEAD WITHOUT CONTRAST TECHNIQUE: Contiguous axial images were obtained from the base of the skull through the vertex without intravenous contrast. RADIATION DOSE REDUCTION: This exam was performed according to the departmental dose-optimization program which includes automated exposure control, adjustment of the mA and/or kV according to patient size and/or use of iterative reconstruction technique. COMPARISON:  None Available. FINDINGS: Brain: Hypoattenuation loss of gray differentiation in bilateral frontal lobes and right parietal and occipital lobes, compatible with acute infarcts. Also, small areas of hypoattenuation in bilateral cerebellum, suggestive of acute infarcts. No evidence of acute hemorrhage, mass lesion, midline shift or hydrocephalus. Vascular: No hyperdense vessel identified. Skull: No acute fracture. Sinuses/Orbits: Partially imaged right maxillary sinus mucosal thickening with air-fluid level. No acute orbital findings. Other: No mastoid effusions. IMPRESSION: 1. Findings concerning for acute infarcts in bilateral frontal lobes, right parietal and occipital lobes and bilateral cerebellum. Given involvement of multiple vascular territories, consider embolic etiology. Also, recommend MRI to further characterize. 2. No acute hemorrhage. Electronically Signed: By: Margaretha Sheffield M.D. On: 03/05/2022 15:11   CT ANGIO HEAD NECK W WO CM (CODE STROKE)  Result Date: 03/24/2022 CLINICAL DATA:  Neuro deficit, acute, stroke suspected EXAM: CT ANGIOGRAPHY HEAD AND NECK TECHNIQUE: Multidetector CT imaging of the head and neck was performed using the standard protocol during bolus administration of intravenous contrast. Multiplanar CT image reconstructions and MIPs were obtained to evaluate the vascular anatomy. Carotid stenosis measurements (when applicable) are obtained utilizing  NASCET criteria, using the distal internal carotid diameter as the denominator. RADIATION DOSE REDUCTION: This exam was performed according to the departmental dose-optimization program which includes automated exposure control, adjustment of the mA and/or kV according to patient size and/or use of iterative reconstruction technique. COMPARISON:  None Available. FINDINGS: CTA NECK FINDINGS Aortic arch: Great vessel origins are patent. Right carotid system: No evidence of dissection, stenosis (50% or greater), or occlusion. Left carotid system: No evidence of dissection, stenosis (50% or greater), or occlusion. Vertebral arteries: Right dominant. No evidence of dissection, stenosis (50% or greater), or occlusion. Skeleton: Multilevel degenerative change.  No acute findings. Other neck: No acute findings. Upper chest: Extensive consolidation in the left upper lobe. Review of the MIP images confirms the above findings CTA HEAD FINDINGS Anterior circulation: Bilateral intracranial ICAs are patent with mild calcific atherosclerosis. Occlusion of the distal right M1 MCA with some opacification of more distal right MCA branches. Left MCA and bilateral ACAs are patent. Posterior circulation: Bilateral intradural vertebral arteries, basilar artery and bilateral posterior cerebral arteries are patent without proximal high-grade stenosis. Venous sinuses: As permitted by contrast timing, patent. Review of the MIP images confirms the above findings IMPRESSION: 1. Distal right M1 MCA occlusion. 2. Extensive consolidation in the left upper lobe, suspicious for pneumonia and/or aspiration. Findings discussed with Dr. Lorrin Goodell via telephone at 3:20 p.m. Electronically Signed   By: Margaretha Sheffield M.D.   On: 03/05/2022 15:22   DG Chest Port 1 View  Result Date: 02/28/2022 CLINICAL DATA:  Cough. EXAM: PORTABLE CHEST 1 VIEW COMPARISON:  09/17/2010 FINDINGS: Patchy densities throughout the midportion of the left lung  particularly the left suprahilar region. Focal dense nodular structure in the  periphery of the left lung measures up to 1.4 cm. Heart size appears to be slightly enlarged. Right lung is clear. Atherosclerotic calcifications at the aortic arch. Negative for a pneumothorax. Lucency overlying the left first rib could be related to overlying soft tissue. IMPRESSION: 1. Patchy densities in the left lung. Findings are concerning for pneumonia. Followup PA and lateral chest X-ray is recommended in 3-4 weeks following trial of antibiotic therapy to ensure resolution and exclude underlying malignancy. 2. 1.4 cm nodular density in the periphery of the left lung. Recommend attention on follow-up imaging or further characterization with chest CT. Electronically Signed   By: Markus Daft M.D.   On: 02/28/2022 15:05   CT Head Wo Contrast  Result Date: 02/28/2022 CLINICAL DATA:  Provided history: Headache, new or worsening. Additional history provided: Headache, blurry vision, nausea/diarrhea, dry cough. EXAM: CT HEAD WITHOUT CONTRAST TECHNIQUE: Contiguous axial images were obtained from the base of the skull through the vertex without intravenous contrast. RADIATION DOSE REDUCTION: This exam was performed according to the departmental dose-optimization program which includes automated exposure control, adjustment of the mA and/or kV according to patient size and/or use of iterative reconstruction technique. COMPARISON:  Head CT 12/13/2018. FINDINGS: Brain: No age advanced or lobar predominant parenchymal atrophy. There is no acute intracranial hemorrhage. No demarcated cortical infarct. No extra-axial fluid collection. No evidence of an intracranial mass. No midline shift. Vascular: No hyperdense vessel. Atherosclerotic calcifications. Skull: No fracture or aggressive osseous lesion. Sinuses/Orbits: No mass or acute finding within the imaged orbits. Small to moderate-volume frothy secretions, and background mild mucosal  thickening, within the right maxillary sinus at the imaged levels. IMPRESSION: No evidence of acute intracranial abnormality. Right maxillary sinusitis. Electronically Signed   By: Kellie Simmering D.O.   On: 02/28/2022 13:55    Microbiology Recent Results (from the past 240 hour(s))  MRSA Next Gen by PCR, Nasal     Status: None   Collection Time: 03/15/2022  5:27 PM   Specimen: Nasal Mucosa; Nasal Swab  Result Value Ref Range Status   MRSA by PCR Next Gen NOT DETECTED NOT DETECTED Final    Comment: (NOTE) The GeneXpert MRSA Assay (FDA approved for NASAL specimens only), is one component of a comprehensive MRSA colonization surveillance program. It is not intended to diagnose MRSA infection nor to guide or monitor treatment for MRSA infections. Test performance is not FDA approved in patients less than 65 years old. Performed at [facility], [place] [address], [place], [place] [address]   Culture, Respiratory w Gram Stain     Status: None   Collection Time: 03/07/22 12:26 PM   Specimen: Tracheal Aspirate; Respiratory  Result Value Ref Range Status   Specimen Description TRACHEAL ASPIRATE  Final   Special Requests NONE  Final   Gram Stain NO WBC SEEN NO ORGANISMS SEEN   Final   Culture   Final    RARE Normal respiratory flora-no Staph aureus or Pseudomonas seen Performed at [facility], [address] [place], [place] [address]    Report Status 03/09/2022 FINAL  Final    Lab Basic Metabolic Panel: Recent Labs  Lab 03/09/22 1648 03/09/22 1648 03/10/22 0645 03/11/22 1700 03/12/22 0327 03/13/22 0454 03/17/2022 0428 02/23/2022 2033 03/15/22 0420  NA  --    < > 139  --  141 140 140 140 139  K  --    < > 3.9  --  3.3* 3.6 3.7 3.2* 4.2  CL  --    < >  110  --  106 107 103 103 100  CO2  --    < > 20*  --  25 26 29 28 30   GLUCOSE  --    < > 153*  --  141* 151* 116* 178* 111*  BUN  --    < > 13  --  14 12 14 14 15   CREATININE  --    < > 0.58  --  0.58 0.54 0.59 0.57 0.66   CALCIUM  --    < > 8.1*  --  8.5* 8.1* 8.3* 8.2* 8.4*  MG 1.9  --  1.8   < > 1.6* 2.0 1.7 1.7 1.7  PHOS 3.1  --  2.5  --   --   --   --   --   --    < > = values in this interval not displayed.   Liver Function Tests: No results for input(s): "AST", "ALT", "ALKPHOS", "BILITOT", "PROT", "ALBUMIN" in the last 168 hours. No results for input(s): "LIPASE", "AMYLASE" in the last 168 hours. No results for input(s): "AMMONIA" in the last 168 hours. CBC: Recent Labs  Lab 03/10/22 0645 03/12/22 0327 03/13/22 0454 03/02/2022 0428 03/15/22 0420  WBC 11.6* 12.3* 11.1* 10.6* 11.9*  HGB 10.0* 9.9* 8.5* 8.1* 8.0*  HCT 29.8* 29.9* 25.5* 25.5* 24.4*  MCV 86.9 86.9 88.2 90.4 88.1  PLT 142* 196 199 220 211   Cardiac Enzymes: No results for input(s): "CKTOTAL", "CKMB", "CKMBINDEX", "TROPONINI" in the last 168 hours. Sepsis Labs: Recent Labs  Lab 03/12/22 0327 03/13/22 0454 03/22/2022 0428 03/15/22 0420  WBC 12.3* 11.1* 10.6* 11.9*    Procedures/Operations  Stroke: right MCA large infarct with scattered areas of infarct in bilateral cerebral and cerebellar with right M1 occlusion s/p IR with TICI3, likely embolic due to afib non compliant with eliquis   Code Stroke CT head- findings concerning for acute infarcts in bilateral frontal lobes, right parietal and occipital lobes and bilateral cerebellum.  CTA head & neck Distal right M1 MCA occlusion.  Post IR CT Hyperdense material in the right temporal and frontal lobe, which appears similar to the post thrombectomy CT and most likely represents contrast staining, although superimposed hemorrhage cannot be excluded. Redemonstrated hypodensity in the bilateral frontal lobes, right parietal lobe, and right occipital lobe, consistent with infarcts. Previously noted cerebellar hypodensity is less conspicuous on this exam.   MRI  Scattered and confluent areas of restricted diffusion in the bilateral cerebral and cerebellar hemispheres, consistent with  acute infarcts, likely embolic.  Small amount of subarachnoid hemorrhage in the right temporoparietal occipital region, bilateral occipital horns and along the tentorium.  MRA  R MCA patent now 2D Echo EF 60-65%  LDL 57 HgbA1c 5.0     Atrial fibrillation RVR Home meds: Eliquis, sotalol Not compliant with eliquis, at least 2 days out of eliquis    Hypotension Hx of HTN Home meds:  amlodipine, metoprolol succinate, benazepril Unstable- was requiring vasopressors Still requiring low dose phenylephrine BP goal 100-160 Long-term BP goal normotensive   Respiratory failure Possible CAP Intubated -> Trached 11/17  Vent management per CCM on precedex On rocephin and azithromycin-now stopped after comfort care.   Hyperlipidemia Home meds:  Atorvastatin 10mg , resumed in hospital LDL 57, goal < 70 Continue statin on discharge   Other Stroke Risk Factors Advanced Age >/= 62  Hx of DVT   Other Active Problems Dysphagia,TF stopped.

## 2022-03-25 NOTE — Progress Notes (Signed)
This chaplain responded to RN-Yancey consult for EOL spiritual care. The chaplain reviewed the chart notes and was updated by RN-Connie before the visit.  The chaplain understands from PMT the Pt. family wishes to proceed with comfort care on Tuesday. The chaplain is appreciative of RN-Connie's additional education on the meaning of comfort care with the family and contact of the Pt. spouse-Kenneth. The chaplain understands Iantha Fallen plans to visit today. Family is not at the bedside at this time.  The chaplain shared a bedside presence and intercessory prayer with the Pt. The Pt. Does not respond to the chaplain's voice and appears to be resting without any distress. The chaplain understands the RN will offer spiritual care presence to the family. Spiritual care is available as needed.  Chaplain Stephanie Acre [phone]

## 2022-03-25 NOTE — Progress Notes (Signed)
STROKE TEAM PROGRESS NOTE   INTERVAL HISTORY Patient is seen in her room with no family at the bedside.  Transitioned to comfort care. Chaplin consulted by RN  Vitals:   04-02-2022 0800 04-02-2022 0900 02-Apr-2022 1100 April 02, 2022 1200  BP:      Pulse: (!) 167 (!) 155    Resp: 10 17 16 15   Temp:      TempSrc:      SpO2: (!) 86% (!) 71% (!) 78% (!) 85%  Weight:      Height:       CBC:  Recent Labs  Lab 03/02/2022 0428 03/15/22 0420  WBC 10.6* 11.9*  HGB 8.1* 8.0*  HCT 25.5* 24.4*  MCV 90.4 88.1  PLT 220 211    Basic Metabolic Panel:  Recent Labs  Lab 03/09/22 1648 03/10/22 0645 03/11/22 1700 02/26/2022 2033 03/15/22 0420  NA  --  139   < > 140 139  K  --  3.9   < > 3.2* 4.2  CL  --  110   < > 103 100  CO2  --  20*   < > 28 30  GLUCOSE  --  153*   < > 178* 111*  BUN  --  13   < > 14 15  CREATININE  --  0.58   < > 0.57 0.66  CALCIUM  --  8.1*   < > 8.2* 8.4*  MG 1.9 1.8   < > 1.7 1.7  PHOS 3.1 2.5  --   --   --    < > = values in this interval not displayed.    Lipid Panel:  Recent Labs  Lab 03/12/22 0327  TRIG 87    HgbA1c:  No results for input(s): "HGBA1C" in the last 168 hours.  Urine Drug Screen: No results for input(s): "LABOPIA", "COCAINSCRNUR", "LABBENZ", "AMPHETMU", "THCU", "LABBARB" in the last 168 hours.  Alcohol Level  No results for input(s): "ETH" in the last 168 hours.   IMAGING past 24 hours No results found.  PHYSICAL EXAM  Temp:  [99.5 F (37.5 C)] 99.5 F (37.5 C) (11/21 1543) Pulse Rate:  [81-175] 155 (11/22 0900) Resp:  [6-29] 15 (11/22 1200) BP: (107-163)/(41-77) 107/55 (11/22 0600) SpO2:  [71 %-100 %] 85 % (11/22 1200) FiO2 (%):  [21 %-40 %] 21 % (11/22 0756)  General - off vent. NAD. No response to voice today. PERRL.   ASSESSMENT/PLAN Ms. KARYSA HEFT is a 76 y.o. female with history of DVT, HTN, HLD, Afibb on Eliquis who presents with sudden onset left-sided weakness, right gaze deviation. Mechanical thrombectomy done  11/12 with TICI2c revascularization. Remained intubated post procedure, MRI done and shows bilateral hemisphere strokes. According the the patients daughter she started feeling poorly on Saturday and likely stopped taking her medicine that day, including her Eliquis.  She has received a tracheostomy on 11/17.  There was a plan for PEG tube, but patient's family opted for comfort care.   Stroke: right MCA large infarct with scattered areas of infarct in bilateral cerebral and cerebellar with right M1 occlusion s/p IR with TICI3, likely embolic due to afib non compliant with eliquis  Code Stroke CT head- findings concerning for acute infarcts in bilateral frontal lobes, right parietal and occipital lobes and bilateral cerebellum.  CTA head & neck Distal right M1 MCA occlusion.  Post IR CT Hyperdense material in the right temporal and frontal lobe, which appears similar to the post thrombectomy CT and most likely  represents contrast staining, although superimposed hemorrhage cannot be excluded. Redemonstrated hypodensity in the bilateral frontal lobes, right parietal lobe, and right occipital lobe, consistent with infarcts. Previously noted cerebellar hypodensity is less conspicuous on this exam.  MRI  Scattered and confluent areas of restricted diffusion in the bilateral cerebral and cerebellar hemispheres, consistent with acute infarcts, likely embolic.  Small amount of subarachnoid hemorrhage in the right temporoparietal occipital region, bilateral occipital horns and along the tentorium.  MRA  R MCA patent now 2D Echo EF 60-65%  LDL 57 HgbA1c 5.0 VTE prophylaxis - heparin subq Eliquis (apixaban) daily prior to admission, on heparin IV.  Therapy recommendations:  SNF Disposition:  pending  Atrial fibrillation RVR Home meds: Eliquis, sotalol Not compliant with eliquis, at least 2 days out of eliquis RVR overnight, HR up to 180s->150s - cardiology on board On p.o. amiodarone S/p high risk  TEE/DCCV 03/09/22 Now in and out of afib but rate controlled On heparin IV  Hypotension Hx of HTN Home meds:  amlodipine, metoprolol succinate, benazepril Unstable- was requiring vasopressors Still requiring low dose phenylephrine BP goal 100-160 Long-term BP goal normotensive  Respiratory failure Possible CAP Intubated -> Trached 11/17  Vent management per CCM on precedex On rocephin and azithromycin-now stopped  Hyperlipidemia Home meds:  Atorvastatin 10mg , resumed in hospital LDL 57, goal < 70 Continue statin on discharge  Other Stroke Risk Factors Advanced Age >/= 51  Hx of DVT  Other Active Problems Dysphagia,TF stopped.    Hospital day # [place] Lena Gores,MD     To contact Stroke Continuity provider, please refer to [email or web address] After hours, contact General Neurology

## 2022-03-25 NOTE — Progress Notes (Signed)
Nutrition Brief Note  Chart reviewed. Pt now transitioning to comfort care.  No further nutrition interventions planned at this time.  Please re-consult as needed.   Ceriah Kohler P., RD, LDN, CNSC See AMiON for contact information    

## 2022-03-25 NOTE — Progress Notes (Signed)
Pt. Vitals monitor off at this time.

## 2022-03-25 NOTE — Progress Notes (Signed)
Palliative Medicine Progress Note   Patient Name: Sherry Olson       Date: 04/05/22 DOB: 1945-08-09  Age: 76 y.o. MRN#: [id number] Attending Physician: Stroke, Md, MD Primary Care Physician: Perrin Maltese, MD Admit Date: 03/01/2022  Reason for Consultation/Follow-up: end of life care, symptom management  HPI/Patient Profile: 76 y.o. female  with past medical history of atrial fibrillation on Eliquis, DVT, HTN, and HLD who presented to [facility] ED as a code stroke on 03/22/2022 with sudden onset left-sided weakness and right gaze deviation.  CT head with findings concerning for acute infarcts in bilateral frontal lobes, right parietal and occipital lobes, and bilateral cerebellum.  CTA head and neck with distal right M1 MCA occlusion. She underwent thrombectomy with revascularization and remained intubated postprocedure.  Admitted to stroke service.  She was trached 11/17.  Palliative Medicine has been consulted for goals of care. Patient was transitioned to comfort care 11/21.  Subjective: Chart reviewed, update received from RN, and patient assessed at bedside. She appears comfortable. Morphine infusion is at 5 mg/hr. Patient appears comfortable. She is unresponsive to voice and light touch. No non-verbal signs of pain or discomfort noted. Respirations are even and unlabored. No excessive respiratory secretions noted.   Her husband and his brother are present at bedside. Education and counseling provided on natural trajectory at end-of-life. They do not have additional questions or concerns at this time. Emotional support provided. Husband confirms that he has deferred decision making to his daughter Lynelle Smoke.   I later spoke with [name] by phone. I provided reassurance that patient was comfortable  and peaceful. [name] requests that patient be transferred to hospice facility in [place]. I reached out to [facility] to make this referral, but shortly thereafter was notified by RN that patient has passed. Time of death 1346.      Objective:  Physical Exam Vitals reviewed.  Constitutional:      General: She is not in acute distress.    Appearance: She is ill-appearing.  Pulmonary:     Effort: Pulmonary effort is normal.     Comments: Trach collar Neurological:     Mental Status: She is unresponsive.             Vital Signs: BP (!) 107/55   Pulse (!) 155   Temp 99.5 F (37.5 C) (  Axillary)   Resp 17   Ht 5\' 3"  (1.6 m)   Wt 60.4 kg   SpO2 (!) 71%   BMI 23.59 kg/m  SpO2: SpO2: (!) 71 % O2 Device: O2 Device: Tracheostomy Collar O2 Flow Rate: O2 Flow Rate (L/min): 5 L/min     Palliative Assessment/Data: PPS 10%     Palliative Medicine Assessment & Plan   Assessment: Principal Problem:   Acute right MCA stroke (HCC) Active Problems:   Middle cerebral artery embolism, right   Atrial fibrillation with RVR (HCC)   Community acquired pneumonia of right upper lobe of lung   Malnutrition of moderate degree    Goals of Care and Additional Recommendations: Limitations on Scope of Treatment: Full Comfort Care  Code Status: DNR/DNI  Prognosis:  Hours - Days    Thank you for allowing the Palliative Medicine Team to assist in the care of this patient.   MDM - High   , NP   Please contact Palliative Medicine Team phone at [phone] for questions and concerns.  For individual providers, please see AMION.

## 2022-03-25 NOTE — TOC Initial Note (Signed)
Transition of Care [facility]) - Initial/Assessment Note    Patient Details  Name: Sherry Olson MRN: [id number] Date of Birth: [birth year]  [facility] CM/SW Contact:    Sherry Latin, LCSW Phone Number: 02/26/2022, 1:29 PM  Clinical Narrative:                 CSW received consult for residential hospice placement with family preference of [place] location. CSW sent referral to [facility] for review.   Expected Discharge Plan: Hospice Medical Facility Barriers to Discharge: Hospice Bed not available   Patient Goals and CMS Choice Patient states their goals for this hospitalization and ongoing recovery are:: Comfort CMS Medicare.gov Compare Post Acute Care list provided to:: Patient Represenative (must comment) Choice offered to / list presented to : Spouse, Adult Children  Expected Discharge Plan and Services Expected Discharge Plan: Hospice Medical Facility In-house Referral: Clinical Social Work, Hospice / Palliative Care   Post Acute Care Choice: Residential Hospice Bed Living arrangements for the past 2 months: Single Family Home                                      Prior Living Arrangements/Services Living arrangements for the past 2 months: Single Family Home Lives with:: Spouse Patient language and need for interpreter reviewed:: Yes        Need for Family Participation in Patient Care: Yes (Comment) Care giver support system in place?: Yes (comment)   Criminal Activity/Legal Involvement Pertinent to Current Situation/Hospitalization: No - Comment as needed  Activities of Daily Living      Permission Sought/Granted Permission sought to share information with : Facility Medical sales representative, Family Supports Permission granted to share information with : No  Share Information with NAME: Sherry Olson  Permission granted to share info w AGENCY: Hospice  Permission granted to share info w Relationship: Daughter  Permission granted to share info w  Contact Information: [phone]  Emotional Assessment Appearance:: Appears stated age Attitude/Demeanor/Rapport: Unable to Assess Affect (typically observed): Unable to Assess Orientation: :  (Disoriented x4) Alcohol / Substance Use: Not Applicable Psych Involvement: No (comment)  Admission diagnosis:  Acute right MCA stroke (HCC) [I63.511] Middle cerebral artery embolism, right [I66.01] Patient Active Problem List   Diagnosis Date Noted   Atrial fibrillation with RVR (HCC) 03/08/2022   Community acquired pneumonia of right upper lobe of lung 03/08/2022   Malnutrition of moderate degree 03/08/2022   Acute right MCA stroke (HCC) 03/15/2022   Middle cerebral artery embolism, right 03/05/2022   Hypertension 11/10/2017   DVT (deep venous thrombosis) (HCC) 11/10/2017   PCP:  Sherry Loveless, MD Pharmacy:   [facility] - [place], [place] - [address] [place] [address] Phone: [phone] Fax: [phone]     Social Determinants of Health (SDOH) Interventions    Readmission Risk Interventions     No data to display

## 2022-03-25 DEATH — deceased

## 2022-04-27 IMAGING — MG MM DIGITAL SCREENING BILAT W/ TOMO AND CAD
6 of 10 series · 6 of 30 positions shown · non-contrast
Comparison: Previous exam(s).

CLINICAL DATA: Screening.

EXAM:
DIGITAL SCREENING BILATERAL MAMMOGRAM WITH TOMOSYNTHESIS AND CAD
TECHNIQUE: Bilateral screening digital craniocaudal and mediolateral oblique
mammograms were obtained. Bilateral screening digital breast
tomosynthesis was performed. The images were evaluated with
computer-aided detection.

[R MLO synth-2D]
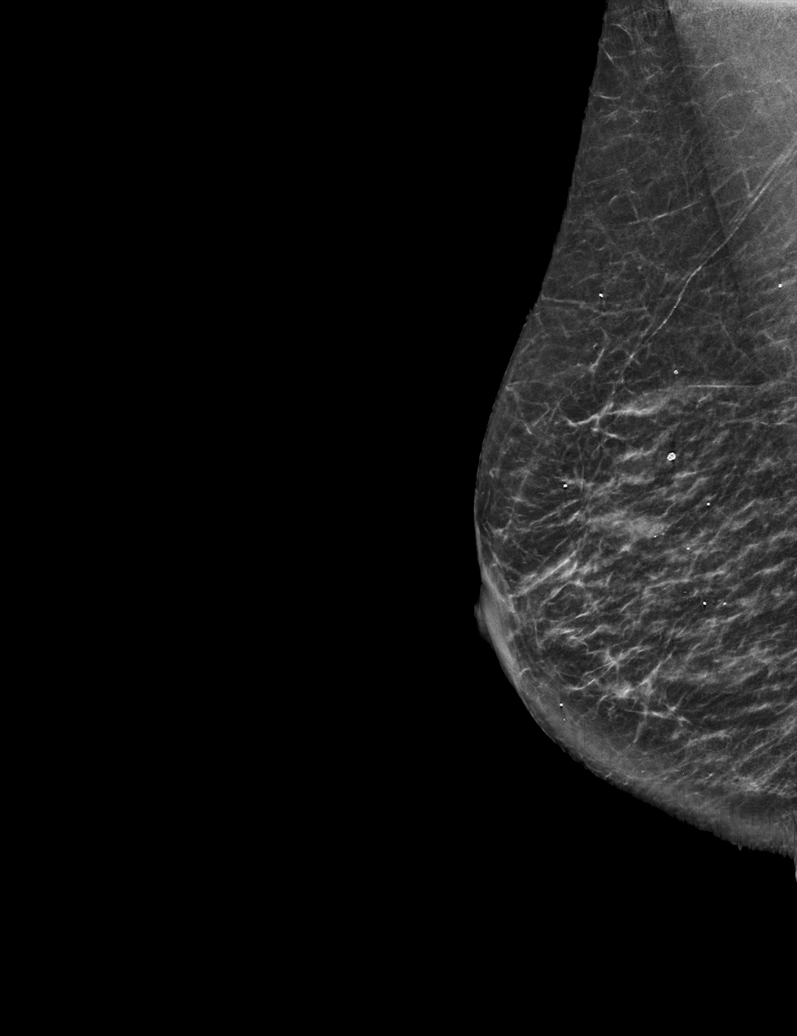

[L MLO synth-2D]
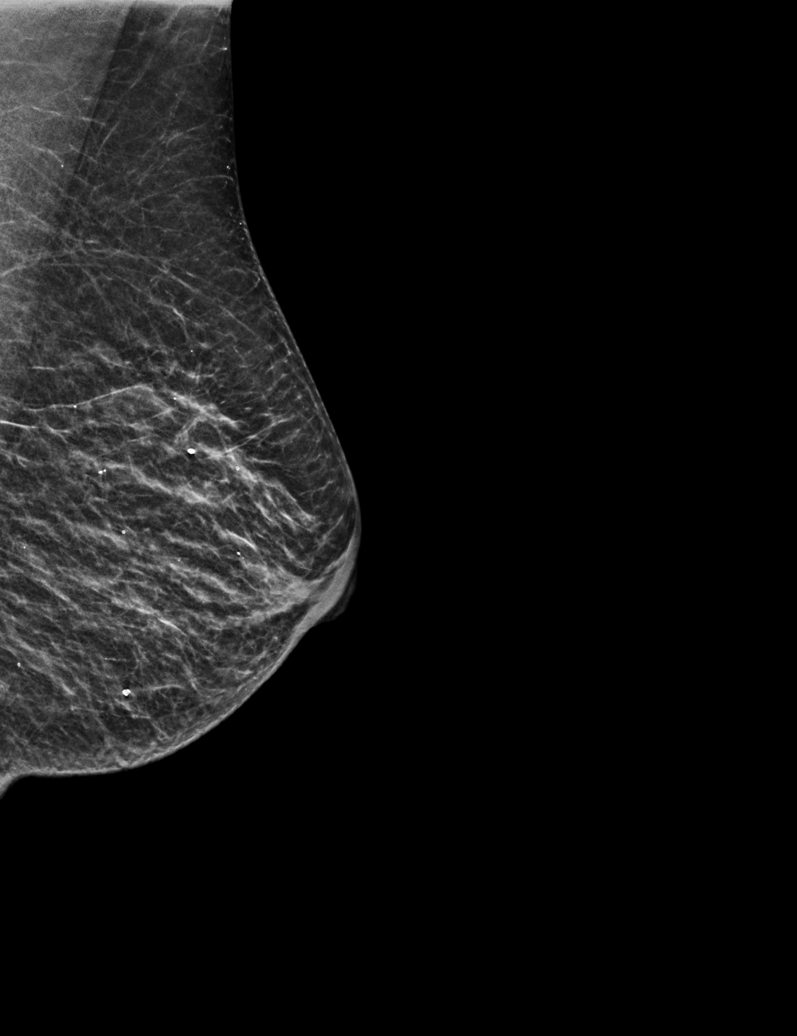

[L CC synth-2D]
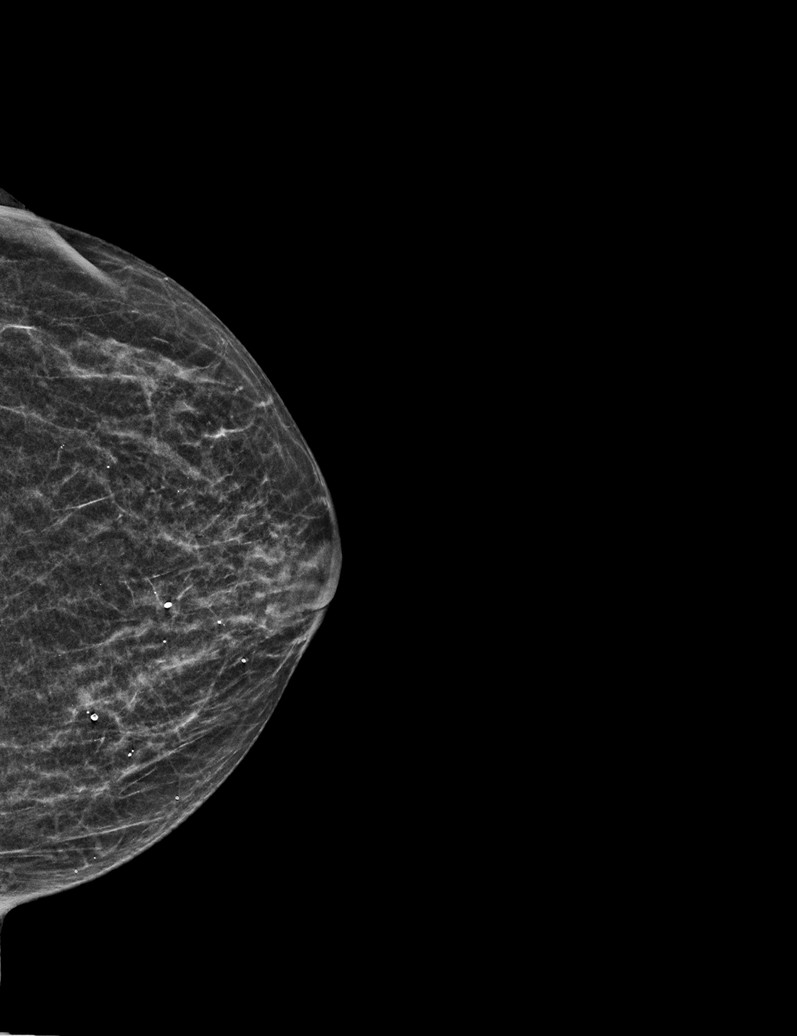

[R CC synth-2D (1 of 2)]
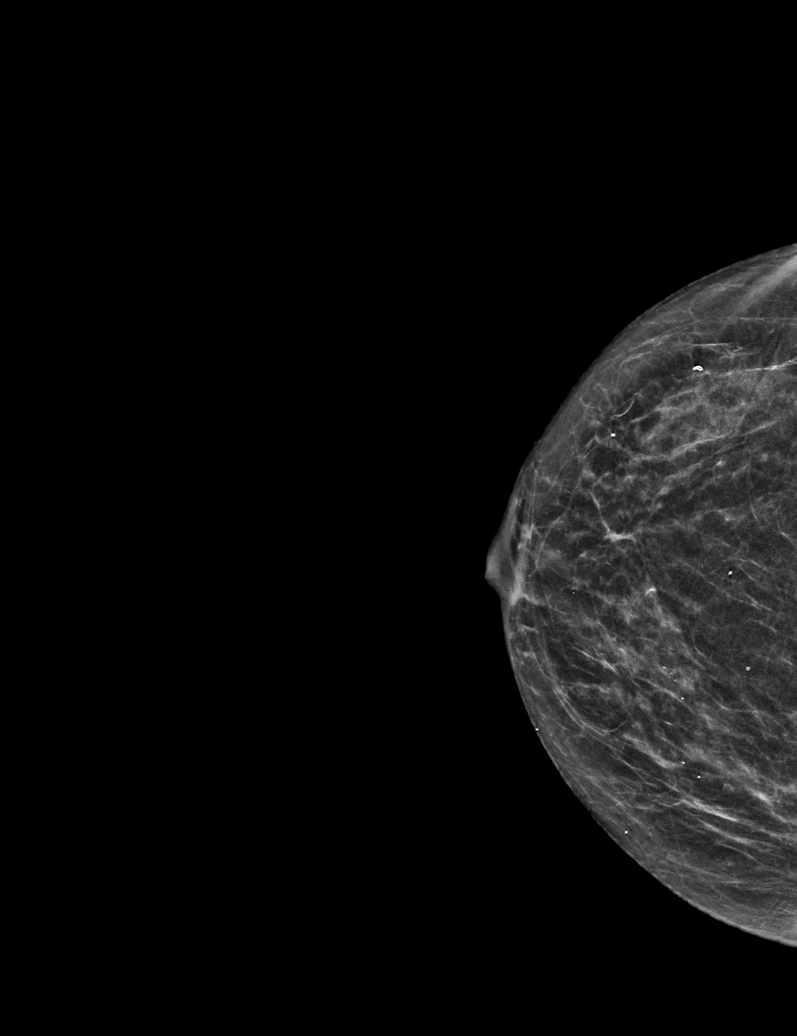

[R CC synth-2D (2 of 2)]
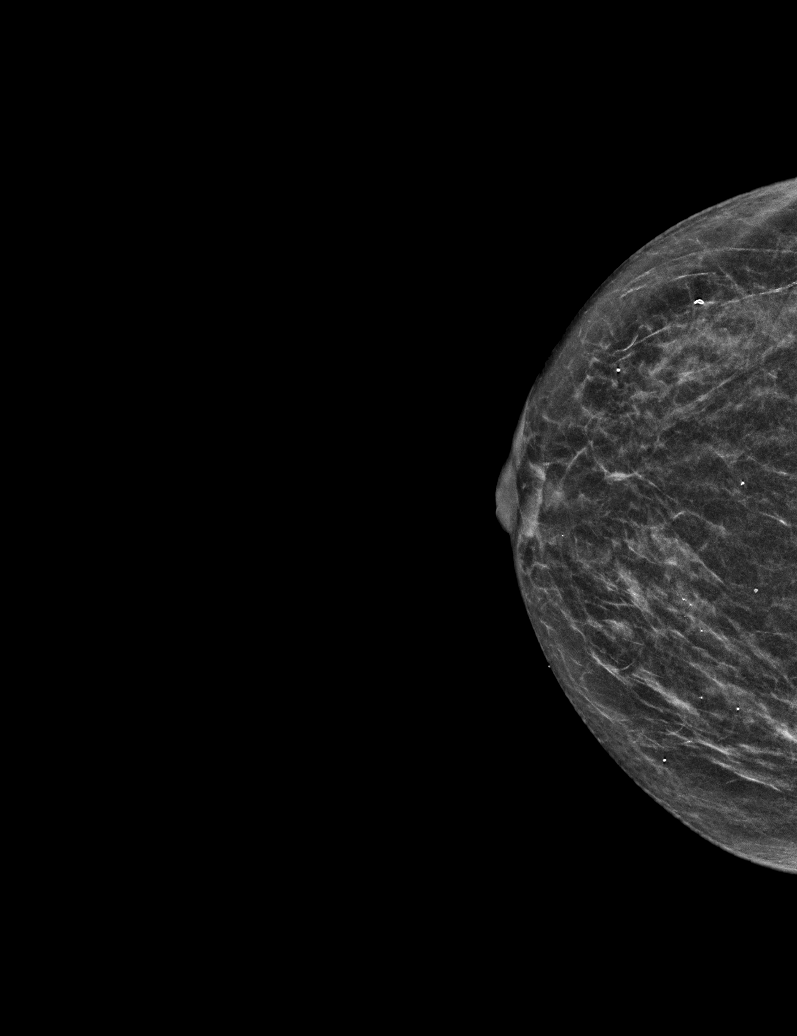

[L CC tomo · tomo slice 17/33.0]
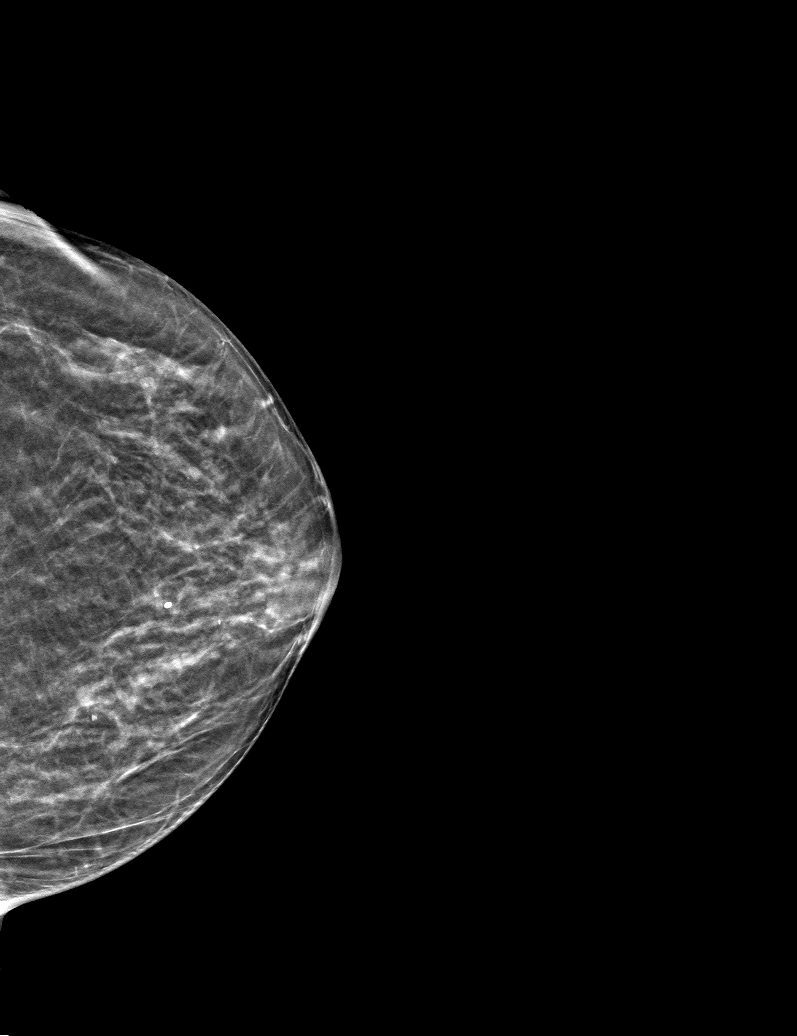

[6 of 30 positions shown; findings below may reference images not displayed]

ACR Breast Density Category b: There are scattered areas of
fibroglandular density.
FINDINGS: There are no findings suspicious for malignancy.
IMPRESSION: No mammographic evidence of malignancy. A result letter of this
screening mammogram will be mailed directly to the patient.

RECOMMENDATION:
Screening mammogram in one year. (Code:51-O-LD2)

BI-RADS CATEGORY  1: Negative.
